# Patient Record
Sex: Female | Born: 1950 | Race: White | Hispanic: No | Marital: Married | Smoking: Never smoker
Health system: Southern US, Community
[De-identification: ages and names within clinical notes are randomized; demographics above are authoritative.]

## PROBLEM LIST (undated history)

## (undated) ENCOUNTER — Emergency Department: Admission: EM | Payer: Medicare Other | Source: Home / Self Care

## (undated) DIAGNOSIS — L309 Dermatitis, unspecified: Secondary | ICD-10-CM

## (undated) DIAGNOSIS — R011 Cardiac murmur, unspecified: Secondary | ICD-10-CM

## (undated) DIAGNOSIS — T7840XA Allergy, unspecified, initial encounter: Secondary | ICD-10-CM

## (undated) DIAGNOSIS — I10 Essential (primary) hypertension: Secondary | ICD-10-CM

## (undated) DIAGNOSIS — K219 Gastro-esophageal reflux disease without esophagitis: Secondary | ICD-10-CM

## (undated) DIAGNOSIS — F32A Depression, unspecified: Secondary | ICD-10-CM

## (undated) DIAGNOSIS — F988 Other specified behavioral and emotional disorders with onset usually occurring in childhood and adolescence: Secondary | ICD-10-CM

## (undated) DIAGNOSIS — F419 Anxiety disorder, unspecified: Secondary | ICD-10-CM

## (undated) DIAGNOSIS — I639 Cerebral infarction, unspecified: Secondary | ICD-10-CM

## (undated) DIAGNOSIS — J45909 Unspecified asthma, uncomplicated: Secondary | ICD-10-CM

## (undated) DIAGNOSIS — E785 Hyperlipidemia, unspecified: Secondary | ICD-10-CM

## (undated) DIAGNOSIS — M109 Gout, unspecified: Secondary | ICD-10-CM

## (undated) HISTORY — PX: TONSILLECTOMY: SUR1361

## (undated) HISTORY — DX: Cerebral infarction, unspecified: I63.9

## (undated) HISTORY — PX: BREAST BIOPSY: SHX20

## (undated) HISTORY — DX: Hyperlipidemia, unspecified: E78.5

## (undated) HISTORY — DX: Gastro-esophageal reflux disease without esophagitis: K21.9

## (undated) HISTORY — PX: OTHER SURGICAL HISTORY: SHX169

## (undated) HISTORY — DX: Depression, unspecified: F32.A

## (undated) HISTORY — DX: Dermatitis, unspecified: L30.9

## (undated) HISTORY — DX: Essential (primary) hypertension: I10

## (undated) HISTORY — DX: Cardiac murmur, unspecified: R01.1

## (undated) HISTORY — DX: Unspecified asthma, uncomplicated: J45.909

## (undated) HISTORY — DX: Allergy, unspecified, initial encounter: T78.40XA

## (undated) HISTORY — PX: REPLACEMENT TOTAL KNEE BILATERAL: SUR1225

## (undated) HISTORY — DX: Anxiety disorder, unspecified: F41.9

## (undated) HISTORY — DX: Other specified behavioral and emotional disorders with onset usually occurring in childhood and adolescence: F98.8

## (undated) HISTORY — DX: Gout, unspecified: M10.9

## (undated) HISTORY — PX: TOTAL KNEE ARTHROPLASTY: SHX125

---
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
## 1978-06-27 HISTORY — PX: BREAST BIOPSY: SHX20

## 1997-07-28 ENCOUNTER — Ambulatory Visit
Admit: 1997-07-28 | Disposition: A | Discharge status: Home / Self Care | Payer: Self-pay | Source: Ambulatory Visit | Admitting: Family Medicine

## 2001-09-27 ENCOUNTER — Ambulatory Visit
Admit: 2001-09-27 | Disposition: A | Discharge status: Home / Self Care | Payer: Self-pay | Source: Ambulatory Visit | Admitting: Adult Reconstructive Orthopaedic Surgery

## 2002-02-05 ENCOUNTER — Ambulatory Visit
Admit: 2002-02-05 | Disposition: A | Discharge status: Home / Self Care | Payer: Self-pay | Source: Ambulatory Visit | Admitting: Anesthesiology

## 2002-03-11 ENCOUNTER — Ambulatory Visit
Admit: 2002-03-11 | Disposition: A | Discharge status: Home / Self Care | Payer: Self-pay | Source: Ambulatory Visit | Admitting: Anesthesiology

## 2002-05-02 ENCOUNTER — Emergency Department: Admit: 2002-05-02 | Payer: Self-pay | Source: Emergency Department | Admitting: Emergency Medicine

## 2002-07-02 ENCOUNTER — Ambulatory Visit
Admit: 2002-07-02 | Disposition: A | Discharge status: Home / Self Care | Payer: Self-pay | Source: Ambulatory Visit | Admitting: Adult Reconstructive Orthopaedic Surgery

## 2003-06-12 ENCOUNTER — Ambulatory Visit: Admit: 2003-06-12 | Disposition: A | Discharge status: Home / Self Care | Payer: Self-pay | Source: Ambulatory Visit

## 2003-08-01 ENCOUNTER — Ambulatory Visit
Admit: 2003-08-01 | Disposition: A | Discharge status: Home / Self Care | Payer: Self-pay | Source: Ambulatory Visit | Admitting: Neurology

## 2005-08-07 ENCOUNTER — Emergency Department: Admit: 2005-08-07 | Payer: Self-pay | Source: Emergency Department

## 2005-08-07 LAB — BASIC METABOLIC PANEL
BUN: 19 MG/DL (ref 7–21)
CO2: 30 MEQ/L (ref 22–31)
Calcium: 9.6 MG/DL (ref 8.6–10.2)
Chloride: 100 MEQ/L (ref 98–107)
Creatinine: 1.2 MG/DL (ref 0.5–1.4)
Glucose: 93 MG/DL (ref 65–110)
Potassium: 5 MEQ/L (ref 3.6–5.0)
Sodium: 138 MEQ/L (ref 136–143)

## 2005-08-07 LAB — URINALYSIS WITH MICROSCOPIC
Bilirubin, UA: NEGATIVE
Glucose, UA: NEGATIVE
Ketones UA: NEGATIVE
Leukocyte Esterase, UA: NEGATIVE
Nitrite, UA: NEGATIVE
Protein, UR: NEGATIVE
Specific Gravity UA POCT: <=1.005 (ref <=1.030)
Urine pH: 6.5 (ref 5.0–8.0)
Urobilinogen, UA: 0.2

## 2005-08-07 LAB — CBC WITH AUTO DIFFERENTIAL CERNER
Basophils Absolute: 0.1 /mm3 (ref 0.0–0.2)
Basophils: 1 % (ref 0–2)
Eosinophils Absolute: 0.3 /mm3 (ref 0.0–0.7)
Eosinophils: 4 % (ref 0–5)
Granulocytes Absolute: 4.1 /mm3 (ref 1.8–8.1)
Hematocrit: 39.5 % (ref 37.0–47.0)
Hgb: 13.2 G/DL (ref 12.0–16.0)
Lymphocytes Absolute: 2.4 /mm3 (ref 0.5–4.4)
Lymphocytes: 32 % (ref 15–41)
MCH: 31.7 PG (ref 28.0–32.0)
MCHC: 33.5 G/DL (ref 32.0–36.0)
MCV: 95 FL (ref 80–100)
MPV: 7.4 FL (ref 7.4–10.4)
Monocytes Absolute: 0.7 /mm3 (ref 0.0–1.2)
Monocytes: 9 % (ref 0–11)
Neutrophils %: 54 % (ref 52–75)
Platelets: 318 /mm3 (ref 140–400)
RBC: 4.17 /mm3 — ABNORMAL LOW (ref 4.20–5.40)
RDW: 11.7 % (ref 11.5–15.0)
WBC: 7.6 /mm3 (ref 3.5–10.8)

## 2005-08-07 LAB — HEPATIC FUNCTION PANEL
ALT: 34 U/L (ref 4–36)
AST (SGOT): 16 U/L (ref 10–41)
Albumin/Globulin Ratio: 1.7 (ref 1.1–1.8)
Albumin: 4 G/DL (ref 3.4–4.9)
Alkaline Phosphatase: 79 U/L (ref 43–112)
Bilirubin Direct: 0 MG/DL — AB (ref 0.0–0.3)
Bilirubin Indirect: 0.6 MG/DL (ref 0.0–1.1)
Bilirubin, Total: 0.5 MG/DL (ref 0.2–1.0)
Globulin: 2.4 G/DL (ref 2.0–3.7)
Protein, Total: 6.4 G/DL (ref 6.0–8.0)

## 2005-08-08 LAB — TSH: TSH: 0.687 u[IU]/mL (ref 0.465–4.680)

## 2005-08-08 LAB — T4: T4: 5.8 ug/dL (ref 4.2–11.0)

## 2007-01-10 ENCOUNTER — Emergency Department: Admit: 2007-01-10 | Payer: Self-pay | Source: Emergency Department | Admitting: Internal Medicine

## 2007-07-23 ENCOUNTER — Inpatient Hospital Stay
Admission: RE | Admit: 2007-07-23 | Disposition: A | Discharge status: Home Health | Payer: Self-pay | Source: Ambulatory Visit | Admitting: Adult Reconstructive Orthopaedic Surgery

## 2007-07-24 LAB — BASIC METABOLIC PANEL
BUN: 10 mg/dL (ref 8–20)
CO2: 27 mEq/L (ref 21–30)
Calcium: 8.5 mg/dL — ABNORMAL LOW (ref 8.6–10.2)
Chloride: 103 mEq/L (ref 98–107)
Creatinine: 1 mg/dL (ref 0.6–1.5)
Glucose: 110 mg/dL — ABNORMAL HIGH (ref 70–100)
Potassium: 5 mEq/L (ref 3.6–5.0)
Sodium: 137 mEq/L (ref 136–146)

## 2007-07-24 LAB — TYPE AND XMATCH - IFH ONLY CERNER: AB Screen Gel: NEGATIVE

## 2007-07-24 LAB — HEMOGLOBIN AND HEMATOCRIT, BLOOD
Hematocrit: 31.2 % — ABNORMAL LOW (ref 37.0–47.0)
Hgb: 9.8 G/DL — ABNORMAL LOW (ref 12.0–16.0)

## 2007-07-24 LAB — GFR

## 2007-07-25 LAB — BASIC METABOLIC PANEL
BUN: 10 mg/dL (ref 8–20)
CO2: 25 mEq/L (ref 21–30)
Calcium: 9 mg/dL (ref 8.6–10.2)
Chloride: 104 mEq/L (ref 98–107)
Creatinine: 1 mg/dL (ref 0.6–1.5)
Glucose: 85 mg/dL (ref 70–100)
Potassium: 4.1 mEq/L (ref 3.6–5.0)
Sodium: 139 mEq/L (ref 136–146)

## 2007-07-25 LAB — HEMOGLOBIN AND HEMATOCRIT, BLOOD
Hematocrit: 30.1 % — ABNORMAL LOW (ref 37.0–47.0)
Hgb: 9.5 G/DL — ABNORMAL LOW (ref 12.0–16.0)

## 2007-07-25 LAB — GFR

## 2007-07-26 LAB — HEMOGLOBIN AND HEMATOCRIT, BLOOD
Hematocrit: 28.2 % — ABNORMAL LOW (ref 37.0–47.0)
Hgb: 9.1 G/DL — ABNORMAL LOW (ref 12.0–16.0)

## 2007-08-07 ENCOUNTER — Emergency Department: Admit: 2007-08-07 | Payer: Self-pay | Source: Emergency Department | Admitting: Emergency Medicine

## 2007-08-07 LAB — MANUAL DIFFERENTIAL CERNER
Bands: 1 % (ref 0–9)
Lymphocytes Manual: 17 % (ref 15–41)
Monocytes Manual: 14 % — ABNORMAL HIGH (ref 0–11)
Neutrophils %: 68 % (ref 52–75)
RBC Morphology: NORMAL

## 2007-08-07 LAB — CBC AND DIFFERENTIAL
Hematocrit: 33 % — ABNORMAL LOW (ref 37.0–47.0)
Hgb: 10.8 G/DL — ABNORMAL LOW (ref 12.0–16.0)
MCH: 32.4 PG — ABNORMAL HIGH (ref 28.0–32.0)
MCHC: 32.7 G/DL (ref 32.0–36.0)
MCV: 99.1 FL (ref 80.0–100.0)
MPV: 9.1 FL — ABNORMAL LOW (ref 9.4–12.3)
Platelets: 433 /mm3 — ABNORMAL HIGH (ref 140–400)
RBC: 3.33 /mm3 — ABNORMAL LOW (ref 4.20–5.40)
RDW: 12.6 % (ref 11.5–15.0)
WBC: 6.43 /mm3 (ref 3.50–10.80)

## 2007-08-07 LAB — BASIC METABOLIC PANEL
BUN: 18 MG/DL (ref 7–21)
CO2: 30 MEQ/L (ref 22–31)
Calcium: 9.5 MG/DL (ref 8.6–10.2)
Chloride: 97 MEQ/L — ABNORMAL LOW (ref 98–107)
Creatinine: 1.1 MG/DL (ref 0.5–1.4)
Glucose: 86 MG/DL (ref 65–110)
Potassium: 4.3 MEQ/L (ref 3.6–5.0)
Sodium: 137 MEQ/L (ref 136–143)

## 2008-10-09 ENCOUNTER — Ambulatory Visit
Admit: 2008-10-09 | Disposition: A | Discharge status: Home / Self Care | Payer: Self-pay | Source: Ambulatory Visit | Admitting: Adult Reconstructive Orthopaedic Surgery

## 2009-01-13 LAB — TYPE AND SCREEN: AB Screen Gel: NEGATIVE

## 2009-01-19 ENCOUNTER — Inpatient Hospital Stay
Admission: RE | Admit: 2009-01-19 | Disposition: A | Discharge status: Home Health | Payer: Self-pay | Source: Ambulatory Visit | Admitting: Adult Reconstructive Orthopaedic Surgery

## 2009-01-19 LAB — URINALYSIS
Bilirubin, UA: NEGATIVE
Glucose, UA: NEGATIVE
Ketones UA: NEGATIVE
Leukocyte Esterase, UA: NEGATIVE
Nitrite, UA: NEGATIVE
Specific Gravity UA POCT: 1.025 (ref 1.001–1.035)
Urine pH: 6 (ref 5.0–8.0)
Urobilinogen, UA: NORMAL mg/dL

## 2009-01-20 LAB — BASIC METABOLIC PANEL
BUN: 13 mg/dL (ref 8–20)
CO2: 26 mEq/L (ref 21–30)
Calcium: 8.7 mg/dL (ref 8.6–10.2)
Chloride: 101 mEq/L (ref 98–107)
Creatinine: 0.7 mg/dL (ref 0.6–1.5)
Glucose: 125 mg/dL — ABNORMAL HIGH (ref 70–100)
Potassium: 4.7 mEq/L (ref 3.6–5.0)
Sodium: 134 mEq/L — ABNORMAL LOW (ref 136–146)

## 2009-01-20 LAB — HEMOGLOBIN AND HEMATOCRIT, BLOOD
Hematocrit: 31 % — ABNORMAL LOW (ref 37.0–47.0)
Hgb: 10.1 G/DL — ABNORMAL LOW (ref 12.0–16.0)

## 2009-01-20 LAB — GFR

## 2009-01-21 LAB — HEMOGLOBIN AND HEMATOCRIT, BLOOD
Hematocrit: 33.8 % — ABNORMAL LOW (ref 37.0–47.0)
Hgb: 10.7 G/DL — ABNORMAL LOW (ref 12.0–16.0)

## 2009-01-21 LAB — BASIC METABOLIC PANEL
BUN: 9 mg/dL (ref 8–20)
CO2: 30 mEq/L (ref 21–30)
Calcium: 9.3 mg/dL (ref 8.6–10.2)
Chloride: 104 mEq/L (ref 98–107)
Creatinine: 0.9 mg/dL (ref 0.6–1.5)
Glucose: 102 mg/dL — ABNORMAL HIGH (ref 70–100)
Potassium: 4.4 mEq/L (ref 3.6–5.0)
Sodium: 143 mEq/L (ref 136–146)

## 2009-01-21 LAB — GFR

## 2009-02-08 ENCOUNTER — Emergency Department: Admit: 2009-02-08 | Payer: Self-pay | Source: Emergency Department | Admitting: Emergency Medicine

## 2009-02-08 LAB — URINALYSIS WITH MICROSCOPIC
Bilirubin, UA: NEGATIVE
Glucose, UA: NEGATIVE
Ketones UA: NEGATIVE
Leukocyte Esterase, UA: NEGATIVE
Nitrite, UA: NEGATIVE
Protein, UR: NEGATIVE
Specific Gravity UA POCT: 1.01 (ref <=1.030)
Urine pH: 5.5 (ref 5.0–8.0)
Urobilinogen, UA: 0.2

## 2009-02-08 LAB — CBC AND DIFFERENTIAL
Basophils Absolute: 0 /mm3 (ref 0.0–0.2)
Basophils: 1 % (ref 0–2)
Eosinophils Absolute: 0.1 /mm3 (ref 0.0–0.7)
Eosinophils: 1 % (ref 0–5)
Granulocytes Absolute: 4.2 /mm3 (ref 1.8–8.1)
Hematocrit: 37.2 % (ref 37.0–47.0)
Hgb: 12.4 G/DL (ref 12.0–16.0)
Lymphocytes Absolute: 1 /mm3 (ref 0.5–4.4)
Lymphocytes: 17 % (ref 15–41)
MCH: 31.6 PG (ref 28.0–32.0)
MCHC: 33.3 G/DL (ref 32.0–36.0)
MCV: 94.7 FL (ref 80.0–100.0)
MPV: 9.3 FL — ABNORMAL LOW (ref 9.4–12.3)
Monocytes Absolute: 0.6 /mm3 (ref 0.0–1.2)
Monocytes: 11 % (ref 0–11)
Neutrophils %: 71 % (ref 52–75)
Platelets: 440 /mm3 — ABNORMAL HIGH (ref 140–400)
RBC: 3.93 /mm3 — ABNORMAL LOW (ref 4.20–5.40)
RDW: 12.5 % (ref 11.5–15.0)
WBC: 5.93 /mm3 (ref 3.50–10.80)

## 2009-02-08 LAB — COMPREHENSIVE METABOLIC PANEL
ALT: 17 U/L (ref 4–36)
AST (SGOT): 20 U/L (ref 10–41)
Albumin/Globulin Ratio: 1.2 (ref 1.1–1.8)
Albumin: 3.8 G/DL (ref 3.4–4.9)
Alkaline Phosphatase: 95 U/L (ref 43–112)
BUN: 11 MG/DL (ref 7–21)
Bilirubin, Total: 0.6 MG/DL (ref 0.2–1.0)
CO2: 25 MEQ/L (ref 22–31)
Calcium: 10.1 MG/DL (ref 8.6–10.2)
Chloride: 102 MEQ/L (ref 98–107)
Creatinine: 1 MG/DL (ref 0.5–1.4)
Globulin: 3.2 G/DL (ref 2.0–3.7)
Glucose: 125 MG/DL — ABNORMAL HIGH (ref 65–110)
Potassium: 3.8 MEQ/L (ref 3.6–5.0)
Protein, Total: 7 G/DL (ref 6.0–8.0)
Sodium: 140 MEQ/L (ref 136–143)

## 2009-02-08 LAB — AMYLASE: Amylase: 53 U/L (ref 30–110)

## 2009-02-08 LAB — LIPASE: Lipase: 302 U/L — ABNORMAL HIGH (ref 23–300)

## 2009-11-19 ENCOUNTER — Emergency Department: Admit: 2009-11-19 | Payer: Self-pay | Source: Emergency Department | Admitting: Emergency Medicine

## 2011-03-22 LAB — ECG 12-LEAD
Atrial Rate: 82 {beats}/min
P Axis: 34 degrees
P-R Interval: 116 ms
Q-T Interval: 370 ms
QRS Duration: 70 ms
QTC Calculation (Bezet): 432 ms
R Axis: 30 degrees
T Axis: 29 degrees
Ventricular Rate: 82 {beats}/min

## 2011-04-09 LAB — ECG 12-LEAD
Atrial Rate: 73 {beats}/min
P Axis: 51 degrees
P-R Interval: 114 ms
Q-T Interval: 358 ms
QRS Duration: 72 ms
QTC Calculation (Bezet): 394 ms
R Axis: 30 degrees
T Axis: 50 degrees
Ventricular Rate: 73 {beats}/min

## 2011-04-15 NOTE — Discharge Summary (Unsigned)
DATE OF BIRTH:                        1950/08/07            ADMISSION DATE:                     07/23/2007      DISCHARGE DATE:                     07/26/2007            ATTENDING PHYSICIAN:                  Lou Miner, MD            ADMISSION DIAGNOSIS:  Osteoarthritis of the left knee.            DISCHARGE DIAGNOSIS:  Osteoarthritis of the left knee.            OPERATION:  Knee replacement arthroplasty.            BRIEF SUMMARY:  This 60 year old woman suffered progressive difficulties      with her left knee, which was not responsive to conservative management and      was diagnosed as osteoarthritis.  The patient was admitted to the hospital      for elective knee replacement, which was carried out as detailed in the      operative summary on 07/23/2007.  The patient did well postoperatively, and      was mobilized.  Her pain was brought under control with oral medications.      She was stable metabolically and was comfortable with self-administration      of Lovenox.  Dressing change revealed early satisfactory healing of her      wound on the third postoperative day.  She was therefore discharged home,      ambulatory and comfortable.  The patient remains on Lovenox for a total of      10 days.  She will be seen at the office 2 weeks from surgery for suture      removal.                                          ___________________________________          Date Signed: __________      Lou Miner, MD  ([id number])            D: 07/26/2007 by Lou Miner, MD      T: 07/27/2007 by [id number] ([id number]) (Dorris Carnes: [id number])      cc:  Lou Miner, MD

## 2011-04-15 NOTE — Discharge Summary (Unsigned)
Account Number: [id number]      Document ID: [id number]      Admit Date: 01/19/2009      Discharge Date: 01/21/2009            ATTENDING PHYSICIAN:  Sinclair Grooms, MD                  PREOPERATIVE DIAGNOSIS:      Osteoarthritis, right knee.            POSTOPERATIVE DIAGNOSIS:      Osteoarthritis, right knee.            HOSPITAL COURSE:      This is a 60 year old female admitted to the orthopedic floor after right      total knee replacement.  She progressed well with physical therapy starting      on postoperative day #0.  Her Foley was discontinued on postoperative day      #1, and she was started on anticoagulation that day.  On postoperative day      #2, she was deemed safe and suitable for discharge home with home physical      therapy.  She will follow up with Dr. Lenell Antu in 1 to 2 weeks.            FINAL DIAGNOSIS:      Osteoarthritis, right knee, requiring total knee replacement.                              _______________________________     Date/Time Signed: _____________      Lou Miner MD [phone])            D:  02/11/2009 22:00 PM by Bryon Lions, MD ([id number])      T:  02/12/2009 10:55 AM by [id number]          Everlean Cherry: [id number]) (Doc ID: [id number])                        NU:[id number] Kendra Grissett MD

## 2011-04-15 NOTE — Op Note (Unsigned)
DATE OF BIRTH:                        [birth year]      ADMISSION DATE:                     07/23/2007            PATIENT LOCATION:                    [id number]            DATE OF PROCEDURE:                   07/23/2007      SURGEON:                            Lou Miner, MD      ASSISTANT(S):                         Jacklynn Lewis, MD                  PREOPERATIVE DIAGNOSIS:   OSTEOARTHRITIS OF THE LEFT KNEE.            POSTOPERATIVE DIAGNOSIS:  OSTEOARTHRITIS OF THE LEFT KNEE.            PROCEDURE:  KNEE REPLACEMENT ARTHROPLASTY.            DESCRIPTION OF PROCEDURE:  Under satisfactory spinal anesthesia, the      patient was prepped and draped in the usual fashion.  Preoperative      administration of antibiotics was confirmed and the pause was carried out.      The procedure was begun after a suitable prep and drape.  A central      incision was used and a median parapatellar approach to the knee exposed      the joint.  The patient had extensive osteoarthritic changes.  These were      most severe and focused in the patellofemoral joint, but were visible      throughout the knee in the form of degenerative changes on the surface      cartilage, well seen to the naked eye.  Soft tissues were cleared from the      top of the tibia.  Retractors were placed and the center of the tibia was      found with a drill.  A guide rod was placed down the tibia and the tibial      cut was made.  The edges were trimmed so that it was satisfactorily      cleared.  The knee was then reduced and the distal femur was then also cut      transversely at the 0 indicator in 5-degrees of valgus after the center of      the femur was found with medullary rod.  This was checked.  This revealed      satisfactory position of the joint and satisfactory gap for implantation of      the prosthesis.  The distal femur was then shaped appropriately to a 60-mm      size and a trial prosthesis was put in place.  A 67-mm trial tibia was       selected and was adjusted for appropriate rotation.  With this done, the patellofemoral joint was addressed.  The patella      measured about 18-mm in width.  It was thought this was due in part to      erosive changes as seen on the x-ray.  We therefore trimmed the patella to      a flat surface and its low point was not deepened.  This resulted in a      patella of about 13 to 14 mm in width and a satisfactory level contour.  It      was sized for a small prosthesis and was shaped appropriately.  The      retained cortex, which was free of any cartilage, was drilled with a drill      and this was to allow for better implantation with the cement.            With this done, copious irrigation was carried out and the tibia was once      again exposed.  The preparation of the tibia for a 67-mm prosthesis was      completed by using the punch to place the fins and the cement was mixed and      the prosthesis was gathered on the back table.  The prosthesis was      implanted tibia, femur, patella.  A 12-mm tray was used to cure the cement      with the knee in extension and, with this done, the knee was checked.  It      worked best with a 10-mm tray.  We therefore selected that as our permanent      implant.  This was put in and locked in position and the wound was closed      in layers in the usual fashion over a Hemovac drain.  The patient was then      placed in a sterile dressing and was transferred to the recovery room in      satisfactory condition.  She tolerated the procedure well.                                          ___________________________________          Date Signed: __________      Lou Miner, MD  ([id number])            D: 07/23/2007 by Lou Miner, MD      T: 07/23/2007 by [id number] ([id number]) (V:[id number])      cc:  Lou Miner, MD

## 2011-04-15 NOTE — Op Note (Unsigned)
Account Number: [id number]      Document ID: [id number]      Admit Date: 01/19/2009      Procedure Date: 01/19/2009            Patient Location: [id number]      Patient Type: I            SURGEON: Lou Miner MD      ASSISTANT:  Bryon Lions MD                  PREOPERATIVE DIAGNOSIS:      Osteoarthritis of the right knee.            POSTOPERATIVE DIAGNOSIS:      Osteoarthritis of the right knee.            PROCEDURE PERFORMED:      Right knee arthroplasty.            DESCRIPTION OF PROCEDURE:      Under satisfactory spinal anesthesia, the patient was prepped and draped in      the usual fashion.  Preoperative administration of antibiotics was      confirmed and a pause was carried out.  The procedure was begun after      suitable prep and drape.  A central incision was used and a median      parapatellar approach to the knee exposed the joint.  There was evidence of      extensive osteoarthritic changes especially about the patellofemoral joint.       The soft tissues were cleared from the top of the tibia, retractors were      placed and the center of the tibia was found with the drill.  A guide wire      was placed on the tibia and the tibial cut was made.  The edges were      trimmed so that it was satisfactorily cleared.  The knee was then reduced      and the distal femur was also cut transversely at the zero indicator and 5      degrees of valgus after the center of the femur was found the medullary      rod.  This revealed satisfactory position of the joint.  The distal femur      was then shaped appropriately to a 60-mm size and a trial prosthesis was      put in place.  A 67-mm tibial trial was selected and adjusted for      appropriate rotation.  With this done, the patellofemoral joint was      addressed. The patella was measured about 19 mm in width.  It was thought      that this was due, in part, to the erosive changes.  The patella was then      trimmed to a flat surface.  This resulted in a patella of  about 13 to 13 mm      in width and a satisfactory level contour.  It was sized for a small      prosthesis and was shaved appropriately.  The remaining cortex was drilled      and this allowed for better implantation with the cement.  At this point,      the joint was copiously irrigated and all bony surfaces were washed clean.      At this point, the tibia was, once again, exposed.  The tibia was prepared      for  a 67-mm prosthesis.  Cement was mixed.  The cement as well as the      prosthesis was implanted in the tibia, femur and patella.  A 12-mm tray was      used to cure the cement with the knee in extension and _____ and it worked      best with a 12-mm tray; that was selected as the permanent implant.  This      was put in and locked.  The wound was closed in layers in the usual      fashion.  The patient was then dressed in a sterile dressing and      transferred to the recovery room in satisfactory condition.  She tolerated      the procedure well.                              _______________________________     Date/Time Signed: _____________      Lou Miner MD [phone])            D:  01/19/2009 14:54 PM by Bryon Lions, MD ([id number])      T:  01/20/2009 09:02 AM by BP          (Conf: [id number]) (Doc ID: [id number])                  UU:[id number] Breniya Goertzen MD

## 2011-10-26 ENCOUNTER — Other Ambulatory Visit: Payer: Self-pay | Admitting: Specialist

## 2011-10-26 DIAGNOSIS — R410 Disorientation, unspecified: Secondary | ICD-10-CM

## 2011-10-29 ENCOUNTER — Ambulatory Visit
Admission: RE | Admit: 2011-10-29 | Discharge: 2011-10-29 | Disposition: A | Discharge status: Home / Self Care | Payer: BLUE CROSS/BLUE SHIELD | Source: Ambulatory Visit | Attending: Specialist | Admitting: Specialist

## 2011-10-29 DIAGNOSIS — R410 Disorientation, unspecified: Secondary | ICD-10-CM

## 2011-11-12 ENCOUNTER — Ambulatory Visit
Admission: RE | Admit: 2011-11-12 | Discharge: 2011-11-12 | Disposition: A | Discharge status: Home / Self Care | Payer: BLUE CROSS/BLUE SHIELD | Source: Ambulatory Visit | Attending: Specialist | Admitting: Specialist

## 2011-11-12 DIAGNOSIS — F29 Unspecified psychosis not due to a substance or known physiological condition: Secondary | ICD-10-CM | POA: Insufficient documentation

## 2012-03-07 ENCOUNTER — Ambulatory Visit (INDEPENDENT_AMBULATORY_CARE_PROVIDER_SITE_OTHER): Payer: BLUE CROSS/BLUE SHIELD | Admitting: Cardiovascular Disease

## 2012-03-07 ENCOUNTER — Encounter (INDEPENDENT_AMBULATORY_CARE_PROVIDER_SITE_OTHER): Payer: Self-pay | Admitting: Cardiovascular Disease

## 2012-03-07 VITALS — BP 122/70 | HR 80 | Ht 62.0 in | Wt 185.0 lb

## 2012-03-07 DIAGNOSIS — R0602 Shortness of breath: Secondary | ICD-10-CM | POA: Insufficient documentation

## 2012-03-07 NOTE — Progress Notes (Signed)
------------------------------------------------------------------------  Cardiology Outpatient Consult Note  ------------------------------------------------------------------------    Chief Complaint   Patient presents with   . Shortness of Breath   . Fatigue       Referring Physician: Sheran Luz, MD     Date of Consultation: 03/07/2012      OZH:YQMVHQI OF PRESENT ILLNESS:  Rachel Diaz is a 61 year old female referred by Dr. Shayne Diaz for new  excessive shortness of breath and fatigue.  The patient states that she is  now exhausted all the time.  This has been going on for a year.  It occurs  on a daily basis.  She has dyspnea with minimal exertion.  She has never  had typical angina.  She does not have exertional chest pain or chest  pressure.  She has not had PND, orthopnea, or lower extremity edema.  She  has never had symptoms of dysrhythmias.  She does not exercise on a regular  basis.     SOCIAL HISTORY:  She is married.  She is a social drinker.  She has never smoked or used  drugs.  She is a retired Engineer, site.  She used to teach math, now she  is a Lawyer.     FAMILY HISTORY:  Remarkable for her father having an MI in his 74s and bypass surgery.   Mother also had heart attack and she has had longstanding obesity and  hypercholesterolemia.  There is no history of hypertension, diabetes, or  tobacco abuse.  She states that this fatigue has become worse since  stopping her Adderall that she is on for DVT prophylaxis.      Cardiac risk factors include:  positive family history and hypercholesterolemia  Review of Systems:  All review of systems were reviewed unless stated as  above.    Past Medical History   Diagnosis Date   . Hyperlipidemia        Past Surgical History   Procedure Date   . Tonsillectomy    . Breast biopsy    . Total knee arthroplasty B/L 2009, 2010       History     Social History   . Marital Status: Married     Spouse Name: N/A     Number of Children: N/A   . Years of Education: N/A     Occupational History   . Not on file.     Social History Main Topics   . Smoking status: Never Smoker    . Smokeless tobacco: Not on file   . Alcohol Use: Yes   . Drug Use: No   . Sexually Active: Not on file     Other Topics Concern   . Not on file     Social History Narrative   . No narrative on file       Family History   Problem Relation Age of Onset   . Heart failure Mother    . Heart failure Father    . CABG Father        Allergies   Allergen Reactions   . Morphine      Mental changes      . Norflex      Presyncope    . Percocet (Oxycodone-Acetaminophen)        Current outpatient prescriptions:celecoxib (CELEBREX) 200 MG capsule, Active, Take 200 mg by mouth daily., Disp: , Rfl: ;  Norethindrone-Eth Estradiol (FEMHRT LOW DOSE PO), Active, Take by mouth., Disp: , Rfl: ;  omeprazole (PRILOSEC) 20 MG capsule, Active, Take 20 mg by mouth daily., Disp: , Rfl:       Physical Examination:  Filed Vitals:    03/07/12 1353   BP: 122/70   Pulse: 80     General: In NAD, well developed, well nourished.  HEENT: sclera is normal, mucous membranes are moist, neck is supple. No carotid bruits  Cardiovascular: S1 S2 heard, regular rate and rhythm, without murmurs, rubs or gallops  Lungs: Clear to auscultation b/l, normal excursion, no rhonchi, rales or wheezes b/l  Abdomen: +bs, soft, nontender, non-distended, no organomegaly   Extremities: no edema, 2+ pulses b/l in upper and lower extremities  Skin: no rashes or lesions noted  Neurological: nonfocal, moves all extremities well  Psychiatric: alert and oriented x 3, normal affect  Lymphatics: no lymphadenopathy        Impression and  Recommendations:  Rachel Diaz is a 61 y.o. who presents for consultation regarding fatigue and SOB    1. SOB (shortness of breath)  ECG 12 lead (Today)       EKG today showed NSR    ASSESSMENT AND PLAN:   Mrs. Bourn has risk factors for coronary disease to include age, obesity,  hypercholesterolemia, and a strong family history.  Given that her baseline  EKG is normal, she will be scheduled for routine Bruce protocol treadmill  to evaluate if any of shortness of breath represents ischemia.  Any further  recommendations will depend on the results.        Thank you for allowing [place] to participate in this patient's care. Please call me with any questions.    Sylvan Cheese, M.D., [facility]

## 2012-03-20 ENCOUNTER — Encounter (INDEPENDENT_AMBULATORY_CARE_PROVIDER_SITE_OTHER): Payer: BLUE CROSS/BLUE SHIELD

## 2012-04-05 ENCOUNTER — Encounter (INDEPENDENT_AMBULATORY_CARE_PROVIDER_SITE_OTHER): Payer: Self-pay

## 2012-04-05 ENCOUNTER — Ambulatory Visit (INDEPENDENT_AMBULATORY_CARE_PROVIDER_SITE_OTHER): Payer: BLUE CROSS/BLUE SHIELD | Admitting: Interventional Cardiology

## 2012-04-05 VITALS — BP 130/66 | HR 90

## 2012-04-05 NOTE — Procedures (Signed)
Girtha Hake, MD  61 y.o.   04/05/2012    Risk Factors:    Tobacco: No    Caffeine: Yes 1-2/day  Hypertension: No   Alcohol: No  Rheumatic Fever: No  Menses: No  Family History: Yes father   Gout: No  Diabetes: No    Lipids: Yes    Present History:  Minimal exercise    Related Medications:  no    Stress Test:  Total Time: 5:56 mins.    Arrhythmias:  no

## 2012-04-05 NOTE — Procedures (Signed)
Rachel Diaz came to our office today for an exercise treadmill study. She is a 61 y.o. with history of shortness of breath.    Rachel Diaz was exercised today according to the standard BRUCE protocol. Baseline blood pressure was 130/42mmHg. Maximum heart rate was 157bpm. Patient ran for 5:56 minutes into stage 2. Test was terminated secondary to fatigue and shortness of breath. Patient did not experience angina during exercise. Maximum heart rate obtained was 157bpm which is 100% of the maximum predicted heart rate for age. Peak blood pressure was 160/74mmHg.    The EKG at rest showed sinus rhythm with nonspecific ST T wave abnormalities. There were no ischemic EKG changes during/after exercise. There were no arrhythmias noted.     In summary, Rachel Diaz had a normal stress test today. Based on this I would recommend continuing risk factor modification for coronary artery disease.    Please feel free to call if there are any questions on her cardiac status.    Alvira Philips. Johny Drilling, M.D.

## 2014-04-24 ENCOUNTER — Encounter (INDEPENDENT_AMBULATORY_CARE_PROVIDER_SITE_OTHER): Payer: Self-pay

## 2014-04-24 ENCOUNTER — Ambulatory Visit (INDEPENDENT_AMBULATORY_CARE_PROVIDER_SITE_OTHER): Payer: Medicare Other | Admitting: Cardiovascular Disease

## 2014-04-24 DIAGNOSIS — E785 Hyperlipidemia, unspecified: Secondary | ICD-10-CM | POA: Insufficient documentation

## 2014-04-24 DIAGNOSIS — Z8249 Family history of ischemic heart disease and other diseases of the circulatory system: Secondary | ICD-10-CM | POA: Insufficient documentation

## 2014-04-24 DIAGNOSIS — R0602 Shortness of breath: Secondary | ICD-10-CM | POA: Insufficient documentation

## 2014-04-24 NOTE — Procedures (Signed)
--------------------------------------------------------------------------------------------    Cardiology Outpatient Exercise Stress Test   --------------------------------------------------------------------------------------------    Name:  TAMELA, ELSAYED       DOB: 01-Sep-1950        Test Date:  04/24/2014         Referring Physician: Sheran Luz, MD    Performing Physician: Sylvan Cheese,  MD [facility]    Indications/symptoms:  H/o DOE with increasing episodes of SOB within past few months        Current exercise routine:  Walking and some stretching exercising but minimal       Resting ECG: Normal sinus rhythm, no significant ST changes.    Heart Rate:     Rest = 95bpm  Maximum = 155bpm (100% MPHR).    Blood Pressure:  Rest = 125/83  Maximum = 180/80    Patient Symptoms:  DOE in first stage    Standard Bruce protocol exercise time:  5:10 minutes    Exercise workload:  7.0 METS    Reason for ending:  achieved target heart rate, fatigue and shortness of breath    Ischemic ST changes at peak exercise:  no    Arrhythmias: none    CONCLUSION:     1. No clinical or electrocardiographic evidence of exercise-induced myocardial ischemia at the heart rate achieved.  2. Hypertensive blood pressure response to exercise   3. below average exercise capacity for age and gender.  4.  The stress test demonstrated poor physical conditioning and a hypertensive blood pressure response to exercise.  There was, however, no evidence for ischemia by EKG at a good product.  Based on this, the patient was counseled to start an exercise, diet, and weight reduction program.  She was counseled to repeat the study in approximately 6 months to look for any improvement.      Signed KG:[id number] Dareen Piano, MD [facility]

## 2014-05-02 ENCOUNTER — Ambulatory Visit (INDEPENDENT_AMBULATORY_CARE_PROVIDER_SITE_OTHER): Payer: Medicare Other | Admitting: Cardiovascular Disease

## 2014-05-02 DIAGNOSIS — E785 Hyperlipidemia, unspecified: Secondary | ICD-10-CM

## 2014-05-02 DIAGNOSIS — R0602 Shortness of breath: Secondary | ICD-10-CM

## 2014-05-02 DIAGNOSIS — Z8249 Family history of ischemic heart disease and other diseases of the circulatory system: Secondary | ICD-10-CM

## 2014-05-02 NOTE — Procedures (Signed)
ECHOCARDIOGRAM REPORT    Name: Rachel Diaz Age: 63 y.o. Gender: female Date: 05/02/2014   Clinical history: Shortness of Breath Height:  62in Weight: 200 lb   Physician: Vania Rea. Dareen Piano, M.D. cc: Sheran Luz, MD   Quality of study: Good Tech: KB BP 125/83 DVD Number: Dr Darlin Coco #20     M-MODE AND 2-D DIMENSIONS  Patient values  Normal Values  Patient Values  Normal Values   Aortic Root: 28 mm 20-37 mm RVIDd 32 mm 7-43 mm   Aortic Leaflet Separation: 18 mm 15-26 mm IVSDd 11 mm 6-11 mm   Left Atrium: 33 mm 18-40 mm LVPWDd 10 mm 6-11 mm   IVC 19 mm  LVIDd 40 mm 37-56 mm       LVIDs 25 mm 20-40 mm   TDI 5.9  < 10 FS%  % 28-45%   Ascending Aorta 26 mm < 3.7 mm EF% 60-65 % 50-75%     DOPPLER/HEMODYNAMIC ANALYSIS  MV Inflow:   normal Decel Time:     Estimated RVSP: 36mm Hg TR Vmax 2.27m/sec RAP estim: 5 mm Hg     Valve Pk Vel. Normal (m/sec) Regurgitation Area (cm2) Mn Grad. Pk Grad. LVOT   Aortic  1.2 1-1.7  none           Mitral  0.8 0.6-1.3  none           Tricuspid  0.6 0.3-0.7  mild           Pulmonic  1.2 0.6-1.0  trace           LVOT  1.1 0.7-1.7                 FINAL IMPRESSIONS:  1. Left Ventricle: Normal left ventricular size, thickness, and function with an EF of 60-65% with no regional wall motion abnormalities.  2. Right Ventricle: Normal size and function.   3. Pulmonary Arteries: Normal pulmonary artery pressures with an estimated RVSP of 36mm Hg.  4. Left Atrium: normal size   5. Right Atrium: normal size  6. Aortic Valve: trileaflet, opens well with no stenosis or regurgitation.  7. Mitral Valve: normal structure and function with no stenosis or regurgitation.  8. Tricuspid Valve: normal in morphology with mild regurgitation.  9. Pulmonic Valve: Trace PI by Doppler.  10. Pericardium: no pericardial effusion.   11. IVC: normal size. Normal respiratory changes.  12. Aorta: normal aortic root size, aortic arch normal size.  13. Diastolic Function: Tissue Doppler velocities and mitral inflow pattern  consistent with normal diastolic function.  14. No old studies for serial comparison. No etiology for SOB identified    Vania Rea. Dareen Piano, M.D., [facility]

## 2014-05-08 ENCOUNTER — Telehealth (INDEPENDENT_AMBULATORY_CARE_PROVIDER_SITE_OTHER): Payer: Self-pay | Admitting: Cardiovascular Disease

## 2014-05-08 NOTE — Telephone Encounter (Signed)
Patient calling for echo results.    [phone]  Home ok to leave detailed message.

## 2014-10-24 ENCOUNTER — Encounter (INDEPENDENT_AMBULATORY_CARE_PROVIDER_SITE_OTHER): Payer: Medicare Other

## 2014-11-14 ENCOUNTER — Ambulatory Visit (INDEPENDENT_AMBULATORY_CARE_PROVIDER_SITE_OTHER): Payer: Medicare Other | Admitting: Cardiovascular Disease

## 2014-11-14 ENCOUNTER — Encounter (INDEPENDENT_AMBULATORY_CARE_PROVIDER_SITE_OTHER): Payer: Self-pay

## 2014-11-14 VITALS — BP 124/80 | HR 85 | Ht 62.0 in | Wt 184.0 lb

## 2014-11-14 DIAGNOSIS — R0602 Shortness of breath: Secondary | ICD-10-CM

## 2014-11-14 DIAGNOSIS — E785 Hyperlipidemia, unspecified: Secondary | ICD-10-CM

## 2014-11-14 DIAGNOSIS — Z8249 Family history of ischemic heart disease and other diseases of the circulatory system: Secondary | ICD-10-CM

## 2014-11-14 NOTE — Procedures (Signed)
--------------------------------------------------------------------------------------------    Cardiology Outpatient Exercise Stress Test   --------------------------------------------------------------------------------------------    Name:  Rachel Diaz, Rachel Diaz       DOB: 1951/02/28        Test Date:  11/14/2014         Referring Physician: Sheran Luz, MD    Performing Physician: Sylvan Cheese,  MD [facility]    Indications/symptoms:   Hx of DOE and SOB, Family hx of CAD & MI      Current exercise routine:  Not exercising currently, does physical therapy for arms       Resting ECG: Normal sinus rhythm, no significant ST changes    Heart Rate:     Rest = 85bpm  Maximum = 154bpm (100% MPHR).    Blood Pressure:  Rest = 124/80  Maximum = 230/60    Patient Symptoms:  Fatigue    Standard Bruce protocol exercise time:  6:06 minutes    Exercise workload:  7.1 METS    Reason for ending:  achieved target heart rate, fatigue and shortness of breath    Ischemic ST changes at peak exercise:  no    Arrhythmias: none    CONCLUSION:     1. No clinical or electrocardiographic evidence of exercise-induced myocardial ischemia at the heart rate achieved.  2. Hypertensive blood pressure response to exercise   3. average exercise capacity for age and gender.  4.  Improved exercise tolerance compared to last year ,  continue risk factor modification at this time      Signed WG:[id number] Dareen Piano, MD [facility]

## 2014-11-16 ENCOUNTER — Emergency Department
Admission: EM | Admit: 2014-11-16 | Discharge: 2014-11-16 | Disposition: A | Discharge status: Home / Self Care | Payer: Medicare Other | Attending: Emergency Medical Services | Admitting: Emergency Medical Services

## 2014-11-16 ENCOUNTER — Emergency Department: Payer: Medicare Other

## 2014-11-16 DIAGNOSIS — R600 Localized edema: Secondary | ICD-10-CM | POA: Insufficient documentation

## 2014-11-16 DIAGNOSIS — E785 Hyperlipidemia, unspecified: Secondary | ICD-10-CM | POA: Insufficient documentation

## 2014-11-16 LAB — BASIC METABOLIC PANEL
Anion Gap: 8 (ref 5.0–15.0)
BUN: 15 mg/dL (ref 7–19)
CO2: 30 mEq/L — ABNORMAL HIGH (ref 22–29)
Calcium: 10 mg/dL (ref 8.5–10.5)
Chloride: 104 mEq/L (ref 100–111)
Creatinine: 1 mg/dL (ref 0.6–1.0)
Glucose: 96 mg/dL (ref 70–100)
Potassium: 4 mEq/L (ref 3.5–5.1)
Sodium: 142 mEq/L (ref 136–145)

## 2014-11-16 LAB — GFR: EGFR: 55.4

## 2014-11-16 MED ORDER — FUROSEMIDE 20 MG PO TABS
20.0000 mg | ORAL_TABLET | Freq: Every day | ORAL | Status: DC
Start: 2014-11-16 — End: 2019-08-29

## 2014-11-16 NOTE — Discharge Instructions (Signed)
Dear Ms. Rachel Diaz:    I appreciate your choosing the [facility] for your healthcare needs, and hope your visit today was EXCELLENT.    Instructions:  Please follow-up with Dr. Darcel Bayley within the next 10 days.    Return to the Emergency Department for any worsening symptoms or concerns.    Below is some information that our patients often find helpful.    We wish you good health and please do not hesitate to contact us if we can ever be of any assistance.    Sincerely,  Clovis Riley, MD  [facility]    ________________________________________________________________    If you do not continue to improve or your condition worsens, please contact your doctor or return immediately to the Emergency Department.    Thank you for choosing [facility] for your emergency care needs.  We strive to provide EXCELLENT care to you and your family.      DOCTOR REFERRALS  Call [phone] if you need any further referrals and we can help you find a primary care doctor or specialist.  Also, available online at:  [email or web address]    YOUR CONTACT INFORMATION  Before leaving please check with registration to make sure we have an up-to-date contact number.  You can call registration at [phone] to update your information.  For questions about your hospital bill, please call [phone].  For questions about your Emergency Dept Physician bill please call [phone].      FREE HEALTH SERVICES  If you need help with health or social services, please call 2-1-1 for a free referral to resources in your area.  2-1-1 is a free service connecting people with information on health insurance, free clinics, pregnancy, mental health, dental care, food assistance, housing, and substance abuse counseling.  Also, available online at:  http://[email or web address].org    MEDICAL RECORDS AND TESTS  Certain laboratory test results do not come back the same day,  for example urine cultures.   We will contact you if other important findings are noted.  Radiology films are often reviewed again to ensure accuracy.  If there is any discrepancy, we will notify you.      Please call [phone] to pick up a complimentary CD of any radiology studies performed.  If you or your doctor would like to request a copy of your medical records, please call [phone].      ORTHOPEDIC INJURY   Please know that significant injuries can exist even when an initial x-ray is read as normal or negative.  This can occur because some fractures (broken bones) are not initially visible on x-rays.  For this reason, close outpatient follow-up with your primary care doctor or bone specialist (orthopedist) is required.    MEDICATIONS AND FOLLOWUP  Please be aware that some prescription medications can cause drowsiness.  Use caution when driving or operating machinery.    The examination and treatment you have received in our Emergency Department is provided on an emergency basis, and is not intended to be a substitute for your primary care physician.  It is important that your doctor checks you again and that you report any new or remaining problems at that time.      24 HOUR PHARMACIES  [facility] - [address] [place], [place] [address] (1.4 miles, 7 minutes)  [facility] - [address], [place], [place] [address] (6.5 miles, 13 minutes)  Handout with directions  available on request

## 2014-11-16 NOTE — ED Provider Notes (Signed)
Physician/Midlevel provider first contact with patient: 11/16/14 1423         [facility] EMERGENCY DEPARTMENT HISTORY AND PHYSICAL EXAM    Patient Name: Rachel Diaz, Rachel Diaz  Encounter Date:  11/16/2014  Rendering Provider: Clovis Riley, MD  Patient DOB:  10-14-1950  MRN:  [id number]    History of Presenting Illness     Historian: Patient    64 y.o. female nonsmoker with h/o hyperlipidemia p/w persistent right ankle swelling since yesterday.  Swelling has decreased somewhat since last night.  Pt began noticing some pain 2 days ago, but notes she was having pain elsewhere at the time.  She has had discomfort in her right leg for a month, especially at night.  No injury.  Denies left ankle swelling or pain.      Nursing (triage) note reviewed for the following pertinent information:    PMD:  Sheran Luz, MD    Past Medical History     Past Medical History   Diagnosis Date   . Hyperlipidemia        Past Surgical History     Past Surgical History   Procedure Laterality Date   . Tonsillectomy     . Breast biopsy     . Total knee arthroplasty  B/L 2009, 2010       Family History     Family History   Problem Relation Age of Onset   . Heart failure Mother    . Heart failure Father    . CABG Father        Social History     History     Social History   . Marital Status: Married     Spouse Name: N/A   . Number of Children: N/A   . Years of Education: N/A     Social History Main Topics   . Smoking status: Never Smoker    . Smokeless tobacco: Not on file   . Alcohol Use: Yes      Comment: rare   . Drug Use: No   . Sexual Activity: Not on file     Other Topics Concern   . Not on file     Social History Narrative       Home Medications     Home medications reviewed by ED MD     Discharge Medication List as of 11/16/2014  5:32 PM      CONTINUE these medications which have NOT CHANGED    Details   amphetamine-dextroamphetamine (ADDERALL) 10 MG tablet Take 10 mg by mouth 2 (two) times daily., Until Discontinued, Historical Med       cetirizine (ZYRTEC ALLERGY) 10 MG tablet Take 10 mg by mouth daily., Until Discontinued, Historical Med      gabapentin (NEURONTIN) 300 MG capsule Take 300 mg by mouth daily., Until Discontinued, Historical Med      omeprazole (PRILOSEC) 20 MG capsule Take 20 mg by mouth as needed.   , Until Discontinued, Historical Med      ranitidine (ZANTAC) 75 MG tablet Take 75 mg by mouth as needed.   , Until Discontinued, Historical Med             Review of Systems     Constitutional:  No fever  Eyes: No discharge   ENT: No ST  CV:  No CP   Resp:  No SOB or cough  GI: No abd pain, N, V, D  GU: No dysuria  MS:  +Right  ankle swelling, +Right leg discomfort, No left ankle swelling or pain  Skin: No rash  Neuro:  No HA  Psych:  No behavior changes  All other systems reviewed and negative    Physical Exam     BP 167/76 mmHg  Pulse 85  Temp(Src) 98.3 F (36.8 C)  Resp 16  Ht 5\' 2"  (1.575 m)  Wt 83.462 kg  BMI 33.65 kg/m2  SpO2 99%    CONSTITUTIONAL   Vital signs reviewed.  HEAD  Atraumatic, Normocephalic.  EYES   Eyes are normal to inspection, No discharge from eyes.  ENT    No drooling.  No pharyngeal edema  NECK   No jugular venous distention.  RESPIRATORY CHEST  No respiratory distress.  ABDOMEN  No distension.  UPPER EXTREMITY   Inspection normal, No cyanosis.  LOWER EXTREMITY  1-2+ bilateral lower extremity edema, No calf tenderness, No redness, No warmth.  NEURO   GCS is 15, Speech normal.  SKIN   Skin is warm, Skin is dry, Skin is normal color.  PSYCHIATRIC   Oriented X 3, Normal affect, Normal concentration.    ED Medications Administered     ED Medication Orders     None          Orders Placed During This Encounter     Orders Placed This Encounter   Procedures   . [place] VenoDopp Low Extremity Right   . Basic Metabolic Panel   . GFR       Diagnostic Study Results     The results of the diagnostic studies below were reviewed by the ED provider:    Labs  Results     Procedure Component Value Units Date/Time    Basic  Metabolic Panel [[id number]]  (Abnormal) Collected:  11/16/14 1501    Specimen Information:  Blood Updated:  11/16/14 1528     Glucose 96 mg/dL      BUN 15 mg/dL      Creatinine 1.0 mg/dL      CALCIUM 40.9 mg/dL      Sodium 811 mEq/L      Potassium 4.0 mEq/L      Chloride 104 mEq/L      CO2 30 (H) mEq/L      Anion Gap 8.0     GFR [[id number]] Collected:  11/16/14 1501     EGFR 55.4 Updated:  11/16/14 1528          Radiologic Studies  Radiology Results (24 Hour)     Procedure Component Value Units Date/Time    [place] VenoDopp Low Extremity Right [[id number]] Collected:  11/16/14 1659    Order Status:  Completed Updated:  11/16/14 1705    Narrative:      History: Right leg pain and swelling    Duplex evaluation of the deep venous system of the right lower extremity  was performed for evaluation of DVT.   The common femoral vein, femoral  vein and popliteal vein were visualized and appear unremarkable.  These  veins are compressible with no evidence of thrombus.  Doppler  demonstrates patency and normal directional and phasic flow.   Appropriate augmentation was observed.  The proximal deep femoral vein  is visualized and appears unremarkable.  The portions of the posterior  tibial and peroneal veins that were seen appear unremarkable with no  thrombus noted.  There is no indirect evidence of calf vein thrombus.      Impression:       No evidence of deep  venous thrombosis involving the right  lower extremity.            Geanie Cooley, MD   11/16/2014 5:00 PM            Scribe and MD Attestations     I, Clovis Riley, MD, personally performed the services documented. Carlyn Jimmey Ralph Poindexter is scribing for me on Hemmer,Genene E. I reviewed and confirm the accuracy of the information in this medical record.    I, Carlyn [employer], am serving as a Neurosurgeon to document services personally performed by Clovis Riley, MD, based on the provider's statements to me.     Rendering Provider: Clovis Riley, MD    Monitors, EKG,  Critical Care, and Splints     EKG (interpreted by ED physician): n/a  Cardiac Monitor (interpreted by ED physician): n/a    Critical Care:   Splint check:      MDM and Clinical Notes     Notes:    Reevaluation at 5:25 PM - will treat with low dose Lasix.  Patient to follow up with PMD.  Stable for discharge.  Return if worse.    Consults:    Diagnosis and Disposition     Clinical Impression  1. Bilateral leg edema        Disposition  ED Disposition     Discharge JASKIRAT ZERTUCHE discharge to home/self care.    Condition at disposition: Stable            Prescriptions       Discharge Medication List as of 11/16/2014  5:32 PM      START taking these medications    Details   furosemide (LASIX) 20 MG tablet Take 1 tablet (20 mg total) by mouth daily., Starting 11/16/2014, Until Discontinued, Normal                   Clovis Riley, MD  11/16/14 1901

## 2014-11-16 NOTE — ED Notes (Signed)
Yesterday with right ankle and foot swelling with right leg aching for past month.  Pt states swelling today isn't bad at all.  Contacted PMD and notified to be seen to R/O blood clot.

## 2014-12-08 ENCOUNTER — Other Ambulatory Visit: Payer: Self-pay | Admitting: Orthopaedic Surgery

## 2014-12-08 DIAGNOSIS — M25571 Pain in right ankle and joints of right foot: Secondary | ICD-10-CM

## 2014-12-11 ENCOUNTER — Ambulatory Visit: Payer: Medicare Other

## 2014-12-12 ENCOUNTER — Other Ambulatory Visit: Payer: Medicare Other

## 2014-12-12 ENCOUNTER — Ambulatory Visit: Payer: Medicare Other | Attending: Orthopaedic Surgery

## 2014-12-12 DIAGNOSIS — X58XXXA Exposure to other specified factors, initial encounter: Secondary | ICD-10-CM | POA: Insufficient documentation

## 2014-12-12 DIAGNOSIS — M62571 Muscle wasting and atrophy, not elsewhere classified, right ankle and foot: Secondary | ICD-10-CM | POA: Insufficient documentation

## 2014-12-12 DIAGNOSIS — M948X6 Other specified disorders of cartilage, lower leg: Secondary | ICD-10-CM | POA: Insufficient documentation

## 2014-12-12 DIAGNOSIS — M25571 Pain in right ankle and joints of right foot: Secondary | ICD-10-CM | POA: Insufficient documentation

## 2014-12-12 DIAGNOSIS — S93491A Sprain of other ligament of right ankle, initial encounter: Secondary | ICD-10-CM | POA: Insufficient documentation

## 2014-12-12 MED ORDER — GADOBUTROL 1 MMOL/ML IV SOLN
8.0000 mL | Freq: Once | INTRAVENOUS | Status: AC | PRN
Start: 2014-12-12 — End: 2014-12-12
  Administered 2014-12-12: 8 mmol via INTRAVENOUS

## 2015-11-13 ENCOUNTER — Encounter (INDEPENDENT_AMBULATORY_CARE_PROVIDER_SITE_OTHER): Payer: Medicare Other

## 2016-01-13 ENCOUNTER — Other Ambulatory Visit: Payer: Self-pay | Admitting: Family Medicine

## 2016-01-14 ENCOUNTER — Other Ambulatory Visit: Payer: Self-pay | Admitting: Family Medicine

## 2016-01-14 DIAGNOSIS — R519 Headache, unspecified: Secondary | ICD-10-CM

## 2016-01-15 ENCOUNTER — Ambulatory Visit: Payer: Medicare Other | Attending: Family Medicine

## 2016-01-15 DIAGNOSIS — G459 Transient cerebral ischemic attack, unspecified: Secondary | ICD-10-CM | POA: Insufficient documentation

## 2016-01-15 DIAGNOSIS — R519 Headache, unspecified: Secondary | ICD-10-CM

## 2016-06-01 ENCOUNTER — Other Ambulatory Visit: Payer: Self-pay | Admitting: Family Medicine

## 2017-04-07 ENCOUNTER — Ambulatory Visit: Payer: Self-pay

## 2017-04-07 ENCOUNTER — Encounter (FREE_STANDING_LABORATORY_FACILITY): Payer: Medicare Other

## 2017-04-07 DIAGNOSIS — K317 Polyp of stomach and duodenum: Secondary | ICD-10-CM

## 2017-04-07 DIAGNOSIS — K209 Esophagitis, unspecified: Secondary | ICD-10-CM

## 2017-04-07 DIAGNOSIS — K2950 Unspecified chronic gastritis without bleeding: Secondary | ICD-10-CM

## 2017-04-11 LAB — LAB USE ONLY - HISTORICAL SURGICAL PATHOLOGY

## 2017-09-15 ENCOUNTER — Other Ambulatory Visit: Payer: Self-pay | Admitting: Family Medicine

## 2019-07-09 ENCOUNTER — Other Ambulatory Visit: Payer: Self-pay | Admitting: Family Medicine

## 2019-07-10 DIAGNOSIS — M2020 Hallux rigidus, unspecified foot: Secondary | ICD-10-CM | POA: Insufficient documentation

## 2019-07-10 DIAGNOSIS — S92353A Displaced fracture of fifth metatarsal bone, unspecified foot, initial encounter for closed fracture: Secondary | ICD-10-CM | POA: Insufficient documentation

## 2019-07-10 DIAGNOSIS — M25562 Pain in left knee: Secondary | ICD-10-CM | POA: Insufficient documentation

## 2019-08-29 ENCOUNTER — Emergency Department: Payer: Medicare Other

## 2019-08-29 ENCOUNTER — Inpatient Hospital Stay
Admission: EM | Admit: 2019-08-29 | Discharge: 2019-09-01 | DRG: 066 | Disposition: A | Discharge status: Home / Self Care | Payer: Medicare Other | Attending: Internal Medicine | Admitting: Internal Medicine

## 2019-08-29 DIAGNOSIS — R202 Paresthesia of skin: Secondary | ICD-10-CM

## 2019-08-29 DIAGNOSIS — I6329 Cerebral infarction due to unspecified occlusion or stenosis of other precerebral arteries: Principal | ICD-10-CM | POA: Diagnosis present

## 2019-08-29 DIAGNOSIS — E785 Hyperlipidemia, unspecified: Secondary | ICD-10-CM | POA: Diagnosis present

## 2019-08-29 DIAGNOSIS — Z96653 Presence of artificial knee joint, bilateral: Secondary | ICD-10-CM | POA: Diagnosis present

## 2019-08-29 DIAGNOSIS — I129 Hypertensive chronic kidney disease with stage 1 through stage 4 chronic kidney disease, or unspecified chronic kidney disease: Secondary | ICD-10-CM | POA: Diagnosis present

## 2019-08-29 DIAGNOSIS — R079 Chest pain, unspecified: Secondary | ICD-10-CM

## 2019-08-29 DIAGNOSIS — J45909 Unspecified asthma, uncomplicated: Secondary | ICD-10-CM | POA: Diagnosis present

## 2019-08-29 DIAGNOSIS — Z8249 Family history of ischemic heart disease and other diseases of the circulatory system: Secondary | ICD-10-CM

## 2019-08-29 DIAGNOSIS — I679 Cerebrovascular disease, unspecified: Secondary | ICD-10-CM

## 2019-08-29 DIAGNOSIS — F419 Anxiety disorder, unspecified: Secondary | ICD-10-CM | POA: Diagnosis present

## 2019-08-29 DIAGNOSIS — G459 Transient cerebral ischemic attack, unspecified: Secondary | ICD-10-CM

## 2019-08-29 DIAGNOSIS — I639 Cerebral infarction, unspecified: Secondary | ICD-10-CM

## 2019-08-29 DIAGNOSIS — R29701 NIHSS score 1: Secondary | ICD-10-CM | POA: Diagnosis present

## 2019-08-29 DIAGNOSIS — F909 Attention-deficit hyperactivity disorder, unspecified type: Secondary | ICD-10-CM | POA: Diagnosis present

## 2019-08-29 DIAGNOSIS — Z79899 Other long term (current) drug therapy: Secondary | ICD-10-CM

## 2019-08-29 DIAGNOSIS — N189 Chronic kidney disease, unspecified: Secondary | ICD-10-CM | POA: Diagnosis present

## 2019-08-29 HISTORY — DX: Unspecified asthma, uncomplicated: J45.909

## 2019-08-29 LAB — CBC AND DIFFERENTIAL
Absolute NRBC: 0 10*3/uL (ref 0.00–0.00)
Basophils Absolute Automated: 0.04 10*3/uL (ref 0.00–0.08)
Basophils Automated: 0.5 %
Eosinophils Absolute Automated: 0.3 10*3/uL (ref 0.00–0.44)
Eosinophils Automated: 4 %
Hematocrit: 39.9 % (ref 34.7–43.7)
Hgb: 13.4 g/dL (ref 11.4–14.8)
Immature Granulocytes Absolute: 0.01 10*3/uL (ref 0.00–0.07)
Immature Granulocytes: 0.1 %
Lymphocytes Absolute Automated: 1.02 10*3/uL (ref 0.42–3.22)
Lymphocytes Automated: 13.7 %
MCH: 32.3 pg (ref 25.1–33.5)
MCHC: 33.6 g/dL (ref 31.5–35.8)
MCV: 96.1 fL — ABNORMAL HIGH (ref 78.0–96.0)
MPV: 9.1 fL (ref 8.9–12.5)
Monocytes Absolute Automated: 0.7 10*3/uL (ref 0.21–0.85)
Monocytes: 9.4 %
Neutrophils Absolute: 5.4 10*3/uL (ref 1.10–6.33)
Neutrophils: 72.3 %
Nucleated RBC: 0 /100 WBC (ref 0.0–0.0)
Platelets: 307 10*3/uL (ref 142–346)
RBC: 4.15 10*6/uL (ref 3.90–5.10)
RDW: 13 % (ref 11–15)
WBC: 7.47 10*3/uL (ref 3.10–9.50)

## 2019-08-29 LAB — PT AND APTT
PT INR: 0.9 (ref 0.9–1.1)
PT: 12.4 s — ABNORMAL LOW (ref 12.6–15.0)
PTT: 27 s (ref 23–37)

## 2019-08-29 LAB — BASIC METABOLIC PANEL
Anion Gap: 12 (ref 5.0–15.0)
BUN: 15.8 mg/dL (ref 7.0–19.0)
CO2: 26 mEq/L (ref 22–29)
Calcium: 9.5 mg/dL (ref 7.9–10.2)
Chloride: 104 mEq/L (ref 100–111)
Creatinine: 1.2 mg/dL — ABNORMAL HIGH (ref 0.6–1.0)
Glucose: 117 mg/dL — ABNORMAL HIGH (ref 70–100)
Potassium: 4.4 mEq/L (ref 3.5–5.1)
Sodium: 142 mEq/L (ref 136–145)

## 2019-08-29 LAB — ECG 12-LEAD
Atrial Rate: 85 {beats}/min
P Axis: 39 degrees
P-R Interval: 136 ms
Q-T Interval: 360 ms
QRS Duration: 68 ms
QTC Calculation (Bezet): 428 ms
R Axis: 15 degrees
T Axis: 56 degrees
Ventricular Rate: 85 {beats}/min

## 2019-08-29 LAB — GFR: EGFR: 44.3

## 2019-08-29 LAB — I-STAT TROPONIN: i-STAT Troponin: 0.01 ng/mL (ref 0.00–0.09)

## 2019-08-29 MED ORDER — LABETALOL HCL 5 MG/ML IV SOLN (WRAP)
10.00 mg | INTRAVENOUS | Status: DC | PRN
Start: 2019-08-29 — End: 2019-09-01

## 2019-08-29 MED ORDER — ENOXAPARIN SODIUM 40 MG/0.4ML SC SOLN
40.00 mg | Freq: Every day | SUBCUTANEOUS | Status: DC
Start: 2019-08-29 — End: 2019-09-01
  Administered 2019-08-29 – 2019-09-01 (×4): 40 mg via SUBCUTANEOUS
  Filled 2019-08-29 (×4): qty 0.4

## 2019-08-29 MED ORDER — ASPIRIN 81 MG PO CHEW
81.00 mg | CHEWABLE_TABLET | Freq: Every day | ORAL | Status: DC
Start: 2019-08-30 — End: 2019-08-31
  Administered 2019-08-30: 08:00:00 81 mg via ORAL
  Filled 2019-08-29: qty 1

## 2019-08-29 MED ORDER — ASPIRIN 325 MG PO TABS
325.0000 mg | ORAL_TABLET | Freq: Once | ORAL | Status: AC
Start: 2019-08-29 — End: 2019-08-29
  Administered 2019-08-29: 13:00:00 325 mg via ORAL
  Filled 2019-08-29: qty 1

## 2019-08-29 MED ORDER — ATORVASTATIN CALCIUM 10 MG PO TABS
10.0000 mg | ORAL_TABLET | Freq: Every evening | ORAL | Status: DC
Start: 2019-08-29 — End: 2019-08-30
  Administered 2019-08-29: 23:00:00 10 mg via ORAL
  Filled 2019-08-29: qty 1

## 2019-08-29 MED ORDER — ACETAMINOPHEN 650 MG RE SUPP
650.00 mg | RECTAL | Status: DC | PRN
Start: 2019-08-29 — End: 2019-09-01

## 2019-08-29 MED ORDER — ACETAMINOPHEN 325 MG PO TABS
650.0000 mg | ORAL_TABLET | ORAL | Status: DC | PRN
Start: 2019-08-29 — End: 2019-09-01

## 2019-08-29 MED ORDER — ASPIRIN 81 MG PO CHEW
81.00 mg | CHEWABLE_TABLET | Freq: Every day | ORAL | Status: DC
Start: 2019-08-29 — End: 2019-08-29

## 2019-08-29 NOTE — Plan of Care (Addendum)
NURSING ADMISSION NOTE    Date Time: 08/29/19 5:57 PM  Patient Name: Rachel Diaz  Attending Physician: Viviann Spare, MD    Date of Admission:   08/29/2019    Reason for Admission:   TIA (transient ischemic attack) [G45.9]    Admitted from:   ED    Nursing Note:   Patient AOx4, English speaking, and VSS. On tele, NSR, confirmed with [facility], and alerted Charge Nurse Vernona Rieger. Oriented patient to the floor and POC. Bed to low position. Call bell within reach.     Patient scores as low/mod/high: Mod Falls risk. Patient ambulates: independently with: standby assist.  Bed alarm on and floor mats in place. Patient belongings reviewed, home medications not present. Patient skin intact - rashes on the abdomen, 4 eyes in 4 hours with RN . Patient reports pain as 0. Nursing dysphagia screen passed. NIHSS 0.   Active Lines:    Patient Lines/Drains/Airways Status    Active Lines, Drains and Airways     Name:   Placement date:   Placement time:   Site:   Days:    Peripheral IV 08/29/19 18 G Right Antecubital   08/29/19    1216    Antecubital   less than 1                  Vitals:    08/29/19 1300 08/29/19 1500 08/29/19 1600 08/29/19 1720   BP:  182/82 184/77    Pulse: 77 89 90 90   Resp: 18 20 18 17    Temp:    97.7 F (36.5 C)   TempSrc:       SpO2: 97% 99% 97% 98%   Weight:    90.7 kg (200 lb)   Height:    1.575 m (5\' 2" )         Problem: Safety  Goal: Patient will be free from injury during hospitalization  Outcome: Progressing  Flowsheets (Taken 08/29/2019 1728)  Patient will be free from injury during hospitalization:   Assess patient's risk for falls and implement fall prevention plan of care per policy   Provide and maintain safe environment  Goal: Patient will be free from infection during hospitalization  Outcome: Progressing  Flowsheets (Taken 08/29/2019 1728)  Free from Infection during hospitalization:   Monitor lab/diagnostic results   Assess and monitor for signs and symptoms of infection   Monitor all insertion  sites (i.e. indwelling lines, tubes, urinary catheters, and drains)   Encourage patient and family to use good hand hygiene technique     Problem: Pain  Goal: Pain at adequate level as identified by patient  Outcome: Progressing  Flowsheets (Taken 08/29/2019 1728)  Pain at adequate level as identified by patient:   Identify patient comfort function goal   Assess for risk of opioid induced respiratory depression, including snoring/sleep apnea. Alert healthcare team of risk factors identified.   Assess pain on admission, during daily assessment and/or before any "as needed" intervention(s)   Evaluate if patient comfort function goal is met   Reassess pain within 30-60 minutes of any procedure/intervention, per Pain Assessment, Intervention, Reassessment (AIR) Cycle   Offer non-pharmacological pain management interventions     Problem: Side Effects from Pain Analgesia  Goal: Patient will experience minimal side effects of analgesic therapy  Outcome: Progressing  Flowsheets (Taken 08/29/2019 1728)  Patient will experience minimal side effects of analgesic therapy:   Monitor/assess patient's respiratory status (RR depth, effort, breath sounds)   Assess for changes  in cognitive function   Prevent/manage side effects per LIP orders (i.e. nausea, vomiting, pruritus, constipation, urinary retention, etc.)   Evaluate for opioid-induced sedation with appropriate assessment tool (i.e. POSS)     Problem: Discharge Barriers  Goal: Patient will be discharged home or other facility with appropriate resources  Outcome: Progressing  Flowsheets (Taken 08/29/2019 1728)  Discharge to home or other facility with appropriate resources:   Provide information on available health resources   Provide appropriate patient education   Initiate discharge planning     Problem: Psychosocial and Spiritual Needs  Goal: Demonstrates ability to cope with hospitalization/illness  Outcome: Progressing  Flowsheets (Taken 08/29/2019  1728)  Demonstrates ability to cope with hospitalizations/illness:   Encourage verbalization of feelings/concerns/expectations   Provide quiet environment     Problem: Day of Admission - Stroke  Goal: Core/Quality measure requirements - Admission  Outcome: Progressing  Flowsheets (Taken 08/29/2019 1728)  Core/Quality measure requirements - Admission:   Document NIH Stroke Scale on admission   Document nursing swallow/dysphagia screen on admission. If patient fails, keep patient NPO (follow your hospital protocol on swallowing screening).   Ensure antithrombotic administered or contraindication documented by LIP   If diagnosis or history of Atrial Fib/Atrial Flutter, ensure oral anticoagulation is initiated or contraindication documented by LIP   VTE Prevention: Ensure anticoagulant(s) administered and/or anti-embolism stockings/devices documented as ordered   Ensure PT/OT and/or SLP ordered   Begin stroke education on admission (must include Modifiable Risk Factors, Warning Signs and Symptoms of Stroke, Activation of Emergency Medical System and Follow-up Appointments) Ensure handout has been given and documented.     Problem: Every Day - Stroke  Goal: Core/Quality measure requirements - Daily  Outcome: Progressing  Flowsheets (Taken 08/29/2019 1728)  Core/Quality measure requirements - Daily:   VTE Prevention: Ensure anticoagulant(s) administered and/or anti-embolism stockings/devices documented by end of day 2   Ensure antithrombotic administered or contraindication documented by LIP by end of day 2   Once lipid panel has resulted, check LDL. Contact provider for statin order if LDL > 70 (or ensure contraindication documented by LIP).   Continue stroke education (must include Modifiable Risk Factors, Warning Signs and Symptoms of Stroke, Activation of Emergency Medical System and Follow-up Appointments). Ensure handout has been given and documented.  Goal: Neurological status is stable or  improving  Outcome: Progressing  Flowsheets (Taken 08/29/2019 1728)  Neurological status is stable or improving:   Monitor/assess/document neurological assessment (Stroke: every 4 hours)   Monitor/assess NIH Stroke Scale   Perform CAM Assessment   Observe for seizure activity and initiate seizure precautions if indicated   Re-assess NIH Stroke Scale for any change in status  Goal: Patient's risk of aspiration will be minimized  Outcome: Progressing  Flowsheets (Taken 08/29/2019 1728)  Patient's risk of aspiration will be minimized:   Monitor/assess for signs of aspiration (tachypnea, cough, wheezing, clearing throat, hoarseness after eating, decrease in SaO2   Complete new dysphagia screen for any change in status: Keep patient NPO if patient fails   Place swallow precaution signage above bed   Order modified texture diet as recommend by Speech Pathologist   Thicken liquids as recommended by Speech Pathologist   Assess and monitor ability to swallow   Instruct patient to take small bites   HOB up 90 degrees to eat if unable to be OOB   Place patient up in chair to eat, if possible   Keep head of bed up a minimum of 30 degrees when hemodynamically  stable  Goal: Neurovascular status is stable or improving  Outcome: Progressing  Flowsheets (Taken 08/29/2019 1728)  Neurovascular status is stable or improving:   Monitor/assess neurovascular status (pulses, capillary refill, pain, paresthesia, presence of edema)   Monitor/assess for signs of Venous Thrombus Emboli (edema of calf/thigh redness, pain)   Monitor/assess site of invasive procedure for signs of bleeding     Problem: Day of Discharge - Stroke  Goal: Able to express understanding of discharge instructions and stroke education  Outcome: Progressing  Flowsheets (Taken 08/29/2019 1728)  Able to express understanding of discharge instructions and stroke education:   Provide alternative method of communication if needed   Consult/collaborate with Case  Management/Social Work   Include patient care companion in decisions related to communication   Ensure "Discharge instructions: Stroke" appears on discharge paperwork  (After Visit Summary-AVS)   Patient/patient care companion demonstrates understanding on disease process, treatment plan, medications and discharge plan

## 2019-08-29 NOTE — H&P (Addendum)
[facility] HOSPITALIST ADMISSION HISTORY AND PHYSICAL EXAM    Date Time: 08/29/19 5:20 PM  Patient Name: Rachel Diaz  Attending Physician: Viviann Spare, MD  Primary Care Physician: Sheran Luz, MD    CC: Right-sided paresthesia      Assessment:   69 year old female with history of ADHD on daily Adderall, allergies, asthma related to allergies presents with acute onset of right-sided paresthesia.  CT head negative    Depression, recently started on antidepressants    Plan:   Admit for observation  Neuro checks  Swallow screen  Low-dose aspirin and statin  Check MRI/MRA as per stroke protocol  Risk stratification-check lipid profile, hemoglobin A1c, TSH, urine analysis    Hold Adderall for now  Lovenox for DVT prophylaxis  Full code    Consulted neurology, Dr. Kristopher Oppenheim will see in AM     Disposition: (Please see PAF column for Expected D/C Date)   Today's date: 08/29/2019   Admit Date: 08/29/2019 12:12 PM  Service status: Observation      History of Presenting Illness:   Rachel Diaz is a 69 y.o. female who presents to the hospital with acute right-sided numbness starting when she woke up this morning.    Patient with history of ADHD and daily Adderall, recent depression from death of multiple family members presented to [facility] with acute onset of right-sided numbness when she woke up this morning.  She felt her right forearm and face were feeling numb as well as proximal right thigh.  She was able to move and ambulate normally, denies any speech difficulties.  Denies any neck pain or stiffness or jaw pain.  Denies any chest pain, headache or shortness of breath.  Since her symptoms persisted she went to the emergency room where she was transferred over here for further management.  Head CT in the ER was negative.    Symptoms have resolved now    Patient reports recently she was started on an antidepressant about 2 3 months ago when she had several deaths in the family    Past Medical History:     Past Medical  History:   Diagnosis Date    Asthma     Hyperlipidemia        Past Surgical History:     Past Surgical History:   Procedure Laterality Date    BREAST BIOPSY      TONSILLECTOMY      TOTAL KNEE ARTHROPLASTY  B/L 2009, 2010       Family History:   Father-CAD, CHF  Mother-CHF    Social History:     Social History     Tobacco Use   Smoking Status Never Smoker   Smokeless Tobacco Never Used     Social History     Substance and Sexual Activity   Alcohol Use Yes    Comment: rare     Social History     Substance and Sexual Activity   Drug Use No       Allergies:     Allergies   Allergen Reactions    Morphine      Mental changes       Orphenadrine Citrate      Presyncope     Percocet [Oxycodone-Acetaminophen]        Medications:     Home Medications     Med List Status: In Progress Set By: Phylis Bougie, RN at 08/29/2019 12:25 PM        Status Comment  08/29/2019 12:36 PM    Pt states she is unsure,  " I take a lot of medicines, mostly vitamins, I cant remember right now"                  amphetamine-dextroamphetamine (ADDERALL) 10 MG tablet     Take 10 mg by mouth 2 (two) times daily.     cetirizine (ZYRTEC ALLERGY) 10 MG tablet     Take 10 mg by mouth daily.     gabapentin (NEURONTIN) 300 MG capsule     Take 300 mg by mouth daily.     omeprazole (PRILOSEC) 20 MG capsule     Take 20 mg by mouth as needed.        ranitidine (ZANTAC) 75 MG tablet     Take 75 mg by mouth as needed.              Flagged for Removal             furosemide (LASIX) 20 MG tablet     Take 1 tablet (20 mg total) by mouth daily.              Review of Systems:   All other systems were reviewed and are negative except: As mentioned in HPI    Physical Exam:     Patient Vitals for the past 24 hrs:   BP Temp Temp src Pulse Resp SpO2 Height Weight   08/29/19 1600 184/77   90 18 97 %     08/29/19 1500 182/82   89 20 99 %     08/29/19 1300    77 18 97 %     08/29/19 1241 183/74   84 16 100 %     08/29/19 1214 185/88 98.4 F  (36.9 C) Oral 88 19 99 % 1.575 m (5\' 2" ) 90.7 kg (200 lb)     Body mass index is 36.58 kg/m.  No intake or output data in the 24 hours ending 08/29/19 1720    General: awake, alert, oriented x 3; no acute distress.  HEENT: perrla, eomi, sclera anicteric  oropharynx clear without lesions, mucous membranes moist  Neck: supple, no lymphadenopathy, no thyromegaly, no JVD, no carotid bruits  Cardiovascular: regular rate and rhythm, no murmurs, rubs or gallops  Lungs: clear to auscultation bilaterally, without wheezing, rhonchi, or rales  Abdomen: soft, non-tender, non-distended; no palpable masses, no hepatosplenomegaly, normoactive bowel sounds, no rebound or guarding  Extremities: no clubbing, cyanosis, or edema  Neuro: cranial nerves grossly intact, strength 5/5 in upper and lower extremities, sensation intact,   Skin: no rashes or lesions noted        Labs:     Results     Procedure Component Value Units Date/Time    GFR [[id number]] Collected: 08/29/19 1221     Updated: 08/29/19 1243     EGFR 44.3    Basic Metabolic Panel [[id number]]  (Abnormal) Collected: 08/29/19 1221    Specimen: Blood Updated: 08/29/19 1243     Glucose 117 mg/dL      BUN 78.2 mg/dL      Creatinine 1.2 mg/dL      Calcium 9.5 mg/dL      Sodium 956 mEq/L      Potassium 4.4 mEq/L      Chloride 104 mEq/L      CO2 26 mEq/L      Anion Gap 12.0    PT/APTT [[id number]]  (Abnormal) Collected: 08/29/19 1221  Updated: 08/29/19 1238     PT 12.4 sec      PT INR 0.9     PTT 27 sec     i-Stat Troponin [[id number]] Collected: 08/29/19 1221     Updated: 08/29/19 1234     i-STAT Troponin 0.01 ng/mL     CBC and differential [[id number]]  (Abnormal) Collected: 08/29/19 1221    Specimen: Blood Updated: 08/29/19 1224     WBC 7.47 x10 3/uL      Hgb 13.4 g/dL      Hematocrit 78.2 %      Platelets 307 x10 3/uL      RBC 4.15 x10 6/uL      MCV 96.1 fL      MCH 32.3 pg      MCHC 33.6 g/dL      RDW 13 %      MPV 9.1 fL      Neutrophils 72.3 %      Lymphocytes Automated 13.7  %      Monocytes 9.4 %      Eosinophils Automated 4.0 %      Basophils Automated 0.5 %      Immature Granulocytes 0.1 %      Nucleated RBC 0.0 /100 WBC      Neutrophils Absolute 5.40 x10 3/uL      Lymphocytes Absolute Automated 1.02 x10 3/uL      Monocytes Absolute Automated 0.70 x10 3/uL      Eosinophils Absolute Automated 0.30 x10 3/uL      Basophils Absolute Automated 0.04 x10 3/uL      Immature Granulocytes Absolute 0.01 x10 3/uL      Absolute NRBC 0.00 x10 3/uL           Imaging personally reviewed, including:    CT Head WO Contrast [[id number]] (Order [id number])  Status: Final result   Study Result    HISTORY: Right-sided numbness.    COMPARISON: MRI of the brain dated 01/15/2016.    TECHNIQUE: Noncontrast CT of the head. The following dose reduction  techniques were utilized: automatic exposure control and/or adjustment  of mA and/or kV according to patient size, and the use of iterative  reconstruction technique.    FINDINGS: There is no evidence of acute territorial infarction or  intracranial hemorrhage. There is mild diffuse cerebral volume loss.  There is no hydrocephalus.. There is no mass effect or midline shift. No  extra-axial collections are seen. The globes and orbits appear  unremarkable. The visualized paranasal sinuses and mastoid air cells are  clear.    IMPRESSION:    No acute intracranial abnormality.         Signed by: Lavonna Rua, MD   VH:QIONGEX, Jaclyn Prime, MD

## 2019-08-29 NOTE — ED Provider Notes (Signed)
EMERGENCY DEPARTMENT HISTORY AND PHYSICAL EXAM    Date Time: 08/29/19 12:15 PM  Patient Name: Rachel Diaz  Attending Physician: Merceda Elks MD.               History of Presenting Illness       Chief Complaint:   Chief Complaint   Patient presents with    Numbness       Rachel Diaz is a 69 y.o. female who presents with R sided numbness onset 10:10 am and clumsiness. She awoke and thought her R arm was just asleep, but when she walked to the bathroom, she noted numbness in the R leg and difficulty walking. "I was all over the place." She notes feeling a little dizzy and mild L posterior HA. She denies vision changes, nausea, CP. She is R hand dominant. She notes gradual resolution of the numbness and clumsiness. She denies any blood thinning medications.     This history was obtained from the(a) patient    Past Medical History     Past Medical History:   Diagnosis Date    Asthma     Hyperlipidemia        Past Surgical History     Past Surgical History:   Procedure Laterality Date    BREAST BIOPSY      TONSILLECTOMY      TOTAL KNEE ARTHROPLASTY  B/L 2009, 2010       Family History     Family History   Problem Relation Age of Onset    Heart failure Mother     Heart failure Father     CABG Father        Social History     Social History     Socioeconomic History    Marital status: Married     Spouse name: Not on file    Number of children: Not on file    Years of education: Not on file    Highest education level: Not on file   Occupational History    Not on file   Social Needs    Financial resource strain: Not on file    Food insecurity     Worry: Not on file     Inability: Not on file    Transportation needs     Medical: Not on file     Non-medical: Not on file   Tobacco Use    Smoking status: Never Smoker    Smokeless tobacco: Never Used   Substance and Sexual Activity    Alcohol use: Yes     Comment: rare    Drug use: No    Sexual activity: Not on file   Lifestyle    Physical activity      Days per week: Not on file     Minutes per session: Not on file    Stress: Not on file   Relationships    Social connections     Talks on phone: Not on file     Gets together: Not on file     Attends religious service: Not on file     Active member of club or organization: Not on file     Attends meetings of clubs or organizations: Not on file     Relationship status: Not on file    Intimate partner violence     Fear of current or ex partner: Not on file     Emotionally abused: Not on file     Physically  abused: Not on file     Forced sexual activity: Not on file   Other Topics Concern    Not on file   Social History Narrative    Not on file       Allergies     Allergies   Allergen Reactions    Morphine      Mental changes       Orphenadrine Citrate      Presyncope     Percocet [Oxycodone-Acetaminophen]        Medications     No current facility-administered medications for this encounter.     Current Outpatient Medications:     amphetamine-dextroamphetamine (ADDERALL) 10 MG tablet, Take 10 mg by mouth 2 (two) times daily., Disp: , Rfl:     cetirizine (ZYRTEC ALLERGY) 10 MG tablet, Take 10 mg by mouth daily., Disp: , Rfl:     furosemide (LASIX) 20 MG tablet, Take 1 tablet (20 mg total) by mouth daily., Disp: 10 tablet, Rfl: 0    gabapentin (NEURONTIN) 300 MG capsule, Take 300 mg by mouth daily., Disp: , Rfl:     omeprazole (PRILOSEC) 20 MG capsule, Take 20 mg by mouth as needed.  , Disp: , Rfl:     ranitidine (ZANTAC) 75 MG tablet, Take 75 mg by mouth as needed.  , Disp: , Rfl:     Review of Systems     Pertinent Positives and Negatives noted in the HPI.  All Other Systems Reviewed and Negative: Yes      Physical Exam     Physical Exam   Constitutional: She is oriented to person, place, and time. She appears well-developed and well-nourished. No distress.   Hypertensive  185/88  Well appearing  NAD   HENT:   Head: Normocephalic and atraumatic.   Mouth/Throat: Oropharynx is clear and moist.   Eyes:  Pupils are equal, round, and reactive to light. Conjunctivae and EOM are normal.   No nystagmus  +glasses   Neck: Normal range of motion. Neck supple.   Cardiovascular: Normal rate, regular rhythm, normal heart sounds and intact distal pulses.   Pulmonary/Chest: Effort normal and breath sounds normal.   Musculoskeletal:         General: No edema.      Comments: R foot in walking boot   Neurological: She is alert and oriented to person, place, and time. No cranial nerve deficit. She exhibits normal muscle tone. Coordination normal.   No droop or drift  5/5 power all extremities  Rapid alternating movements intact  No dysmetria  No sensory deficits   Skin: Skin is warm and dry.   Psychiatric: She has a normal mood and affect.   Nursing note and vitals reviewed.        Diagnostic Study Results     Labs -     Labs Reviewed   CBC AND DIFFERENTIAL - Abnormal; Notable for the following components:       Result Value    MCV 96.1 (*)     All other components within normal limits   PT AND APTT - Abnormal; Notable for the following components:    PT 12.4 (*)     All other components within normal limits   BASIC METABOLIC PANEL - Abnormal; Notable for the following components:    Glucose 117 (*)     Creatinine 1.2 (*)     All other components within normal limits   GFR   I-STAT TROPONIN  Radiologic Studies -   CT Head WO Contrast   Final Result    No acute intracranial abnormality.      Georgann Housekeeper, MD    08/29/2019 12:38 PM            Clinical Course in the Emergency Department/Medical Decision Making     I reviewed the vital signs, nursing notes, past medical history, past surgical history, family history and social history.    Vital Signs - BP: 185/88, Temp: 98.4 F (36.9 C), Temp Source: Oral, Heart Rate: 88, Resp Rate: 19, SpO2: 99 %, Height: 5\' 2"  (157.5 cm), Weight: 90.7 kg    Pulse Oximetry Analysis - nl without need for supplemental oxygen            Labs:I have reviewed the labs at the time of visit.  Merceda Elks MD.      Differential Diagnosis (not completely inclusive): TIA, CVA, UTI, Electrolyte abnormality, cervical radiculopathy, MS    EKG: NSR rate of 85, normal axis, no ST changes, normal conduction    12:43 PM CNS hospitalist paged    12:56 PM Spoke with Dr. Cherlynn June. Admit to neuro tele obs.    Final diagnoses:   TIA (transient ischemic attack)     New Prescriptions    No medications on file     ED Disposition     ED Disposition Condition Date/Time Comment    Observation  Thu Aug 29, 2019 12:56 PM Admitting Physician: Viviann Spare [[id number]]   Service:: Neurology [115]   Estimated Length of Stay: < 2 midnights   Tentative Discharge Plan?: Home or Self Care [1]   Does patient need telemetry?: Yes   Telemetry type (separate Telemetry order is also required):: Medical telemetry            _______________________________    Attestations:    I was acting as a scribe for Jobe Gibbon, MD on Rachel Diaz,Rachel Diaz     I am the first provider for this patient and I personally performed the services documented.  is scribing for me on Rachel Diaz,Rachel Diaz. This note accurately reflects work and decisions made by me.  Jobe Gibbon, MD  _______________________________               Jobe Gibbon, MD  08/29/19 1256

## 2019-08-29 NOTE — ED Notes (Signed)
Pt removed BP cuff on her own, stating it hurt too much, refusing to have it drawn. Educated patient on importance of monitoring her VS including routine BP checks. Attempted to try different cuffs per patient request and different sites with some improvement of the discomfort. Pt is on cardiac monitoring and will continue to stay on. This RN asked patient to not remove any monitoring devices including BP cuff without RN. Call bell given to the patient. Husband at bedside.

## 2019-08-29 NOTE — ED Notes (Signed)
While updating patient about bed status and transport ETA pt stating she does not want to go to [facility]. Informed Dr. Wilma Flavin of patients concerns and Dr. Wilma Flavin discussed reason for transfer to specifically [facility] with patient and husband.

## 2019-08-30 ENCOUNTER — Observation Stay: Payer: Medicare Other

## 2019-08-30 DIAGNOSIS — E785 Hyperlipidemia, unspecified: Secondary | ICD-10-CM

## 2019-08-30 DIAGNOSIS — G459 Transient cerebral ischemic attack, unspecified: Secondary | ICD-10-CM

## 2019-08-30 LAB — BASIC METABOLIC PANEL
Anion Gap: 15 (ref 5.0–15.0)
BUN: 16 mg/dL (ref 7.0–19.0)
CO2: 20 mEq/L — ABNORMAL LOW (ref 22–29)
Calcium: 9.3 mg/dL (ref 7.9–10.2)
Chloride: 101 mEq/L (ref 100–111)
Creatinine: 1.2 mg/dL — ABNORMAL HIGH (ref 0.6–1.0)
Glucose: 110 mg/dL — ABNORMAL HIGH (ref 70–100)
Potassium: 4 mEq/L (ref 3.5–5.1)
Sodium: 136 mEq/L (ref 136–145)

## 2019-08-30 LAB — CBC
Absolute NRBC: 0 10*3/uL (ref 0.00–0.00)
Hematocrit: 38.1 % (ref 34.7–43.7)
Hgb: 12.6 g/dL (ref 11.4–14.8)
MCH: 31.8 pg (ref 25.1–33.5)
MCHC: 33.1 g/dL (ref 31.5–35.8)
MCV: 96.2 fL — ABNORMAL HIGH (ref 78.0–96.0)
MPV: 9.3 fL (ref 8.9–12.5)
Nucleated RBC: 0 /100 WBC (ref 0.0–0.0)
Platelets: 296 10*3/uL (ref 142–346)
RBC: 3.96 10*6/uL (ref 3.90–5.10)
RDW: 13 % (ref 11–15)
WBC: 6.13 10*3/uL (ref 3.10–9.50)

## 2019-08-30 LAB — GFR: EGFR: 44.3

## 2019-08-30 LAB — LIPID PANEL
Cholesterol / HDL Ratio: 4.6
Cholesterol: 262 mg/dL — ABNORMAL HIGH (ref 0–199)
HDL: 57 mg/dL (ref 40–9999)
LDL Calculated: 161 mg/dL — ABNORMAL HIGH (ref 0–99)
Triglycerides: 220 mg/dL — ABNORMAL HIGH (ref 34–149)
VLDL Calculated: 44 mg/dL — ABNORMAL HIGH (ref 10–40)

## 2019-08-30 LAB — PT/INR
PT INR: 1 (ref 0.9–1.1)
PT: 13.1 s (ref 12.6–15.0)

## 2019-08-30 LAB — HEMOGLOBIN A1C
Average Estimated Glucose: 105.4 mg/dL
Hemoglobin A1C: 5.3 % (ref 4.6–5.9)

## 2019-08-30 LAB — HEMOLYSIS INDEX: Hemolysis Index: 12 (ref 0–18)

## 2019-08-30 LAB — APTT: PTT: 30 s (ref 23–37)

## 2019-08-30 MED ORDER — LORAZEPAM 2 MG/ML IJ SOLN
1.00 mg | Freq: Once | INTRAMUSCULAR | Status: AC | PRN
Start: 2019-08-30 — End: 2019-08-30
  Administered 2019-08-30: 21:00:00 1 mg via INTRAVENOUS
  Filled 2019-08-30: qty 1

## 2019-08-30 MED ORDER — AMLODIPINE BESYLATE 5 MG PO TABS
5.0000 mg | ORAL_TABLET | Freq: Every day | ORAL | Status: DC
Start: 2019-08-30 — End: 2019-09-01
  Administered 2019-08-30 – 2019-08-31 (×2): 5 mg via ORAL
  Filled 2019-08-30 (×2): qty 1

## 2019-08-30 MED ORDER — ATORVASTATIN CALCIUM 40 MG PO TABS
40.0000 mg | ORAL_TABLET | Freq: Every evening | ORAL | Status: DC
Start: 2019-08-30 — End: 2019-09-01
  Administered 2019-08-30 – 2019-08-31 (×2): 40 mg via ORAL
  Filled 2019-08-30 (×2): qty 1

## 2019-08-30 NOTE — PT Eval Note (Signed)
[facility]   Physical Therapy Evaluation/Discharge   Patient: Rachel Diaz    MRN#: [id number]   Unit: [facility] TOWER 6  Bed: V409/W119.14      Post Acute Care Therapy Recommendations:   Discharge Recommendations: Home with no needs     Milestones to be reached to achieve recommendation: none  Anticipate achievement in n/a sessions    DME Recommended for Discharge: No additional equipment/DME recommended at this time    Therapy discharge recommendations may change with patient status.  Please refer to most recent note for up-to-date recommendations.    Assessment:   Significant Findings: none    Rachel Diaz is a 69 y.o. female admitted 08/29/2019.  Patient presents with acute onset of right-sided paresthesia.  Pt presents today supine in bed being supervision assist for all bed mobility. Ambulated with pt SBA without AD challenging dynamic balance with head turns, change in gait speed, 360 turns, and stairs all of which pt was able to do without LOB.  Pt walks with a R cam boot 2/2 fx but states her overall balance is baseline.  Pt appears to be at her baseline for mobility/balance and does not need skilled PT at this time. D/C PT.    Impairments: Assessment: Appears to be at baseline for mobility;Appears to be at baseline for balance.     Therapy Diagnosis: dec balance    Rehabilitation Potential: Prognosis: Good    Treatment Activities: evaluation, gait    Educated the patient to role of physical therapy, plan of care, goals of therapy and safety with mobility and ADLs, energy conservation techniques.    Plan:   Treatment/Interventions: No skilled interventions needed at this time     PT Frequency: therapy discontinued   Risks/Benefits/POC Discussed with Pt/Family: With patient        Precautions and Contraindications:   Other Precautions: falls, heel rocker cam boot on R    Consult received for Rachel Diaz for PT Evaluation and Treatment.  Patients medical condition is appropriate  for Physical therapy intervention at this time.    Medical Diagnosis: TIA (transient ischemic attack) [G45.9]      History of Present Illness:   Rachel Diaz is a 69 y.o. female admitted on 08/29/2019 with history of ADHD and daily Adderall, recent depression from death of multiple family members presented to [facility] with acute onset of right-sided numbness when she woke up this morning. She felt her right forearm and face were feeling numb as well as proximal right thigh. She was able to move and ambulate normally, denies any speech difficulties. Denies any neck pain or stiffness or jaw pain. Denies any chest pain, headache or shortness of breath. Since her symptoms persisted she went to the emergency room where she was transferred over here for further management. Head CT in the ER was negative.   Symptoms have resolved now    Per MD note    Past Medical/Surgical History:  Past Medical History:   Diagnosis Date    Asthma     Hyperlipidemia      Past Surgical History:   Procedure Laterality Date    BREAST BIOPSY      TONSILLECTOMY      TOTAL KNEE ARTHROPLASTY  B/L 2009, 2010         X-Rays/Tests/Labs:  Ct Head Wo Contrast    Result Date: 08/29/2019   No acute intracranial abnormality. Georgann Housekeeper, MD  08/29/2019 12:38 PM  Social History:   Prior Level of Function:  Prior level of function: Independent with ADLs, Ambulates independently  Baseline Activity Level: Community ambulation  Driving: independent  Employment: Retired  DME Currently at Home: ADL- Management consultant Living Arrangements:  Living Arrangements: Spouse/significant other  Type of Home: House  Home Layout: Multi-level(2 floors + basement)  Bathroom Shower/Tub: Cabin crew: Paediatric nurse  DME Currently at Home: ADL- Paediatric nurse    Subjective: I feel like I'm back to normal   Patient is agreeable to participation in the therapy session. Nursing clears patient for therapy.     Patient Goal: go home    Pain  Assessment  Pain Assessment: No/denies pain    Objective:   Observation of Patient/Vital Signs:  Patient is in bed with telemetry and peripheral IV in place.  Pt wore mask during therapy session:Yes    Observation of Patient/Vital signs:  Inspection/Posture: walks with cam boot on R     Cognition/Neuro Status  Arousal/Alertness: Appropriate responses to stimuli  Attention Span: Appears intact  Orientation Level: Oriented X4  Memory: Decreased short term memory(able to remember 2/3 words after 5 minutes)  Following Commands: Follows one step commands without difficulty  Safety Awareness: minimal verbal instruction  Insights: Decreased awareness of deficits  Problem Solving: Assistance required to generate solutions  Behavior: calm;cooperative  Motor Planning: intact  Coordination: intact    Musculoskeletal Examination:  Gross ROM  Neck/Trunk ROM: within functional limits  Right Upper Extremity ROM: within functional limits  Left Upper Extremity ROM: within functional limits  Right Lower Extremity ROM: within functional limits  Left Lower Extremity ROM: within functional limits    Gross Strength  Neck/Trunk Strength: WFL  Right Upper Extremity Strength: 5/5  Left Upper Extremity Strength: 5/5  Right Lower Extremity Strength: 5/5  Left Lower Extremity Strength: 5/5         Functional Mobility:  Rolling: Supervision  Supine to Sit: Supervision  Scooting to EOB: Stand by Assist  Sit to Stand: Stand by Assist  Stand to Sit: Stand by Assist         Ambulation:  PMP - Progressive Mobility Protocol   PMP Activity: Step 7 - Walks out of Room  Distance Walked (ft) (Step 6,7): 300 Feet     Ambulation: Supervision  Pattern: decreased cadence;decreased step length(possibly 2/2 cam boot on R)  Stair Management: Stand by Assist;one rail R  Number of Stairs: 10     Balance:  Sitting - Static: Good  Sitting - Dynamic: Good  Standing - Static: Good  Standing - Dynamic: Good        Vision: smooth pursuit intact. Pt denies vision  changes  Coordination: rapid alternation, FTN,  Opposition intact bilaterally  Sensation: denies Numbness/tingling     Participation and Activity Tolerance:  Participation Effort: good  Endurance: Tolerates 10 - 20 min exercise with multiple rests      Patient left with call bell within reach, all needs met,, fall mat in place, chair alarm on and all questions answered. RN notified of session outcome and patient response.       PPE worn during session: procedural mask, face shield and gloves    Tech present: Renato Gails  PPE worn by tech: procedural mask, goggles and gloves    Hardie Shackleton, SPT 08/30/2019 11:02 AM     Supervising therapist present in the room, guiding and directing the student in one on one service to the  patient.  This supervising therapist is making the skilled judgment, and is responsible for the assessment and treatment of the patient.    Renato Gails PT, DPT 08/30/2019 11:02 AM  Pager# [phone]      Time of treatment:   PT Received On: 08/30/19  Start Time: 0850  Stop Time: 0920  Time Calculation (min): 30 min

## 2019-08-30 NOTE — Nursing Progress Note (Signed)
Adult Observation Progress Note      Shift Note: Pt admitted for Right sided numbness of extremeties     General: Pt calm and cooperative  Neuro: Alert and oriented X4, Neuro checks have been WNL this shift  Cardio: NSR on Tele  Resp: Regular rate, unlabored  Integ: warm and dry, intact  MSK: WNL,has foot fracture, wears boot. (pre admission)  GI: No problems  GU: WNL  IV Access: PIV Right AC 18 g, saline locked     BM during shift: no    Pending Orders: MRI per stroke protocol    Discharge Plan:    Social/Family Visits:    POC update:       Vitals:    08/29/19 1839 08/29/19 1951 08/29/19 2308 08/30/19 0224   BP: (!) 160/110 175/80 182/80 154/80   Pulse:  84 87 90   Resp:  19 18 17    Temp:  98.6 F (37 C) 98.4 F (36.9 C) 98.1 F (36.7 C)   TempSrc:  Oral Oral Oral   SpO2:  97% 96% 96%   Weight:       Height:           Patient Lines/Drains/Airways Status    Active Lines, Drains and Airways     Name:   Placement date:   Placement time:   Site:   Days:    Peripheral IV 08/29/19 18 G Right Antecubital   08/29/19    1216    Antecubital   less than 1

## 2019-08-30 NOTE — Progress Notes (Signed)
Adult Observation Progress Note      Shift Note:     Report received from outgoing RN bedside.  Introduced self and updated white board.   Patient AOx4.  VSS.  IV and telemetry unit in place.  NSR.   Patient boot bedside, s/p RLE fracture.       BM during shift: No    Pending Orders: q4 neuro checks, MRI    Discharge Plan: Pending medical discharge    Social/Family Visits: Husband visits at bedside    POC update: Plan of care updated with patient and husband      Vitals:    08/30/19 0805 08/30/19 1224 08/30/19 1609 08/30/19 1655   BP: 144/83   165/90   Pulse:  80 79    Resp: 18 18 18     Temp: 98 F (36.7 C) 98.6 F (37 C) 98.7 F (37.1 C)    TempSrc: Oral Oral Oral    SpO2: 99% 99% 98%    Weight:       Height:           Patient Lines/Drains/Airways Status    Active Lines, Drains and Airways     Name:   Placement date:   Placement time:   Site:   Days:    Peripheral IV 08/29/19 18 G Right Antecubital   08/29/19    1216    Antecubital   1

## 2019-08-30 NOTE — Plan of Care (Signed)
Pt understands importance of taking new meds prescribed to prevent TIA

## 2019-08-30 NOTE — Progress Notes (Signed)
MEDICINE PROGRESS NOTE    Date Time: 08/30/19 2:03 PM  Patient Name: Rachel Diaz  Attending Physician: Viviann Spare, MD    CC: Following up on speech     Assessment:   Hospital Course  Rachel Diaz is a 69 y.o. female with a PMHx of ADD on daily Adderall. Depression and asthma related to allergies, admitted 08/29/2019 to observation for right sided paresthesia.  PTA had some slowness of speech  followed by right sided body paresthesia.  She stated that she has been under a lot of stress due to loss of several family members.  On admission labs notable for WBC 7.47, Hgb 13.4, Hct 39.9, BUN 15.8, Creatinine 1.2, K 4.4, Na 142, Troponin 0.01.  CT head no acute process. Admitted to observation unit for CVA work up and neurology consultation.     Interval History:  3/5:  Ct head no acute process. MRI brain, MRA H&N pending.      Assessment/Plan   # Right sided paresthesia    CT head no acute process    MRI brain, MRA H&N pending    Neurology Dr. Kristopher Oppenheim following, appreciate recommendations    A1C 5.3, Lipid panel ( TC 262, TG 220, HDL 57, LDL 161)   Continue with Lipitor 40 daily, ASA 81 mg daily    PT/OT home with no needs     #ADD on daily Adderall   #Asthma related to allergies   #Depression       DVT ppx: LOVENOX  Expected [place]: Likely [place] in AM    Please see attending note that follows this mid-level encounter note.   Review of Systems:     Review of Systems   Constitutional: Negative.    HENT: Negative.    Eyes: Negative.    Respiratory: Negative.    Cardiovascular: Negative.    Gastrointestinal: Negative.    Genitourinary: Negative.    Musculoskeletal: Negative.    Skin: Negative.    Neurological: Negative.    Endo/Heme/Allergies: Negative.    Psychiatric/Behavioral: Negative.        Physical Exam:   Physical Exam  VITAL SIGNS PHYSICAL EXAM   Temp:  [97.7 F (36.5 C)-98.6 F (37 C)] 98.6 F (37 C)  Heart Rate:  [80-90] 80  Resp Rate:  [17-20] 18  BP: (144-184)/(77-110) 144/83  Blood Glucose:    Telemetry:  NSR     No intake or output data in the 24 hours ending 08/30/19 1403 Physical Exam  General: awake, alert X 3  Cardiovascular: regular rate and rhythm, no murmurs, rubs or gallops  Lungs: clear to auscultation bilaterally, without wheezing, rhonchi, or rales  Abdomen: soft, non-tender, non-distended; no palpable masses,  normoactive bowel sounds  Extremities: no edema  Neuro:  Cranial nerves grossly intact, sensation intact, strength 5/5 bilaterally upper and lower extremities          Meds:     Current Facility-Administered Medications   Medication Dose Route Frequency    aspirin  81 mg Oral Daily    atorvastatin  40 mg Oral QHS    enoxaparin  40 mg Subcutaneous Daily     Labs:     Recent Labs     08/30/19  0231 08/29/19  1221   WBC 6.13 7.47   Hgb 12.6 13.4   Hematocrit 38.1 39.9   Platelets 296 307   MCV 96.2* 96.1*     Recent Labs     08/30/19  0231 08/29/19  1221  Sodium 136 142   Potassium 4.0 4.4   Chloride 101 104   CO2 20* 26   BUN 16.0 15.8   Creatinine 1.2* 1.2*   Glucose 110* 117*   Calcium 9.3 9.5     No results for input(s): AST, ALT, ALKPHOS, PROT, ALB in the last 72 hours.  Recent Labs     08/30/19  0231 08/29/19  1221   PTT 30 27   PT 13.1 12.4*   PT INR 1.0 0.9     Imaging personally reviewed.    Safety Checklist:     DVT prophylaxis:  CHEST guideline (See page [phone]) Chemical   Foley:  [place] R[place] Foley protocol Not present   IVs:  Peripheral IV   PT/OT: Ordered   Daily CBC & or Chem ordered:  SHM/ABIM guidelines (see #5) No   Reference for approximate charges of common labs: CBC auto diff - $76   BMP - $99   Mg - $79  Disposition:   Today's date: 08/30/2019   Admit Date: 08/29/2019 12:12 PM  Anticipated medical stability for discharge:Yellow - maybe tomorrow  Service status: Observation:patient is stable and improving.  Reason for ongoing hospitalization: MRI, MRA   Anticipated discharge needs: TBD  I have discussed with Attending: Viviann Spare, MD  Signed by: Morley Kos, FNP  Adult  Observation Unit Nurse Practitioner  [phone] or [phone] on NT6   I have Reviewed the interval history, images and pertinent test results and personally examined the patient and confirmed the major physical findings of the preceding mid-levels note. Agree with above

## 2019-08-30 NOTE — OT Eval Note (Signed)
[facility]   Occupational Therapy Evaluation     Patient: Rachel Diaz    MRN#: [id number]   Unit: [facility] TOWER 6  Bed: V409/W119.14                                     Post Acute Care Therapy Recommendations:   Discharge Recommendations: Home with no needs     Milestones to be reached to achieve recommendation: none  Anticipate achievement in n/a sessions    DME Recommended for Discharge: (none from OT standpoint )    Therapy discharge recommendations may change with patient status.  Please refer to most recent note for up-to-date recommendations.    Assessment:   Significant Findings: none    Rachel Diaz is a 69 y.o. female admitted 08/29/2019.  Patient presents close/at functional baseline during functional transfers and ADLs. Pt denies visual deficits and/or B UE numbness/tingling. Pt demonstrates intact B UE coordination/sensation. Pt A&Ox4 and reports living with spouse and independent with ADLs/IADLs PTA. Pt completes self care/grooming and functional transfers from various surfaces with modI-independence. No acute care OT needs at this time. Please re-consult if change in pt's functional status. D/c OT.     Therapy Diagnosis: OT evaluation for decreased ADLs    Rehabilitation Potential: n/a    Treatment Activities: OT evaluation  Educated the patient to role of occupational therapy, plan of care, goals of therapy and safety with mobility and ADLs.    Plan:   OT Frequency Recommended: one time visit - therapy discontinued     Treatment/Interventions: OT evaluation     Risks/benefits/POC discussed with patient        Precautions and Contraindications:   Falls  Heel WB on R LE per pt report      Consult received for Clifton Custard for OT Evaluation and Treatment.  Patients medical condition is appropriate for Occupational Therapy intervention at this time.      History of Present Illness:    Rachel Diaz is a 69 y.o. female admitted on 08/29/2019 with "history of ADHD on daily  Adderall, allergies, asthma related to allergies presents with acute onset of right-sided paresthesia.  CT head negative" per h&P    Admitting Diagnosis: TIA (transient ischemic attack) [G45.9]    Past Medical/Surgical History:  Past Medical History:   Diagnosis Date    Asthma     Hyperlipidemia      Past Surgical History:   Procedure Laterality Date    BREAST BIOPSY      TONSILLECTOMY      TOTAL KNEE ARTHROPLASTY  B/L 2009, 2010       Imaging/Tests/Labs:  Ct Head Wo Contrast    Result Date: 08/29/2019   No acute intracranial abnormality. Georgann Housekeeper, MD  08/29/2019 12:38 PM        Social History:   Prior Level of Function: independent   Assistive Devices: none  Baseline Activity: see above  DME Currently at Home: none  Home Living Arrangements: lives with spouse   Type of Home: multi level house   Home Layout: walk-in shower     Subjective: "not that fun stuff"     Patient is agreeable to participation in the therapy session.     Patient Goal: return home  Pain:   Scale: pt denies   Objective:   Patient is in bed with R LE boot  in place.  Pt wore mask during therapy session:Yes    Cognitive Status and Neuro Exam:  A&Ox4  Follows commands appropriately     Musculoskeletal Examination  RUE ROM: WFL  LUE ROM: WFL    RUE Strength: 5/5  LUE Strength: 5/5      Sensory/Oculomotor Examination  Auditory: intact   Tactile: intact   Vision: intact       Activities of Daily Living  Eating: independent   Grooming: independent   Bathing: independent   UE Dressing: independent   LE Dressing: independent   Toileting: independent     Functional Mobility:  Supine to Sit: independent   Sit to Stand: independent   Transfers: independent     PMP Activity: Step 6 - Walks in Room      Balance  Static Sitting: good  Dynamic Sitting: good   Static Standing: good   Dynamic Standing: good    Participation and Activity Tolerance  Participation Effort: good   Endurance: good    Patient left with call bell within reach, all needs met, SCDs  off as found, fall mat in place, and bed alarm off as found and all questions answered. RN notified of session outcome and patient response.       Goals: d/c OT                                    PPE worn during session: procedural mask, goggles and gloves    Tech present: no  PPE worn by tech: N/A        Mitchel Honour OTR/L  Pager [phone]         Time of treatment:   OT Received On: 08/30/19  Start Time: 1054  Stop Time: 1110  Time Calculation (min): 16 min

## 2019-08-30 NOTE — Plan of Care (Signed)
Problem: Safety  Goal: Patient will be free from injury during hospitalization  Outcome: Progressing  Flowsheets (Taken 08/30/2019 1049)  Patient will be free from injury during hospitalization:   Assess patient's risk for falls and implement fall prevention plan of care per policy   Provide and maintain safe environment   Use appropriate transfer methods   Ensure appropriate safety devices are available at the bedside   Include patient/ family/ care giver in decisions related to safety   Assess for patients risk for elopement and implement Elopement Risk Plan per policy  Goal: Patient will be free from infection during hospitalization  Outcome: Progressing  Flowsheets (Taken 08/30/2019 1049)  Free from Infection during hospitalization:   Assess and monitor for signs and symptoms of infection   Monitor lab/diagnostic results   Monitor all insertion sites (i.e. indwelling lines, tubes, urinary catheters, and drains)   Encourage patient and family to use good hand hygiene technique     Problem: Pain  Goal: Pain at adequate level as identified by patient  Outcome: Progressing  Flowsheets (Taken 08/30/2019 1049)  Pain at adequate level as identified by patient:   Identify patient comfort function goal   Assess pain on admission, during daily assessment and/or before any "as needed" intervention(s)   Reassess pain within 30-60 minutes of any procedure/intervention, per Pain Assessment, Intervention, Reassessment (AIR) Cycle   Evaluate if patient comfort function goal is met   Evaluate patient's satisfaction with pain management progress     Problem: Side Effects from Pain Analgesia  Goal: Patient will experience minimal side effects of analgesic therapy  Outcome: Progressing  Flowsheets (Taken 08/30/2019 1049)  Patient will experience minimal side effects of analgesic therapy:   Monitor/assess patient's respiratory status (RR depth, effort, breath sounds)   Assess for changes in cognitive function     Problem: Discharge  Barriers  Goal: Patient will be discharged home or other facility with appropriate resources  Outcome: Progressing  Flowsheets (Taken 08/30/2019 1049)  Discharge to home or other facility with appropriate resources:   Provide appropriate patient education   Initiate discharge planning     Problem: Psychosocial and Spiritual Needs  Goal: Demonstrates ability to cope with hospitalization/illness  Outcome: Progressing  Flowsheets (Taken 08/30/2019 1049)  Demonstrates ability to cope with hospitalizations/illness:   Encourage verbalization of feelings/concerns/expectations   Provide quiet environment   Assist patient to identify own strengths and abilities   Encourage patient to set small goals for self     Problem: Day of Admission - Stroke  Goal: Core/Quality measure requirements - Admission  Outcome: Progressing  Flowsheets (Taken 08/30/2019 1049)  Core/Quality measure requirements - Admission:   Document NIH Stroke Scale on admission   Document nursing swallow/dysphagia screen on admission. If patient fails, keep patient NPO (follow your hospital protocol on swallowing screening).   VTE Prevention: Ensure anticoagulant(s) administered and/or anti-embolism stockings/devices documented as ordered   Ensure antithrombotic administered or contraindication documented by LIP   Begin stroke education on admission (must include Modifiable Risk Factors, Warning Signs and Symptoms of Stroke, Activation of Emergency Medical System and Follow-up Appointments) Ensure handout has been given and documented.   Ensure PT/OT and/or SLP ordered     Problem: Every Day - Stroke  Goal: Core/Quality measure requirements - Daily  Outcome: Progressing  Flowsheets (Taken 08/30/2019 1049)  Core/Quality measure requirements - Daily:   VTE Prevention: Ensure anticoagulant(s) administered and/or anti-embolism stockings/devices documented by end of day 2   Ensure antithrombotic administered or contraindication  documented by LIP by end of day 2   Once  lipid panel has resulted, check LDL. Contact provider for statin order if LDL > 70 (or ensure contraindication documented by LIP).   Continue stroke education (must include Modifiable Risk Factors, Warning Signs and Symptoms of Stroke, Activation of Emergency Medical System and Follow-up Appointments). Ensure handout has been given and documented.  Goal: Neurological status is stable or improving  Outcome: Progressing  Flowsheets (Taken 08/30/2019 1049)  Neurological status is stable or improving:   Monitor/assess/document neurological assessment (Stroke: every 4 hours)   Monitor/assess NIH Stroke Scale   Re-assess NIH Stroke Scale for any change in status   Observe for seizure activity and initiate seizure precautions if indicated  Goal: Patient's risk of aspiration will be minimized  Outcome: Progressing  Flowsheets (Taken 08/30/2019 1049)  Patient's risk of aspiration will be minimized:   Monitor/assess for signs of aspiration (tachypnea, cough, wheezing, clearing throat, hoarseness after eating, decrease in SaO2   Place patient up in chair to eat, if possible   Keep head of bed up a minimum of 30 degrees when hemodynamically stable  Goal: Neurovascular status is stable or improving  Outcome: Progressing  Flowsheets (Taken 08/30/2019 1049)  Neurovascular status is stable or improving:   Monitor/assess neurovascular status (pulses, capillary refill, pain, paresthesia, presence of edema)   Monitor/assess for signs of Venous Thrombus Emboli (edema of calf/thigh redness, pain)     Problem: Day of Discharge - Stroke  Goal: Able to express understanding of discharge instructions and stroke education  Outcome: Progressing  Flowsheets (Taken 08/30/2019 1049)  Able to express understanding of discharge instructions and stroke education:   Include patient care companion in decisions related to communication   Ensure "Discharge instructions: Stroke" appears on discharge paperwork  (After Visit Summary-AVS)     Problem:  Moderate/High Fall Risk Score >5  Goal: Patient will remain free of falls  Outcome: Progressing  Flowsheets (Taken 08/30/2019 1049)  VH Moderate Risk (6-13):   ALL REQUIRED LOW INTERVENTIONS   INITIATE YELLOW "FALL RISK" SIGNAGE   YELLOW NON-SKID SLIPPERS   YELLOW "FALL RISK" ARM BAND   USE OF BED EXIT ALARM IF PATIENT IS CONFUSED OR IMPULSIVE. PLACE RESET BED ALARM SIGN ABOVE BED   PLACE FALL RISK LEVEL ON WHITE BOARD FOR COMMUNICATION PURPOSES IN PATIENT'S ROOM   Include family/significant other in multidisciplinary discussion regarding plan of care as appropriate   Remain with patient during toileting   Use chair-pad alarm device   Use of floor mat   Use of "STOP ask for help" sign

## 2019-08-30 NOTE — UM Notes (Signed)
69 YEAR OLD FEMALE CAME TO [facility] ER WITH C/O ACUTE R SIDED NUMBNESS STARTING WHEN SHE AWOKE.  FELT HER FACE AND RUE WERE NUMB. SX HAVE RESOLVED.    VS- BP- 185/88, P- 88, R- 16, T- 98.4    EKG-  NORMAL SINUS RHYTHM   POSSIBLE   LEFT ATRIAL ENLARGEMENT   BORDERLINE NORMAL/ABNORMAL ELECTROCARDIOGRAM   WHEN COMPARED WITH ECG OF   08-Feb-2009 10:54,   NO SIGNIFICANT CHANGE WAS FOUND     LABS- CR 1.2, CHOL 262 , LDL 161, TRIGLYCERIDES 220, TROP 0.01,     CT HEAD-No acute intracranial abnormality.    PMH-ASTHMA, HLD, BREAST BX, TONSILLECTOMY, TKR    PLAN- ADMIT TO OBSERVATION CLASS 08/29/2019 1256    Admit for observation  Neuro checks  Swallow screen  Low-dose aspirin and statin  Check MRI/MRA as per stroke protocol  Risk stratification-check lipid profile, hemoglobin A1c, TSH, urine analysis    Hold Adderall for now  Lovenox for DVT prophylaxis  Full code    Adult Admit to Observation (Order #[id number]) on 08/29/19    Transient Ischemic Attack (TIA): Observation Care - Observation Care Admission Criteria    UTILIZATION REVIEW CONTACT: Name: PAM Dorothia Passmore  Clinical Case Manager  - Utilization Review  [facility]  Address:  [address] [place], [place]  [address]  NPI:   [id number]  Tax ID:  [id number]  Phone: [phone]  Fax: [phone]    Please use fax number [phone] to provide authorization for hospital services or to request additional information.        NOTES TO REVIEWER:     This clinical review is based on/compiled from documentation provided by the treatment team within the patients medical record.

## 2019-08-30 NOTE — Progress Notes (Signed)
08/30/19 1122   CM Review   Acknowledgment of Outpatient/Observation Observation letter given   Opt out of singing left copy with patient

## 2019-08-30 NOTE — Progress Notes (Signed)
PROGRESS NOTE    Date Time: 08/30/19 10:51 AM  Patient Name: Rachel Diaz, Rachel Diaz    Full note to be dictated      This is a 69 year old female with history of ADD, depression, recent stress due to the loss of several family members.  Yesterday she developed some slowness of her speech followed by right-sided body paresthesias.  She concern about stroke-like symptoms she presented to the ED.  She is currently doing well. She is currently doing well.    Impression  Suspect TIA vs acute stress reaction  ADD  Depression  Recent right foot injury  Hyperlipidemia  Asthma    Recommendations  We will continue on baby aspirin and statins  MRI and MRA pending  PT evaluation  Possible discharge plans to home today once MRI studies are negative  She looks stable from neurological standpoint    Thank you      Discussed with Patient/Family/Staff/Consultants  Signed by: Georganna Skeans, MD

## 2019-08-30 NOTE — SLP Eval Note (Signed)
[facility]   Speech and Language Therapy Evaluation     Patient: Rachel Diaz    MRN#: [id number]     Consult received for Rachel Diaz for SLP Evaluation and Treatment.    Assessment:     Clinical Impression: Rachel Diaz is a 69 y.o. female admitted 08/29/2019 for TIA (transient ischemic attack) [G45.9] presenting with adequate speech and language skills. Pt with hx ADHD which was apparent during assessment and attributed to being baseline behavior. Verbal expression is fluent and appropriate though interruptive at times with frequent insertion of humor. Pt with mildly reduced topic maintenance but related to baseline attention deficit. Comprehension is unremarkable. Motor speech is WNL. Repetition skills are excellent. Pt with intact ST/LT memory skills and abstract reasoning. No additional SLP needs at this time.     Formal swallow evaluation deferred given pt previously passed RN dysphagia screen and tolerating diet.     Therapy Diagnosis: hx ADHD; rule out stroke/TIA    Discharge Recommendations:   Expected disposition: Recommendations: Home with supervision/assistance;No follow up required     Plan:   Plan: [facility].        Current Hospitalization:  Referring Physician: Lavonna Rua, MD  Date of Referral: 08/29/2019    Medical Diagnosis: TIA (transient ischemic attack) [G45.9]    History of Present Illness: Rachel Diaz is a 69 y.o. female admitted on 08/29/2019 with "history of ADHD on daily Adderall, allergies, asthma related to allergies presents with acute onset of right-sided paresthesia.  CT head negative" per H&P. SLP consulted. Pt reports brief period "~2 hrs of difficulty getting words out".  Pt passed RN dysphagia screen and tolerating regular diet.     Depression, recently started on antidepressants     Imaging:   CT Head: 08/29/2019 IMPRESSION:    No acute intracranial abnormality.    MRI brain: ordered    Patient Active Problem List   Diagnosis    SOB (shortness of breath)    Shortness of  breath    Dyslipidemia    Family history of premature CAD    TIA (transient ischemic attack)       Past Medical/Surgical History:  Past Medical History:   Diagnosis Date    Asthma     Hyperlipidemia       Past Surgical History:   Procedure Laterality Date    BREAST BIOPSY      TONSILLECTOMY      TOTAL KNEE ARTHROPLASTY  B/L 2009, 2010        Social History:  Prior Level of Function  Prior level of function: Independent with ADLs, Ambulates independently  Baseline Activity Level: Community ambulation  Driving: independent  Employment: Retired  DME Currently at Home: ADL- Midwife  Living Arrangements: Spouse/significant other  Type of Home: House  Home Layout: Multi-level(2 floors + basement)  Bathroom Shower/Tub: Cabin crew: Paediatric nurse  DME Currently at Home: ADL- Shower Chair    Subjective:   Patient is agreeable to participation in the therapy session. Nursing clears patient for therapy. Patients medical condition is appropriate for Speech therapy intervention at this time.     PAIN:  denies    Objective:   Observation of Patient/Vital Signs:  Patient is in bed with dressings in place.  Interpreter services required: N/A  Patient was wearing a mask: Yes ; but removed for portion of motor speech assessment and oral motor exam    Cognitive  Status and Neuro Exam:  Orientation: 4/4  Attention: baseline ADHD; observed I conversation and structured tasks  ST memory: adequate recall of recent events  Convergent naming: 5/5  Divergent naming  16 animals in 1 minute; distracted at times  LT memory: WFL  Abstract reasoning: 5/5  Safety-awareness/insight: appears intact    Oral Motor Assessment:  WNL; no gross facial asymmetry. 100% intelligible.   DDK WNL  AMR=SMR     Auditory Comprehension:   1 step commands: 3/3  2 step commands: 3/3  3 step commands: 3/3  Simple yes/no questions: 3/3  Abstract yes/no questions: 5/5    Expression:  Verbal     Verbal  Expression:  Fluent, Related, tangential at times. Somewhat reduced topic maintanence but attributed to hx ADHD  Automatics: WNL  Naming objects:  7/7  Responsive naming: 7/7   Repetition: 100% sentence level    Written Expression:   not assessed    Pragmatics:   Somewhat reduced topic maintanence but attributed to hx ADHD   Occasional interruptions  Adequate eye contact and verbal initiation    Behavior:  Pleasant, frequently attempting to insert humor/jokes  Appears to be hyperactive    Educated the patient to role of speech therapy, plan of care, goals of therapy and recommendations.    Patient left with call bell within reach, all needs met, fall mat in place and bed alarm activated and all questions answered. RN notified of session outcome and patient response.    Goals: N/A    Marcia Brash MS, CCC-SLP  PPE Worn by Provider: gloves, face mask and goggles        Time of treatment:   SLP Received On: 08/30/19  Start Time: 1120  Stop Time: 1144  Time Calculation (min): 24 min

## 2019-08-30 NOTE — Progress Notes (Signed)
CASE MANAGEMENT IDPA OBS and Low Risk      Patient: Rachel Diaz (69 y.o. female)  Admission Date: 08/29/2019 [facility] Day 0)    Active Hospital Problems    Diagnosis    TIA (transient ischemic attack)       Length of stay: Hospital Day 0    Discussed plan of care and possible discharge needs with patient/family/care-giver Jeannie Fend, N) - if family/care-giver, provide name:  Yes, with patient at bedside    Patient has cognitive ability to participate in care decisions and follow-up care as needed (Y, N) - provide comments if no:  yes    Plan of care:  Discharge Plan A:  Home, self-care  Discharge Plan B:    Barriers to discharge:  none              a) Barriers needing escalation:              b) Escalated to:     Verified (and corrected if needed) demographics and PCP on facesheet (Y, N)  yes    Patient is NOT identified as a Medicare focused patient or high risk for failed discharge plan or readmission as detailed in the High Risk Patient Screening policy (Y, N)  yes    Comments: Patient lives at home w/ her husband, her son and 2 grandchildren (ages 78 & 7). She is independent in all adls/iadls. She is able to drive to appointments. She uses no DME. She indicates no needs at discharge. Her husband will be her transport home.     CM will continue to follow for any change in needs.     Anastasia Pall, BSN, RN, ACM  NT 6 OBS Case Manager  Case Management   [facility]  [phone]

## 2019-08-30 NOTE — Plan of Care (Signed)
Pt is aware of safety measures for fall. Pt is understanding of importance of compliance with new meds to prevent stroke.

## 2019-08-31 DIAGNOSIS — I639 Cerebral infarction, unspecified: Secondary | ICD-10-CM | POA: Diagnosis present

## 2019-08-31 LAB — BASIC METABOLIC PANEL
Anion Gap: 13 (ref 5.0–15.0)
BUN: 16 mg/dL (ref 7.0–19.0)
CO2: 22 mEq/L (ref 22–29)
Calcium: 9.5 mg/dL (ref 7.9–10.2)
Chloride: 103 mEq/L (ref 100–111)
Creatinine: 1.1 mg/dL — ABNORMAL HIGH (ref 0.6–1.0)
Glucose: 122 mg/dL — ABNORMAL HIGH (ref 70–100)
Potassium: 4.1 mEq/L (ref 3.5–5.1)
Sodium: 138 mEq/L (ref 136–145)

## 2019-08-31 LAB — GFR: EGFR: 49

## 2019-08-31 MED ORDER — ASPIRIN 325 MG PO TABS
325.0000 mg | ORAL_TABLET | Freq: Every day | ORAL | Status: DC
Start: 2019-08-31 — End: 2019-09-01
  Administered 2019-08-31: 11:00:00 325 mg via ORAL
  Filled 2019-08-31: qty 1

## 2019-08-31 NOTE — Consults (Signed)
Service Date: 08/30/2019     Patient Type: V     CONSULTING PHYSICIAN: Georganna Skeans MD     REFERRING PHYSICIAN: Lavonna Rua MD     CONSULTING SERVICE:  Neurology.     REASON FOR REFERRAL:  Evaluation for right-sided paresthesias and speech disturbances.     HISTORY OF PRESENT ILLNESS:  This is a 69 year old female with history of ADD on Adderall, depression,  anxiety, recently she lost several family members and she has been under  high level of stress.  Yesterday, she developed some slowness of her  speech, followed by right-sided body paresthesias, including upper and  lower extremities.  She is concerning about stroke-like symptoms.  She  presented to the emergency room.  CT scan of the head was done, negative.   She is scheduled to have MRI, MRA studies.     Recently, she also injured her right foot and she has a special boot placed  for the last several weeks.     PAST MEDICAL HISTORY:  ADD, anxiety, depression, hyperlipidemia, asthma.     ALLERGIES:  MORPHINE, ORPHENADRINE, PERCOCET.     CURRENT MEDICATIONS:  Amlodipine, aspirin, atorvastatin, Lovenox.     NEUROLOGICAL REVIEW OF SYSTEMS:  No headache, no dizziness, no swallowing difficulties, no speech problems.  Recently, she has been depressed due to family issues due to recently she  lost several family members secondary to medical conditions.  No swallowing  difficulties.  She is currently wearing special boot for the right foot  problems.  No chest pain, no shortness of breath, no abdominal symptoms.     SOCIAL HISTORY:  She lives at home with family.     PHYSICAL EXAMINATION:  VITAL SIGNS:  Stable.  GENERAL:  This is an elderly female, not in distress, looks comfortable.   She is sitting in a chair.  NEUROLOGIC:  Alert, awake, oriented x3.  Pupils 3 mm, equally reactive to  light, no nystagmus is noted.  Extraocular movements intact.  Peripheral  field of vision is normal.  Speech is clear and fluent.  Cranial nerve  examination II through XII  intact.  Facial strength and sensation is  intact.  Tongue, jaw, palate move symmetrically.  Motor examination:  Tone  is normal.  Power is 5/5 in all 4 extremities except for right foot  strength is not tested.  Gait is not tested.  Sensory examination is within  normal limits.  Coordination:  Finger-to-nose testing is normal.  Reflexes  are depressed.  CARDIOVASCULAR:  S1, S2 normal intensity.  LUNGS:  Clear to auscultation.  ABDOMEN:  Soft, benign.     IMPRESSION:  This is a 69 year old female with history of attention deficit disorder,  depression, recent stress, admitted to the hospital with sudden onset of  slowness of her speech, followed by left-sided paresthesias.  Current  neurological examination is nonfocal.  1.  Suspect transient ischemic attack versus acute stress reaction.  2.  Attention deficit disorder.  3.  Depression and anxiety.  4.  Recent right foot injury.  5.  Hyperlipidemia, newly diagnosed.  6.  Asthma.     RECOMMENDATIONS:  We will continue on baby aspirin and statins.  MRI, MRA studies are  pending.  Physical therapy evaluation.  Possible discharge plans to home  once MRI studies are negative.  We will continue to closely follow her and  make additional recommendations.     Thank you, Dr. Elpidio Anis, for allowing me to participate in your patient's  care.           D:  08/30/2019 21:43 PM by Dr. Georganna Skeans, MD ([id number])  T:  08/31/2019 04:11 AM by NTS      (Conf: [id number]) (Doc ID: [id number])

## 2019-08-31 NOTE — Discharge Instr - AVS First Page (Addendum)
Reason for your Hospital Admission:  Stroke like symptoms  Acute Stroke    Your MRI imaging showed a small stroke and narrowing or stenosis of one of the blood vessels in your brain from plaque buildup.  We checked for risk factors of stroke and you were found to have uncontrolled cholesterol and elevated blood pressure.  You were started on medication to reduce your stroke risk in the future.  Our physical and occupational therapist evaluated you and said you did not have any needs at home.  Our neurology team evaluated you and has cleared you for discharge.     Instructions for after your discharge:  -- Schedule a follow up visit with your primary care provider - within 1 week of discharge from the hospital.  -- Follow up with Neurology within 2-3 weeks for reevaluation.   -- Please have your have your Cholesterol and Hemoglobin A1C rechecked in 3-6 months.  -- NEW: Start aspirin 81 mg, Plavix 75 mg, and Lipitor 40 mg for stroke prevention  -- NEW: Start amlodipine 5 mg for blood pressure control  -- Your home medications were adjusted during this hospital stay, please make sure you review your updated medication list. Share this with your Outpatient medical team at your next follow up visits.   -- Please bring your medication in bottles or a list of medications, with name, dosage, and frequency to all your follow up appointments  -- Check and Record blood pressure and pulse twice daily and when not feeling well, take log with you to primary care visit/follow up.  -- Please avoid all NSAIDs including Ibuprofen, Motrin, Aleve, Advil, Naproxen while you are taking Plavix and aspirin  -- For mild to moderate pain, you may use OTC Tylenol.    -- Hydrate well, activity as tolerated.  -- See attached for dietary education.   -- Return to the nearest emergency department for chest pain, shortness of breath, fever >100.5 degrees, dizziness, passing out, profuse vomiting or diarrhea, or any other concerning symptoms

## 2019-08-31 NOTE — Progress Notes (Signed)
PROGRESS NOTE    Date Time: 08/31/19 3:04 PM  Patient Name: Rachel Diaz,Rachel Diaz      Subjective:     Patient states she is feeling better  No reported speech disturbances  He feels that he has some short-term memory problems seen she been hospitalized  MRI brain showed left hippocampus stroke  MRA head revealed  focal high-grade  stenosis in the P2 segments of the left PCA. The distalmost P3 segment   is occluded.     Medications:     Current Facility-Administered Medications   Medication Dose Route Frequency    amLODIPine  5 mg Oral Daily    aspirin  325 mg Oral Daily    atorvastatin  40 mg Oral QHS    enoxaparin  40 mg Subcutaneous Daily       Review of Systems:   A comprehensive review of systems was:    No headache, no dizziness, no swallowing difficulties, no speech problems.  Recently, she has been depressed due to family issues due to recently she  lost several family members secondary to medical conditions.  No swallowing  difficulties.  She is currently wearing special boot for the right foot  problems.  No chest pain, no shortness of breath, no abdominal symptoms.  Short term memory problems from admission.    Physical Exam:     Vitals:    08/31/19 1053   BP: 137/67   Pulse: 82   Resp: 18   Temp: 98.4 F (36.9 C)   SpO2: 97%       Intake and Output Summary (Last 24 hours) at Date Time    Intake/Output Summary (Last 24 hours) at 08/31/2019 1504  Last data filed at 08/31/2019 0300  Gross per 24 hour   Intake 240 ml   Output --   Net 240 ml       Physical Exam   GENERAL:  This is an elderly female, not in distress, looks comfortable.   She is sitting in a chair.  NEUROLOGIC:  Alert, awake, oriented x3.  Pupils 3 mm, equally reactive to  light, no nystagmus is noted.  Extraocular movements intact.  Peripheral  field of vision is normal.  Speech is clear and fluent.  Cranial nerve  examination II through XII intact.  Facial strength and sensation is  intact.  Tongue, jaw, palate move symmetrically.  Motor  examination:  Tone  is normal.  Power is 5/5 in all 4 extremities except for right foot  strength is not tested.  Gait is not tested.  Sensory examination is within  normal limits.  Coordination:  Finger-to-nose testing is normal.  Reflexes  are depressed.  CARDIOVASCULAR:  S1, S2 normal intensity.  LUNGS:  Clear to auscultation.  ABDOMEN:  Soft, benign.    Labs:     Recent CBC No results for input(s): RBC, HGB, HCT, MCV, MCH, MCHC, RDW, MPV, LABPLAT in the last 24 hours.    Invalid input(s): WHITEBLOODCE,  NRBCA,  REFLX,  ANRBA  Recent BMP   Recent Labs     08/31/19  0251   Glucose 122*   BUN 16.0   Creatinine 1.1*   Calcium 9.5   Sodium 138   Potassium 4.1   Chloride 103   CO2 22       Rads:   Radiological Procedure reviewed.  MRI Angiogram Head WO Contrast [[id number]] Collected: 08/30/19 2221     Order Status: Completed Updated: 08/30/19 2303    Narrative:  HISTORY: CVA     COMPARISON: None     TECHNIQUE: MRA of the head was performed using 3D time-of-flight   technique.     FINDINGS: The terminal portions of both internal carotid arteries are   patent.     The bilateral anterior and middle cerebral arteries are unremarkable   along their proximal aspects.     Posteriorly, the intradural vertebral and basilar artery are   unremarkable. The right PCA is unremarkable. There is a focal high-grade   stenosis in the P2 segments of the left PCA. The distalmost P3 segment   is occluded.     No aneurysm is identified.         Impression:        1. The anterior intracranial circulation is unremarkable.   2. Focal high-grade stenosis in the P2 segment of the left PCA. Portions   of the left P3 segment appear occluded.     Trilby Drummer, MD   08/30/2019 11:01 PM    MRI Brain WO Contrast [[id number]] Collected: 08/30/19 2218    Order Status: Completed Updated: 08/30/19 2303    Narrative:     HISTORY: Paresthesias     COMPARISON: MRI on 01/15/2016     TECHNIQUE: MRI examination of the brain was performed using  standard   technique.     FINDINGS:   The prior study is an acute infarct in the left hippocampus. The major   portions of it measure 3.0 x 1.1 cm (AP x width). There is no   significant mass effect from it. No other acute infarct is identified.     There are no abnormal fluid collections. There are no masses. There is   no mass effect or midline shift. Ventricles and sulci are   age-appropriate. Minor chronic ischemic changes are present. The   intracranial flow voids are grossly normal. The gray-white matter   junctions are unremarkable. Clival and calvarial marrow signal is   normal.         Impression:        1. Diffusion restriction within the left hippocampus, likely an acute   infarct.   2. Otherwise, no change from prior study.   3. Minor chronic ischemic changes are present.     Trilby Drummer, MD   08/30/2019 11:01 PM    MRI Angiogram Neck WO Contrast [[id number]] Collected: 08/30/19 2223    Order Status: Completed Updated: 08/30/19 2227    Narrative:     HISTORY: Paresthesias     COMPARISON: None.     TECHNIQUE: MRA Neck was performed without intravenous contrast   utilization.     Any proximal internal carotid artery narrowing was determined utilizing   the distal internal carotid artery as a reference, similar to the NASCET   methodology.     FINDINGS:    The aortic arch is grossly normal.     The right common carotid artery is patent. There is no stenosis of the   proximal right internal carotid artery based on NASCET criteria. The   cervical ICA is patent.      The left common carotid artery is patent. There is no stenosis of the   proximal right internal carotid artery based on NASCET criteria. The   cervical ICA is patent.     The bilateral vertebral arteries are unremarkable. Both vessels follow   normal course and caliber through their suboccipital segments.         Impression:  1. There is no stenosis of the right common carotid artery bifurcation   and proximal right internal  carotid artery carotid artery based on   NASCET criteria.    2. There is no stenosis of the left common carotid artery bifurcation   and proximal left internal carotid artery carotid artery based on   NASCET criteria.   3. The neck vertebral arteries are patent.     Trilby Drummer, MD   08/30/2019 10:25 PM       Assessment:     Patient Active Problem List    Diagnosis Date Noted    Acute CVA (cerebrovascular accident) 08/31/2019    Acute cerebrovascular accident (CVA) 08/31/2019    Intracranial vascular stenosis 08/29/2019    Shortness of breath 04/24/2014    Dyslipidemia 04/24/2014    Family history of premature CAD 04/24/2014    SOB (shortness of breath) 03/07/2012     1. 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 Focal high-grade stenosis in the P2 segment of the left PCA. Portions of the left P3 segment appear occluded.\  3. Memory disturbances, secondary to stroke  4. Hyperlipidemia, uncontrolled  5. Asthma    Plan:   1. She received full dose aspirin today  2. Given high-grade intracranial stenosis of P2 segment and P3 segment occlusion I would think dual therapy may improve if underlying atherothrombosis for 1 to 3 months  3. Echocardiogram is pending  4. Will observe her today  5. Possible discharge plans to home tomorrow  6. Discussed with her in detail  Discussed with Patient/Family/Staff/Consultants  Signed by: Georganna Skeans, MD

## 2019-08-31 NOTE — Plan of Care (Signed)
Pt understands signs and symptoms of stroke.

## 2019-08-31 NOTE — Progress Notes (Signed)
MEDICINE PROGRESS NOTE    Date Time: 08/31/19 2:03 PM  Patient Name: Rachel Diaz  Attending Physician: Viviann Spare, MD    CC: Denies symptoms, resolved paresthesias    Assessment:   Hospital Course  Rachel Diaz is a 69 y.o. female with a PMHx of ADD, depression, asthma, admitted 08/29/2019 for right-sided paresthesia and delayed speech.  Initial work-up was notable for WBC 7.4, Hgb 13.4, HCT 39.9, BG 117, CR 1.2, NA 142, troponin negative.  CT head was negative for acute abnormalities.  She was admitted for stroke rule out.  MRI/MRA head + neck showed small infarct with high-grade stenosis of P2 segment of left PCA and chronically occluded left P3 segment.  Neurology consulted and recommended full dose aspirin today.  We will convert to DAPT with baby aspirin and Plavix tomorrow, continue statin on discharge.  Worked up risk factors for CVA found uncontrolled lipid panel and uncontrolled BP.  She was started on amlodipine with improvement of BP.  Patient changed to inpatient status and TTE ordered per protocol.    Interval History:  3/5:  Ct head no acute process. MRI brain, MRA H&N pending.    3/6: Started on full-strength aspirin, plan for DAPT tomorrow.  Continue amlodipine.  TTE pending to complete stroke work-up.  Neurology following.    Plan:   #Acute CVA; symptoms resolved  #Intracranial stenosis  -Change to inpatient  -Risk factor modifications  -Full ASA today; plan for DAPT with baby aspirin and Plavix per neurology tomorrow  -TTE pending   -Dr. Kristopher Oppenheim following  -Will need statin on discharge  -PT/OT state no needs    #Elevated blood pressure without diagnosis of hypertension  -Continue amlodipine started here; will need on discharge    DVT ppx: LOVENOX and SCD  Expected [place]: Likely [place] in AM    Please see attending note that follows this mid-level encounter note.   Review of Systems:   Review of Systems - Denies headache, numbness/tingling, chest pain, fever/chills, nausea/vomiting, abdominal pain,  diarrhea  ROS  Physical Exam:   Physical Exam  VITAL SIGNS PHYSICAL EXAM   Temp:  [98.4 F (36.9 C)-98.7 F (37.1 C)] 98.4 F (36.9 C)  Heart Rate:  [79-82] 82  Resp Rate:  [18] 18  BP: (137-165)/(67-90) 137/67  Blood Glucose:    Telemetry: Sinus      Intake/Output Summary (Last 24 hours) at 08/31/2019 1403  Last data filed at 08/31/2019 0300  Gross per 24 hour   Intake 240 ml   Output --   Net 240 ml    Physical Exam  General: awake, alert X 3  Cardiovascular: regular rate and rhythm, no murmurs, rubs or gallops  Lungs: clear to auscultation bilaterally, without wheezing, rhonchi, or rales  Abdomen: soft, non-tender, non-distended; no palpable masses,  normoactive bowel sounds  Extremities: no edema  Neuro: CN II-XII intact, no facial droop.  Sensation intact.  Strength equal bilateral.        Meds:     Current Facility-Administered Medications   Medication Dose Route Frequency    amLODIPine  5 mg Oral Daily    aspirin  325 mg Oral Daily    atorvastatin  40 mg Oral QHS    enoxaparin  40 mg Subcutaneous Daily     Labs:     Recent Labs     08/30/19  0231 08/29/19  1221   WBC 6.13 7.47   Hgb 12.6 13.4   Hematocrit 38.1 39.9  Platelets 296 307   MCV 96.2* 96.1*     Recent Labs     08/31/19  0251 08/30/19  0231   Sodium 138 136   Potassium 4.1 4.0   Chloride 103 101   CO2 22 20*   BUN 16.0 16.0   Creatinine 1.1* 1.2*   Glucose 122* 110*   Calcium 9.5 9.3     No results for input(s): AST, ALT, ALKPHOS, PROT, ALB in the last 72 hours.  Recent Labs     08/30/19  0231 08/29/19  1221   PTT 30 27   PT 13.1 12.4*   PT INR 1.0 0.9     Imaging personally reviewed.    Safety Checklist:     DVT prophylaxis:  CHEST guideline (See page [id number]) Chemical and Mechanical   Foley:  Sawyer Rn Foley protocol Not present   IVs:  Peripheral IV   PT/OT: Ordered   Daily CBC & or Chem ordered:  SHM/ABIM guidelines (see #5) No   Reference for approximate charges of common labs: CBC auto diff - $76   BMP - $99   Mg - $79  Disposition:   Today's  date: 08/31/2019   Admit Date: 08/29/2019 12:12 PM  Anticipated medical stability for discharge:Green - definitely tomorrow  Service status: Inpatient non-IMC Status: risk of readmission  Reason for ongoing hospitalization: TTE, complete stroke workup  Anticipated discharge needs: no needs  I have discussed with Attending: Viviann Spare, MD  Signed by: Janyth Pupa, FNP  Adult Observation Unit Nurse Practitioner  [phone] or [phone] on NT6   I have Reviewed the interval history, images and pertinent test results and personally examined the patient and confirmed the major physical findings of the preceding mid-levels note. Agree with above

## 2019-08-31 NOTE — Progress Notes (Signed)
Adult Observation Progress Note    Shift Note:     Report received from outgoing RN bedside. Re-introduced self and updated white board. Patient AOx4. Patient intermittently tachy, otherwise, VSS. IV and telemetry unit in place.  NSR.   Patient boot bedside, s/p RLE fracture.      BM during shift:    Pending Orders: Echo, q4 neuro, med team to add plavix tomorrow.    Discharge Plan: Pending medical clearance    Social/Family Visits: Husband at bedside    POC update: Plan of care updated with patient (and husband on speaker phone during rounding)      Vitals:    08/30/19 1609 08/30/19 1655 08/30/19 1923 08/31/19 1053   BP:  165/90 148/82 137/67   Pulse: 79   82   Resp: 18   18   Temp: 98.7 F (37.1 C)   98.4 F (36.9 C)   TempSrc: Oral   Oral   SpO2: 98%   97%   Weight:       Height:           Patient Lines/Drains/Airways Status    Active Lines, Drains and Airways     Name:   Placement date:   Placement time:   Site:   Days:    Peripheral IV 08/29/19 18 G Right Antecubital   08/29/19    1216    Antecubital   2

## 2019-08-31 NOTE — Nursing Progress Note (Signed)
Adult Observation Progress Note      Shift Note:Recieved pt sitting in bed, awake and alert, no distress.     General: Calm and cooperative  Neuro: Alert and oriented X4  Cardio: NSR on Tele  Resp: regular rate, unlabored, lungs clear  Integ: skin warm and dry  MSK: No problems noted. Has walking boot on right foot  GI: Pt up ad lib, last BM on 08/30/19  GU: no problems  IV Access: Right AC PIV 20 g     BM during shift: Yes  Pending Orders: Awaiting MRI results    Discharge Plan:    Social/Family Visits:    POC update: Discussed POC with Pt       Vitals:    08/30/19 1224 08/30/19 1609 08/30/19 1655 08/30/19 1923   BP:   165/90 148/82   Pulse: 80 79     Resp: 18 18     Temp: 98.6 F (37 C) 98.7 F (37.1 C)     TempSrc: Oral Oral     SpO2: 99% 98%     Weight:       Height:           Patient Lines/Drains/Airways Status    Active Lines, Drains and Airways     Name:   Placement date:   Placement time:   Site:   Days:    Peripheral IV 08/29/19 18 G Right Antecubital   08/29/19    1216    Antecubital   1

## 2019-08-31 NOTE — UM Notes (Signed)
OBS TO INPATIENT    08/31/19 0747    Admit to Inpatient    Assessment:   69 year old female with history of ADHD on daily Adderall, allergies, asthma related to allergies presents with acute onset of right-sided paresthesia.  CT head negative    Depression, recently started on antidepressants    Initial VS:  98.4, 88, 19,  99% sats,  185/88    Interval History:  3/5: Ct head no acute process. MRI brain, MRA H&N pending.   3/6: Started on full-strength aspirin, plan for DAPT tomorrow.  Continue amlodipine.  TTE pending to complete stroke work-up.  Neurology following.    Plan:   #Acute CVA; symptoms resolved  #Intracranial stenosis  -Change to inpatient  -Risk factor modifications  -Full ASA today; plan for DAPT with baby aspirin and Plavix per neurology tomorrow  -TTE pending   -Dr. Kristopher Oppenheim following  -Will need statin on discharge  -PT/OT state no needs    #Elevated blood pressure without diagnosis of hypertension  -Continue amlodipine started here; will need on discharge    Most recent VS: 98.4, 86, 18,  98% sats,  137/67    Most recent findings on MRI prompted physician to upgrade to inpatient.      MRI IMPRESSION:     1. Diffusion restriction within the left hippocampus, likely an acute  infarct.  2. Otherwise, no change from prior study.  3. Minor chronic ischemic changes are present.    Trilby Drummer, MD   08/30/2019 11:01 PM

## 2019-08-31 NOTE — Plan of Care (Signed)
Problem: Safety  Goal: Patient will be free from injury during hospitalization  Outcome: Progressing  Flowsheets (Taken 08/30/2019 1049)  Patient will be free from injury during hospitalization:   Assess patient's risk for falls and implement fall prevention plan of care per policy   Provide and maintain safe environment   Use appropriate transfer methods   Ensure appropriate safety devices are available at the bedside   Include patient/ family/ care giver in decisions related to safety   Assess for patients risk for elopement and implement Elopement Risk Plan per policy  Goal: Patient will be free from infection during hospitalization  Outcome: Progressing  Flowsheets (Taken 08/30/2019 1049)  Free from Infection during hospitalization:   Assess and monitor for signs and symptoms of infection   Monitor lab/diagnostic results   Monitor all insertion sites (i.e. indwelling lines, tubes, urinary catheters, and drains)   Encourage patient and family to use good hand hygiene technique     Problem: Discharge Barriers  Goal: Patient will be discharged home or other facility with appropriate resources  Outcome: Progressing  Flowsheets (Taken 08/30/2019 1049)  Discharge to home or other facility with appropriate resources:   Provide appropriate patient education   Initiate discharge planning     Problem: Psychosocial and Spiritual Needs  Goal: Demonstrates ability to cope with hospitalization/illness  Outcome: Progressing  Flowsheets (Taken 08/30/2019 1049)  Demonstrates ability to cope with hospitalizations/illness:   Encourage verbalization of feelings/concerns/expectations   Provide quiet environment   Assist patient to identify own strengths and abilities   Encourage patient to set small goals for self     Problem: Day of Admission - Stroke  Goal: Core/Quality measure requirements - Admission  Outcome: Progressing  Flowsheets (Taken 08/30/2019 1049)  Core/Quality measure requirements - Admission:   Document NIH Stroke Scale on  admission   Document nursing swallow/dysphagia screen on admission. If patient fails, keep patient NPO (follow your hospital protocol on swallowing screening).   VTE Prevention: Ensure anticoagulant(s) administered and/or anti-embolism stockings/devices documented as ordered   Ensure antithrombotic administered or contraindication documented by LIP   Begin stroke education on admission (must include Modifiable Risk Factors, Warning Signs and Symptoms of Stroke, Activation of Emergency Medical System and Follow-up Appointments) Ensure handout has been given and documented.   Ensure PT/OT and/or SLP ordered     Problem: Every Day - Stroke  Goal: Core/Quality measure requirements - Daily  Outcome: Progressing  Flowsheets (Taken 08/30/2019 1049)  Core/Quality measure requirements - Daily:   VTE Prevention: Ensure anticoagulant(s) administered and/or anti-embolism stockings/devices documented by end of day 2   Ensure antithrombotic administered or contraindication documented by LIP by end of day 2   Once lipid panel has resulted, check LDL. Contact provider for statin order if LDL > 70 (or ensure contraindication documented by LIP).   Continue stroke education (must include Modifiable Risk Factors, Warning Signs and Symptoms of Stroke, Activation of Emergency Medical System and Follow-up Appointments). Ensure handout has been given and documented.  Goal: Neurological status is stable or improving  Outcome: Progressing  Flowsheets (Taken 08/30/2019 1049)  Neurological status is stable or improving:   Monitor/assess/document neurological assessment (Stroke: every 4 hours)   Monitor/assess NIH Stroke Scale   Re-assess NIH Stroke Scale for any change in status   Observe for seizure activity and initiate seizure precautions if indicated  Goal: Patient's risk of aspiration will be minimized  Outcome: Progressing  Flowsheets (Taken 08/30/2019 1049)  Patient's risk of aspiration will be minimized:  Monitor/assess for signs of  aspiration (tachypnea, cough, wheezing, clearing throat, hoarseness after eating, decrease in SaO2   Place patient up in chair to eat, if possible   Keep head of bed up a minimum of 30 degrees when hemodynamically stable  Goal: Neurovascular status is stable or improving  Outcome: Progressing  Flowsheets (Taken 08/30/2019 1049)  Neurovascular status is stable or improving:   Monitor/assess neurovascular status (pulses, capillary refill, pain, paresthesia, presence of edema)   Monitor/assess for signs of Venous Thrombus Emboli (edema of calf/thigh redness, pain)     Problem: Day of Discharge - Stroke  Goal: Able to express understanding of discharge instructions and stroke education  Outcome: Progressing  Flowsheets (Taken 08/30/2019 1049)  Able to express understanding of discharge instructions and stroke education:   Include patient care companion in decisions related to communication   Ensure "Discharge instructions: Stroke" appears on discharge paperwork  (After Visit Summary-AVS)     Problem: Moderate/High Fall Risk Score >5  Goal: Patient will remain free of falls  Outcome: Progressing  Flowsheets (Taken 08/30/2019 1049)  VH Moderate Risk (6-13):   ALL REQUIRED LOW INTERVENTIONS   INITIATE YELLOW "FALL RISK" SIGNAGE   YELLOW NON-SKID SLIPPERS   YELLOW "FALL RISK" ARM BAND   USE OF BED EXIT ALARM IF PATIENT IS CONFUSED OR IMPULSIVE. PLACE RESET BED ALARM SIGN ABOVE BED   PLACE FALL RISK LEVEL ON WHITE BOARD FOR COMMUNICATION PURPOSES IN PATIENT'S ROOM   Include family/significant other in multidisciplinary discussion regarding plan of care as appropriate   Remain with patient during toileting   Use chair-pad alarm device   Use of floor mat   Use of "STOP ask for help" sign

## 2019-09-01 ENCOUNTER — Inpatient Hospital Stay (HOSPITAL_COMMUNITY): Payer: Medicare Other

## 2019-09-01 DIAGNOSIS — I639 Cerebral infarction, unspecified: Secondary | ICD-10-CM

## 2019-09-01 DIAGNOSIS — E785 Hyperlipidemia, unspecified: Secondary | ICD-10-CM

## 2019-09-01 LAB — BASIC METABOLIC PANEL
Anion Gap: 13 (ref 5.0–15.0)
BUN: 20 mg/dL — ABNORMAL HIGH (ref 7.0–19.0)
CO2: 22 mEq/L (ref 22–29)
Calcium: 9.6 mg/dL (ref 7.9–10.2)
Chloride: 102 mEq/L (ref 100–111)
Creatinine: 1.1 mg/dL — ABNORMAL HIGH (ref 0.6–1.0)
Glucose: 127 mg/dL — ABNORMAL HIGH (ref 70–100)
Potassium: 4.3 mEq/L (ref 3.5–5.1)
Sodium: 137 mEq/L (ref 136–145)

## 2019-09-01 LAB — GFR: EGFR: 49

## 2019-09-01 MED ORDER — AMLODIPINE BESYLATE 5 MG PO TABS
10.0000 mg | ORAL_TABLET | Freq: Every day | ORAL | Status: DC
Start: 2019-09-01 — End: 2019-09-01
  Administered 2019-09-01: 09:00:00 10 mg via ORAL
  Filled 2019-09-01: qty 2

## 2019-09-01 MED ORDER — AMLODIPINE BESYLATE 10 MG PO TABS
10.0000 mg | ORAL_TABLET | Freq: Every day | ORAL | 1 refills | Status: DC
Start: 2019-09-02 — End: 2019-09-01

## 2019-09-01 MED ORDER — LISINOPRIL 20 MG PO TABS
20.0000 mg | ORAL_TABLET | Freq: Every day | ORAL | 1 refills | Status: DC
Start: 2019-09-02 — End: 2019-09-01

## 2019-09-01 MED ORDER — LISINOPRIL 10 MG PO TABS
20.0000 mg | ORAL_TABLET | Freq: Every day | ORAL | Status: DC
Start: 2019-09-01 — End: 2019-09-01
  Administered 2019-09-01: 07:00:00 20 mg via ORAL
  Filled 2019-09-01 (×2): qty 2

## 2019-09-01 MED ORDER — ATORVASTATIN CALCIUM 40 MG PO TABS
40.00 mg | ORAL_TABLET | Freq: Every evening | ORAL | 1 refills | Status: AC
Start: 2019-09-01 — End: ?

## 2019-09-01 MED ORDER — ATORVASTATIN CALCIUM 40 MG PO TABS
40.0000 mg | ORAL_TABLET | Freq: Every evening | ORAL | 1 refills | Status: DC
Start: 2019-09-01 — End: 2019-09-01

## 2019-09-01 MED ORDER — ASPIRIN 81 MG PO CHEW
81.00 mg | CHEWABLE_TABLET | Freq: Every day | ORAL | Status: DC
Start: 2019-09-01 — End: 2019-09-01
  Administered 2019-09-01: 09:00:00 81 mg via ORAL
  Filled 2019-09-01: qty 1

## 2019-09-01 MED ORDER — CLOPIDOGREL BISULFATE 75 MG PO TABS
75.00 mg | ORAL_TABLET | Freq: Every day | ORAL | 1 refills | Status: AC
Start: 2019-09-02 — End: ?

## 2019-09-01 MED ORDER — CLOPIDOGREL BISULFATE 75 MG PO TABS
75.0000 mg | ORAL_TABLET | Freq: Every day | ORAL | Status: DC
Start: 2019-09-01 — End: 2019-09-01
  Administered 2019-09-01: 09:00:00 75 mg via ORAL
  Filled 2019-09-01: qty 1

## 2019-09-01 MED ORDER — AMLODIPINE BESYLATE 10 MG PO TABS
10.00 mg | ORAL_TABLET | Freq: Every day | ORAL | 1 refills | Status: AC
Start: 2019-09-02 — End: ?

## 2019-09-01 MED ORDER — LISINOPRIL 20 MG PO TABS
20.00 mg | ORAL_TABLET | Freq: Every day | ORAL | 1 refills | Status: AC
Start: 2019-09-02 — End: ?

## 2019-09-01 MED ORDER — CLOPIDOGREL BISULFATE 75 MG PO TABS
75.0000 mg | ORAL_TABLET | Freq: Every day | ORAL | 1 refills | Status: DC
Start: 2019-09-02 — End: 2019-09-01

## 2019-09-01 MED ORDER — ASPIRIN 81 MG PO CHEW
81.00 mg | CHEWABLE_TABLET | Freq: Every day | ORAL | 1 refills | Status: DC
Start: 2019-09-02 — End: 2019-09-01

## 2019-09-01 MED ORDER — ASPIRIN 81 MG PO CHEW
81.00 mg | CHEWABLE_TABLET | Freq: Every day | ORAL | 1 refills | Status: AC
Start: 2019-09-02 — End: ?

## 2019-09-01 NOTE — Nursing Progress Note (Signed)
Adult Observation Progress Note      Shift Note: Received pt, awake, alert and sitting up in bed.No distress noted.     General: Calm and cooporative  Neuro: Oriented X4  Cardio: Regular rate and rhythm,NSR on tele  Resp: Unlabored, lungs clear  Integ: Skin warm and dry   XNA:TFTDDUKGUR without assist. Pt has walking boot on right foot   GI: WNL  GU: WNL  IV Access: right AC PIV     BM during shift:no    Pending Orders: Plavix (new med) in AM    Discharge Plan: possibly this evening, D/C to home    Social/Family Visits: none    POC update: discussed discharge plan with pt      Vitals:    08/31/19 1053 08/31/19 1651 08/31/19 1852 08/31/19 2000   BP: 137/67  (!) 173/96 150/85   Pulse: 82 86  85   Resp: 18 18  18    Temp: 98.4 F (36.9 C) 98.4 F (36.9 C)  98.1 F (36.7 C)   TempSrc: Oral Oral  Oral   SpO2: 97% 98%     Weight:       Height:           Patient Lines/Drains/Airways Status    Active Lines, Drains and Airways     Name:   Placement date:   Placement time:   Site:   Days:    Peripheral IV 08/29/19 18 G Right Antecubital   08/29/19    1216    Antecubital   2

## 2019-09-01 NOTE — Progress Notes (Addendum)
Patient is alert and oriented x4, anxious to go home.  VSS, denies pain, sob or discomfort.  Denies headache, blurred vision, numbness or tingling or weakness. NIHSS=0.  NSR on tele.  Lung sounds clear to auscultation on RA.  Tolerates cardiac diet. Denies n/v.  Continent for bowel.  Continent for bladder, voids without difficulty in bathroom.  Skin dry, clean and intact.  Low fall risk, ambulates independently, patient wears walking boot on right foot.  Will continue to monitor.       Plan:  Discharge home today with husband today after rounding with attending MD.

## 2019-09-01 NOTE — Progress Notes (Signed)
PROGRESS NOTE    Date Time: 09/01/19 11:57 AM  Patient Name: Rachel Diaz,Rachel Diaz      Subjective:     Patient is feeling better  Her memory seems improve today per her report  ECHO done, looks stable  No reported speech disturbances    Medications:     Current Facility-Administered Medications   Medication Dose Route Frequency    amLODIPine  10 mg Oral Daily    aspirin  81 mg Oral Daily    atorvastatin  40 mg Oral QHS    clopidogrel  75 mg Oral Daily    enoxaparin  40 mg Subcutaneous Daily    lisinopril  20 mg Oral Daily       Review of Systems:   A comprehensive review of systems was:    No headache, no dizziness, no swallowing difficulties, no speech problems.  Recently, she has been depressed due to family issues due to recently she  lost several family members secondary to medical conditions. No swallowing  difficulties. She is currently wearing special boot for the right foot  problems. No chest pain, no shortness of breath, no abdominal symptoms.  Short term memory problems from admission.    Physical Exam:     Vitals:    09/01/19 0856   BP: 112/74   Pulse:    Resp: 18   Temp: 98.3 F (36.8 C)   SpO2: 99%       Intake and Output Summary (Last 24 hours) at Date Time  No intake or output data in the 24 hours ending 09/01/19 1157    Physical Exam   GENERAL: This is an elderly female, not in distress, looks comfortable.   NEUROLOGIC: Alert, awake, oriented x3. Pupils 3 mm, equally reactive to  light, no nystagmus is noted. Extraocular movements intact. Peripheral  field of vision is normal. Speech is clear and fluent. Cranial nerve  examination II through XII intact. Facial strength and sensation is  intact. Tongue, jaw, palate move symmetrically. Motor examination: Tone  is normal. Power is 5/5 in all 4 extremities except for right foot  strength is not tested. Gait is not tested. Sensory examination is within  normal limits. Coordination: Finger-to-nose testing is normal. Reflexes  are  depressed.  CARDIOVASCULAR: S1, S2 normal intensity.  LUNGS: Clear to auscultation.  ABDOMEN: Soft, benign.    Labs:     Recent CBC No results for input(s): RBC, HGB, HCT, MCV, MCH, MCHC, RDW, MPV, LABPLAT in the last 24 hours.    Invalid input(s): WHITEBLOODCE,  NRBCA,  REFLX,  ANRBA  Recent BMP   Recent Labs     09/01/19  0259   Glucose 127*   BUN 20.0*   Creatinine 1.1*   Calcium 9.6   Sodium 137   Potassium 4.3   Chloride 102   CO2 22       Rads:   Radiological Procedure reviewed.    Assessment:     Patient Active Problem List    Diagnosis Date Noted    Acute CVA (cerebrovascular accident) 08/31/2019    Acute cerebrovascular accident (CVA) 08/31/2019    Intracranial vascular stenosis 08/29/2019    Shortness of breath 04/24/2014    Dyslipidemia 04/24/2014    Family history of premature CAD 04/24/2014    SOB (shortness of breath) 03/07/2012     1. Left hippocampus stroke  2. Focal high grade stenosis  3. Memory disturbances, resolved  4. HLD  5. Asthma    Plan:  1. Will continue asa and plavix  2. ECHO looks stable  3. She needs to follow up with neurologist  4. She agreed with plan  5. Will sign off    Thank you  Discussed with Patient/Family/Staff/Consultants  Signed by: Georganna Skeans, MD

## 2019-09-01 NOTE — Discharge Summary (Signed)
[facility] Discharge Summary      Patient: Rachel Diaz  Admission Date: 08/29/2019   DOB: 1951/01/02  Discharge Date: 09/01/2019    MRN: [id number]  Discharge Attending: Kari Baars MD   Referring Physician: Sheran Luz, MD  PCP: Sheran Luz, MD       DISCHARGE SUMMARY     Discharge Information   Admission Diagnosis:   CVA     Discharge Diagnosis:   Patient Active Problem List    Diagnosis Date Noted    Acute CVA (cerebrovascular accident) 08/31/2019    Acute cerebrovascular accident (CVA) 08/31/2019    Intracranial vascular stenosis 08/29/2019    Shortness of breath 04/24/2014    Dyslipidemia 04/24/2014    Family history of premature CAD 04/24/2014    SOB (shortness of breath) 03/07/2012        Discharge Medications:     Medication List      START taking these medications    amLODIPine 10 MG tablet  Commonly known as: NORVASC  Take 1 tablet (10 mg total) by mouth daily  Start taking on: September 02, 2019     aspirin 81 MG chewable tablet  Chew 1 tablet (81 mg total) by mouth daily  Start taking on: September 02, 2019     atorvastatin 40 MG tablet  Commonly known as: LIPITOR  Take 1 tablet (40 mg total) by mouth nightly     clopidogrel 75 mg tablet  Commonly known as: PLAVIX  Take 1 tablet (75 mg total) by mouth daily  Start taking on: September 02, 2019     lisinopril 20 MG tablet  Commonly known as: ZESTRIL  Take 1 tablet (20 mg total) by mouth daily  Start taking on: September 02, 2019        CONTINUE taking these medications    amphetamine-dextroamphetamine 10 MG tablet  Commonly known as: ADDERALL     buPROPion XL 300 MG 24 hr tablet  Commonly known as: WELLBUTRIN XL     DULoxetine 60 MG capsule  Commonly known as: CYMBALTA           Where to Get Your Medications      These medications were sent to [facility] [address] [place] [address] [facility]  [address] [place], [place] [address]    Phone: [phone]    amLODIPine 10 MG  tablet   aspirin 81 MG chewable tablet   atorvastatin 40 MG tablet   clopidogrel 75 mg tablet   lisinopril 20 MG tablet             Hospital Course   Presentation History   Per admitting H&P " Rachel Diaz is a 69 y.o. female who presents to the hospital with acute right-sided numbness starting when she woke up this morning.    Patient with history of ADHD and daily Adderall, recent depression from death of multiple family members presented to [facility] with acute onset of right-sided numbness when she woke up this morning.  She felt her right forearm and face were feeling numb as well as proximal right thigh.  She was able to move and ambulate normally, denies any speech difficulties.  Denies any neck pain or stiffness or jaw pain.  Denies any chest pain, headache or shortness of breath.  Since her symptoms persisted she went to the emergency room where she was transferred over here for further management.  Head CT  in the ER was negative.    Symptoms have resolved now    Patient reports recently she was started on an antidepressant about 2 3 months ago when she had several deaths in the family "     See HPI for details.    Hospital Course   Following admission patient was seen by neurology consult, neurology team recommended placing on aspirin and statins, then had MRI of the brain as well as MRA of head and neck done, MRI of the brain showed left hippocampal acute infarct, otherwise no major normality noted, MRA of the brain showed left P2 segment of left PCA focal high-grade stenosis which correlates to the stroke.     Following that patient was switched to aspirin 81 mg, Plavix 75 mg was added and lipid medication dose was increased, patient got seen by physical therapy and compression therapy team who recommended home with no needs, 2D echocardiogram was done which showed no cardiac source of stroke, EF was 65%.  Patient was noted to have hypertension and blood pressure medications for added to  optimize blood pressure.     At this time patient is advised to follow-up as an outpatient with neurology team and PCP, patient is back to baseline, cleared for discharge home today.     Procedures:   Echocardiogram Adult Comp W Clr/Dop/Bubble   Final Result      MRI Angiogram Neck WO Contrast   Final Result      1. There is no stenosis of the right common carotid artery bifurcation   and proximal right internal carotid artery carotid artery  based on   NASCET criteria.     2. There is no stenosis of the left common carotid artery bifurcation   and proximal left internal carotid artery carotid artery  based on   NASCET criteria.   3. The neck vertebral arteries are patent.      Trilby Drummer, MD    08/30/2019 10:25 PM      MRI Angiogram Head WO Contrast   Final Result       1. The anterior intracranial circulation is unremarkable.   2. Focal high-grade stenosis in the P2 segment of the left PCA. Portions   of the left P3 segment appear occluded.      Trilby Drummer, MD    08/30/2019 11:01 PM      MRI Brain WO Contrast   Final Result        1. Diffusion restriction within the left hippocampus, likely an acute   infarct.   2. Otherwise, no change from prior study.   3. Minor chronic ischemic changes are present.      Trilby Drummer, MD    08/30/2019 11:01 PM      CT Head WO Contrast   Final Result    No acute intracranial abnormality.      Georgann Housekeeper, MD    08/29/2019 12:38 PM          Consultants  Treatment Team: Attending Provider: Viviann Spare, MD; Consulting Physician: Georganna Skeans, MD; Licensed Practical Nurse: Helyn App, LPN; Registered Nurse: Wallace Going, RN      Best Practices   Was the patient admitted with either a CHF Exacerbation or Pneumonia? No     Progress Note/Physical Exam at Discharge     Subjective: no complaint     Vitals:    08/31/19 1852 08/31/19 2000 09/01/19 0643 09/01/19 0856   BP: (!) 173/96 150/85 148/78 112/74  Pulse:  85     Resp:  18  18   Temp:  98.1 F (36.7 C)  98.3 F (36.8  C)   TempSrc:  Oral  Oral   SpO2:    99%   Weight:       Height:           General: NAD, AAOx3  HEENT: perrla, eomi, sclera anicteric, OP: Clear, MMM  Neck: supple, FROM, no LAD  Cardiovascular: RRR, no m/r/g  Lungs: CTAB, no w/r/r  Abdomen: soft, +BS, NT/ND, no masses, no g/r  Extremities: no C/C/E  Neuro: no weakness on CN, no weakness on extremities  Skin: no rashes or lesions noted    Admission Condition: stable  Discharge Condition: good  Functional Status: Patient is independent with mobility/ambulation, transfers, ADL's, IADL's.     Diagnostics     Labs/Studies Pending at Discharge: No    Last Labs   Recent Labs   Lab 08/30/19  0231 08/29/19  1221   WBC 6.13 7.47   RBC 3.96 4.15   Hgb 12.6 13.4   Hematocrit 38.1 39.9   MCV 96.2* 96.1*   Platelets 296 307       Recent Labs   Lab 09/01/19  0259 08/31/19  0251 08/30/19  0231 08/29/19  1221   Sodium 137 138 136 142   Potassium 4.3 4.1 4.0 4.4   Chloride 102 103 101 104   CO2 22 22 20* 26   BUN 20.0* 16.0 16.0 15.8   Creatinine 1.1* 1.1* 1.2* 1.2*   Glucose 127* 122* 110* 117*   Calcium 9.6 9.5 9.3 9.5       Microbiology Results     None           Patient Instructions   Discharge Diet: cardiac diet  Discharge Activity:  activity as tolerated  Discharge instructions:   Discharge Code Status: Full code   Additional instructions -   Disposition:  Home  Follow Up Appointment:  See Sheran Luz, MD in 1-2 weeks.  See Dr. Kristopher Oppenheim, neurology, in 3-4 weeks.     Attempted to contact PCP to update but unable to reach         The review of the patient's medications does not in any way constitute an endorsement, by this clinician, of their use, dosage, indications, route, efficacy, interactions, or other clinical parameters.    This note was generated within the EP[employer] EMR using Dragon medical speech recognition software and may contain inherent errors or omissions not intended by the user. Grammatical and punctuation errors, random word insertions, deletions, pronoun  errors and incomplete sentences are occasional consequences of this technology due to software limitations. Not all errors are caught or corrected.  Although every attempt is made to root out erroneus and incomplete transcription, the note may still not fully represent the intent or opinion of the author. If there are questions or concerns about the content of this note or information contained within the body of this dictation they should be addressed directly with the author for clarification.    Time spent examining patient, discussing with patient/family regarding hospital course face to face, chart review, reconciling medications and discharge planning:  total time : 30 minutes.    Carmelina Noun, MD  12:06 PM 09/01/2019     CC to: Sheran Luz, MD

## 2019-09-01 NOTE — Progress Notes (Signed)
ADULT OBSERVATION UNIT  DISCHARGE NOTE      Date Time: 09/01/19 12:59 PM  Patient Name: Rachel Diaz  Attending Physician: Viviann Spare, MD      Date of Admission:   08/29/2019        Discharge Instructions:        Patient stable for discharge. Discharge instructions given, patient teachings done and verbalized understanding. All questions answered at this time. Aware to follow-up and schedule appointments as necessary. IV access and cardiac monitor discontinued. Discharge to home   Wheelchair  transport requested.    Patient aware to follow-up with MD and PMD as needed.    Signed by: Gerre Scull

## 2019-09-02 ENCOUNTER — Encounter (INDEPENDENT_AMBULATORY_CARE_PROVIDER_SITE_OTHER): Payer: Self-pay

## 2019-09-03 ENCOUNTER — Telehealth: Payer: Self-pay

## 2019-09-03 ENCOUNTER — Ambulatory Visit (INDEPENDENT_AMBULATORY_CARE_PROVIDER_SITE_OTHER): Payer: Medicare Other

## 2019-09-03 DIAGNOSIS — Z23 Encounter for immunization: Secondary | ICD-10-CM

## 2019-09-03 NOTE — Telephone Encounter (Signed)
7 day discharge phone call attempted. No answer.

## 2019-09-05 NOTE — Progress Notes (Signed)
Per RN notes, patient was discharged home with no services. Patient was not seen by Case Management on day of discharge.        09/05/19 1309   Discharge Disposition   Patient preference/choice provided? N/A   Physical Discharge Disposition Home   Mode of Transportation Car   Patient/Family/POA notified of transfer plan Patient informed only   Patient agreeable to discharge plan/expected d/c date? Yes   Bedside nurse notified of transport plan? Yes   CM Interventions   Follow up appointment scheduled? No   Reason no follow up scheduled? Family to schedule   Referral made for home health RN visit? Does not meet home bound criteria   Multidisciplinary rounds/family meeting before d/c? Yes   Medicare Checklist   Is this a Medicare patient? Yes   Patient received 1st IMM Letter? Yes     Anastasia Pall, BSN, RN, ACM  NT 6 Case Manager  [facility]  [phone]

## 2019-09-12 ENCOUNTER — Encounter (INDEPENDENT_AMBULATORY_CARE_PROVIDER_SITE_OTHER): Payer: Self-pay

## 2019-09-18 ENCOUNTER — Ambulatory Visit (INDEPENDENT_AMBULATORY_CARE_PROVIDER_SITE_OTHER): Payer: Medicare Other | Admitting: Cardiology

## 2019-09-22 ENCOUNTER — Ambulatory Visit (INDEPENDENT_AMBULATORY_CARE_PROVIDER_SITE_OTHER): Payer: Medicare Other

## 2019-09-22 DIAGNOSIS — Z23 Encounter for immunization: Secondary | ICD-10-CM

## 2019-09-24 ENCOUNTER — Encounter (INDEPENDENT_AMBULATORY_CARE_PROVIDER_SITE_OTHER): Payer: Self-pay

## 2019-11-05 ENCOUNTER — Other Ambulatory Visit: Payer: Self-pay | Admitting: Family Medicine

## 2019-11-05 DIAGNOSIS — R7989 Other specified abnormal findings of blood chemistry: Secondary | ICD-10-CM

## 2019-11-06 ENCOUNTER — Ambulatory Visit
Admission: RE | Admit: 2019-11-06 | Discharge: 2019-11-06 | Disposition: A | Discharge status: Home / Self Care | Payer: Medicare Other | Source: Ambulatory Visit | Attending: Family Medicine | Admitting: Family Medicine

## 2019-11-06 DIAGNOSIS — R7989 Other specified abnormal findings of blood chemistry: Secondary | ICD-10-CM

## 2019-12-26 ENCOUNTER — Ambulatory Visit (INDEPENDENT_AMBULATORY_CARE_PROVIDER_SITE_OTHER): Payer: Medicare Other | Admitting: Neurology

## 2019-12-26 ENCOUNTER — Encounter: Payer: Self-pay | Admitting: Neurology

## 2019-12-26 ENCOUNTER — Other Ambulatory Visit: Payer: Self-pay

## 2019-12-26 VITALS — BP 136/81 | HR 86 | Ht 62.0 in | Wt 203.0 lb

## 2019-12-26 DIAGNOSIS — I63432 Cerebral infarction due to embolism of left posterior cerebral artery: Secondary | ICD-10-CM | POA: Diagnosis not present

## 2019-12-26 MED ORDER — ATORVASTATIN CALCIUM 40 MG PO TABS: 60.0000 mg | ORAL_TABLET | Freq: Every day | ORAL | 3 refills | Status: DC

## 2019-12-26 NOTE — Progress Notes (Signed)
[facility] [address] [place]. [place] [address] [phone]       OFFICE CONSULT NOTE  Ms. Marie Walker Date of Birth:  [birth year] Medical Record Number:  [id number]   Referring MD: Aliene Beams  Reason for Referral: Stroke  HPI: Marie Walker is a pleasant 69 year old Caucasian lady is seen today for initial office consultation visit for stroke.  She is accompanied by her husband.  History is obtained from them as well as review of referral notes.  No actual imaging films available in PACS for review today.  She has past medical history of hypertension hyperlipidemia obesity and gout who developed sudden onset of right upper extremity paresthesias from elbow down as well as some gait ataxia and stumbling on 09/11/2019.  She was admitted to [facility] and [place].  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  MR angiogram of the neck was unremarkable hemoglobin A1c was normal at 5.3 and LDL was elevated at 161 mg percent.  Echocardiogram showed normal ejection fraction of 65%.  She was started on aspirin Plavix and a statin.  She is out of follow-up lipid profile checked by primary physician in 11/04/2019 which had improved but was still elevated 84 mg percent.  Her dose of Lipitor was however not increased she is on aspirin Plavix and tolerating fairly well but does complain of increased bruising.  She underwent 2-week Holter monitor placed by outpatient cardiologist at [facility] which showed no evidence of significant cardiac arrhythmias.  She denies any history of syncope palpitations.  She states numbness and gait ataxia completely resolved within a day after admission.  She has had no  recurrent stroke or TIA symptoms.  She states she is doing well and has no complaints today.  She has no prior history of strokes TIAs seizures or significant neurological problems.  No family history of strokes.  ROS:   14 system review of systems is positive for mild memory difficulties only and all other systems negative  PMH:  Past Medical History:  Diagnosis Date  . ADD (attention deficit disorder)   . Asthma   . Dermatitis   . Hyperlipidemia   . Hypertension   . Stroke [facility])     Social History:  Social History   Socioeconomic History  . Marital status: Married    Spouse name: Not on file  . Number of children: Not on file  . Years of education: Not on file  . Highest education level: Not on file  Occupational History  . Not on file  Tobacco Use  . Smoking status: Never Smoker  . Smokeless tobacco: Never Used  Substance and Sexual Activity  . Alcohol use: Yes    Alcohol/week: 2.0 standard drinks    Types: 1 Glasses of wine, 1 Shots of liquor per week    Comment: ocassionally  . Drug use: Not Currently  . Sexual activity: Not on file  Other Topics Concern  . Not on file  Social History Narrative  . Not on file   Social Determinants of Health   Financial Resource Strain:   . Difficulty of Paying Living Expenses:   Food Insecurity:   . Worried About Programme researcher, broadcasting/film/video  in the Last Year:   . Ran Out of Food in the Last Year:   Transportation Needs:   . Freight forwarder (Medical):   Marland Kitchen Lack of Transportation (Non-Medical):   Physical Activity:   . Days of Exercise per Week:   . Minutes of Exercise per Session:   Stress:   . Feeling of Stress :   Social Connections:   . Frequency of Communication with Friends and Family:   . Frequency of Social Gatherings with Friends and Family:   . Attends Religious Services:   . Active Member of Clubs or Organizations:   . Attends Banker Meetings:   Marland Kitchen Marital Status:   Intimate Partner Violence:     . Fear of Current or Ex-Partner:   . Emotionally Abused:   Marland Kitchen Physically Abused:   . Sexually Abused:     Medications:   Current Outpatient Medications on File Prior to Visit  Medication Sig Dispense Refill  . albuterol (VENTOLIN HFA) 108 (90 Base) MCG/ACT inhaler every 4 (four) hours as needed.     Marland Kitchen amLODipine (NORVASC) 10 MG tablet amlodipine 10 mg tablet    . aspirin 81 MG chewable tablet Chew by mouth.    . B COMPLEX VITAMINS ER PO Take by mouth.    Marland Kitchen BIOTIN PO Take by mouth.    . Cholecalciferol (D3 PO) Take 2,000 Units by mouth.    . colchicine 0.6 MG tablet Take 0.6 mg by mouth as needed.     . Cyanocobalamin (B-12 SL) Place 5,000 mcg under the tongue.    . cyclobenzaprine (FLEXERIL) 10 MG tablet as needed.     . DULoxetine (CYMBALTA) 60 MG capsule Take 90 mg by mouth.     . hydrOXYzine (ATARAX/VISTARIL) 25 MG tablet Take 25 mg by mouth as needed.     . montelukast (SINGULAIR) 10 MG tablet Take by mouth at bedtime.     . Multiple Vitamin (CALCIUM COMPLEX PO) Take by mouth.    . Omega-3 Fatty Acids (FISH OIL) 1000 MG CAPS Take 1 capsule by mouth daily.    . pantoprazole (PROTONIX) 40 MG tablet Take 40 mg by mouth daily.     No current facility-administered medications on file prior to visit.    Allergies:   Allergies  Allergen Reactions  . Morphine Other (See Comments)    Adverse reaction  . Orphenadrine Other (See Comments)    Makes BP drop   . Oxycodone-Acetaminophen Other (See Comments)  . Other     Physical Exam General: Obese elderly Caucasian lady seated, in no evident distress Head: head normocephalic and atraumatic.   Neck: supple with no carotid or supraclavicular bruits Cardiovascular: regular rate and rhythm, no murmurs Musculoskeletal: no deformity Skin:  no rash/petichiae Vascular:  Normal pulses all extremities  Neurologic Exam Mental Status: Awake and fully alert. Oriented to place and time. Recent and remote memory intact. Attention span,  concentration and fund of knowledge appropriate. Mood and affect appropriate.  Diminished recall 2/3.  Able to name 13 animals which can walk on 4 legs. Cranial Nerves: Fundoscopic exam reveals sharp disc margins. Pupils equal, briskly reactive to light. Extraocular movements full without nystagmus. Visual fields full to confrontation. Hearing intact. Facial sensation intact. Face, tongue, palate moves normally and symmetrically.  Motor: Normal bulk and tone. Normal strength in all tested extremity muscles. Sensory.: intact to touch , pinprick , position and vibratory sensation.  Coordination: Rapid alternating movements normal in all extremities. Finger-to-nose and  heel-to-shin performed accurately bilaterally. Gait and Station: Arises from chair without difficulty. Stance is normal. Gait demonstrates normal stride length and balance . Able to heel, toe and tandem walk with great difficulty.  Reflexes: 1+ and symmetric. Toes downgoing.   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  She is doing clinically clear quite well without any residual deficits.  Vascular risk factors of hypertension, hyperlipidemia, intracranial stenosis and obesity     PLAN: I had a long d/w patient and her husband about her recent stroke, risk for recurrent stroke/TIAs, personally independently reviewed imaging studies and stroke evaluation results and answered questions.Continue aspirin 81 mg daily  for secondary stroke prevention but stopped Plavix since it has been more than 3 months since her stroke and I see no added benefit of long-term dual antiplatelet therapy and she is having bruising also and maintain strict control of hypertension with blood pressure goal below 130/90, diabetes with hemoglobin A1c goal below 6.5% and lipids with LDL cholesterol goal  below 70 mg/dL. I also advised the patient to eat a healthy diet with plenty of whole grains, cereals, fruits and vegetables, exercise regularly and maintain ideal body weight I recommend she increase the dose of Lipitor to 60 mg daily as her last LDL cholesterol on 11/04/2019 was yet slightly elevated at 84.  Greater than 50% time during this 50-minute consultation visit were spent in counseling and coordination of care about her stroke and discussion about prevention and treatment and answering questions.  Followup in the future with my nurse practitioner Shanda Bumps in 3 months or call earlier if necessary. Delia Heady, MD  [facility] [address] [place], [place] [address]  Phone [phone] Fax [phone] Note: This document was prepared with digital dictation and possible smart phrase technology. Any transcriptional errors that result from this process are unintentional.

## 2019-12-26 NOTE — Patient Instructions (Signed)
I had a long d/w patient and her husband about her recent stroke, risk for recurrent stroke/TIAs, personally independently reviewed imaging studies and stroke evaluation results and answered questions.Continue aspirin 81 mg daily  for secondary stroke prevention but stopped Plavix since it has been more than 3 months since her stroke and I see no added benefit of long-term dual antiplatelet therapy and she is having bruising also and maintain strict control of hypertension with blood pressure goal below 130/90, diabetes with hemoglobin A1c goal below 6.5% and lipids with LDL cholesterol goal below 70 mg/dL. I also advised the patient to eat a healthy diet with plenty of whole grains, cereals, fruits and vegetables, exercise regularly and maintain ideal body weight I recommend she increase the dose of Lipitor to 60 mg daily as her last LDL cholesterol on 11/04/2019 was yet slightly elevated at 84.  Followup in the future with my nurse practitioner Shanda Bumps in 3 months or call earlier if necessary.  Stroke Prevention Some medical conditions and behaviors are associated with a higher chance of having a stroke. You can help prevent a stroke by making nutrition, lifestyle, and other changes, including managing any medical conditions you may have. What nutrition changes can be made?   Eat healthy foods. You can do this by: ? Choosing foods high in fiber, such as fresh fruits and vegetables and whole grains. ? Eating at least 5 or more servings of fruits and vegetables a day. Try to fill half of your plate at each meal with fruits and vegetables. ? Choosing lean protein foods, such as lean cuts of meat, poultry without skin, fish, tofu, beans, and nuts. ? Eating low-fat dairy products. ? Avoiding foods that are high in salt (sodium). This can help lower blood pressure. ? Avoiding foods that have saturated fat, trans fat, and cholesterol. This can help prevent high cholesterol. ? Avoiding processed and premade  foods.  Follow your health care provider's specific guidelines for losing weight, controlling high blood pressure (hypertension), lowering high cholesterol, and managing diabetes. These may include: ? Reducing your daily calorie intake. ? Limiting your daily sodium intake to 1,500 milligrams (mg). ? Using only healthy fats for cooking, such as olive oil, canola oil, or sunflower oil. ? Counting your daily carbohydrate intake. What lifestyle changes can be made?  Maintain a healthy weight. Talk to your health care provider about your ideal weight.  Get at least 30 minutes of moderate physical activity at least 5 days a week. Moderate activity includes brisk walking, biking, and swimming.  Do not use any products that contain nicotine or tobacco, such as cigarettes and e-cigarettes. If you need help quitting, ask your health care provider. It may also be helpful to avoid exposure to secondhand smoke.  Limit alcohol intake to no more than 1 drink a day for nonpregnant women and 2 drinks a day for men. One drink equals 12 oz of beer, 5 oz of wine, or 1 oz of hard liquor.  Stop any illegal drug use.  Avoid taking birth control pills. Talk to your health care provider about the risks of taking birth control pills if: ? You are over 66 years old. ? You smoke. ? You get migraines. ? You have ever had a blood clot. What other changes can be made?  Manage your cholesterol levels. ? Eating a healthy diet is important for preventing high cholesterol. If cholesterol cannot be managed through diet alone, you may also need to take medicines. ? Take any  prescribed medicines to control your cholesterol as told by your health care provider.  Manage your diabetes. ? Eating a healthy diet and exercising regularly are important parts of managing your blood sugar. If your blood sugar cannot be managed through diet and exercise, you may need to take medicines. ? Take any prescribed medicines to control  your diabetes as told by your health care provider.  Control your hypertension. ? To reduce your risk of stroke, try to keep your blood pressure below 130/80. ? Eating a healthy diet and exercising regularly are an important part of controlling your blood pressure. If your blood pressure cannot be managed through diet and exercise, you may need to take medicines. ? Take any prescribed medicines to control hypertension as told by your health care provider. ? Ask your health care provider if you should monitor your blood pressure at home. ? Have your blood pressure checked every year, even if your blood pressure is normal. Blood pressure increases with age and some medical conditions.  Get evaluated for sleep disorders (sleep apnea). Talk to your health care provider about getting a sleep evaluation if you snore a lot or have excessive sleepiness.  Take over-the-counter and prescription medicines only as told by your health care provider. Aspirin or blood thinners (antiplatelets or anticoagulants) may be recommended to reduce your risk of forming blood clots that can lead to stroke.  Make sure that any other medical conditions you have, such as atrial fibrillation or atherosclerosis, are managed. What are the warning signs of a stroke? The warning signs of a stroke can be easily remembered as BEFAST.  B is for balance. Signs include: ? Dizziness. ? Loss of balance or coordination. ? Sudden trouble walking.  E is for eyes. Signs include: ? A sudden change in vision. ? Trouble seeing.  F is for face. Signs include: ? Sudden weakness or numbness of the face. ? The face or eyelid drooping to one side.  A is for arms. Signs include: ? Sudden weakness or numbness of the arm, usually on one side of the body.  S is for speech. Signs include: ? Trouble speaking (aphasia). ? Trouble understanding.  T is for time. ? These symptoms may represent a serious problem that is an emergency. Do not  wait to see if the symptoms will go away. Get medical help right away. Call your local emergency services (911 in the [place] Do not drive yourself to the hospital.  Other signs of stroke may include: ? A sudden, severe headache with no known cause. ? Nausea or vomiting. ? Seizure. Where to find more information For more information, visit:  American Stroke Association: www.strokeassociation.org  National Stroke Association: www.stroke.org Summary  You can prevent a stroke by eating healthy, exercising, not smoking, limiting alcohol intake, and managing any medical conditions you may have.  Do not use any products that contain nicotine or tobacco, such as cigarettes and e-cigarettes. If you need help quitting, ask your health care provider. It may also be helpful to avoid exposure to secondhand smoke.  Remember BEFAST for warning signs of stroke. Get help right away if you or a loved one has any of these signs. This information is not intended to replace advice given to you by your health care provider. Make sure you discuss any questions you have with your health care provider. Document Revised: 05/26/2017 Document Reviewed: 07/19/2016 Elsevier Patient Education  2020 [employer].

## 2020-04-13 ENCOUNTER — Ambulatory Visit: Payer: Medicare Other | Admitting: Adult Health

## 2020-04-21 ENCOUNTER — Ambulatory Visit (INDEPENDENT_AMBULATORY_CARE_PROVIDER_SITE_OTHER): Payer: Medicare Other | Admitting: Adult Health

## 2020-04-21 ENCOUNTER — Encounter: Payer: Self-pay | Admitting: Adult Health

## 2020-04-21 ENCOUNTER — Other Ambulatory Visit: Payer: Self-pay

## 2020-04-21 VITALS — BP 130/72 | HR 67 | Wt 193.0 lb

## 2020-04-21 DIAGNOSIS — E785 Hyperlipidemia, unspecified: Secondary | ICD-10-CM

## 2020-04-21 DIAGNOSIS — R413 Other amnesia: Secondary | ICD-10-CM | POA: Diagnosis not present

## 2020-04-21 DIAGNOSIS — I10 Essential (primary) hypertension: Secondary | ICD-10-CM

## 2020-04-21 DIAGNOSIS — I63432 Cerebral infarction due to embolism of left posterior cerebral artery: Secondary | ICD-10-CM

## 2020-04-21 NOTE — Patient Instructions (Addendum)
Continue aspirin 81 mg daily  and atorvastatin 60 mg daily for secondary stroke prevention  Continue to follow up with PCP regarding cholesterol and blood pressure management  Maintain strict control of hypertension with blood pressure goal below 130/90 and cholesterol with LDL cholesterol (bad cholesterol) goal below 70 mg/dL.     Followup in the future with me in 6 months or call earlier if needed     Thank you for coming to see [place] at [facility]. I hope we have been able to provide you high quality care today.  You may receive a patient satisfaction survey over the next few weeks. We would appreciate your feedback and comments so that we may continue to improve ourselves and the health of our patients.

## 2020-04-21 NOTE — Progress Notes (Signed)
[facility] [address] [place] [address] [phone]       STROKE FOLLOW UP NOTE  Ms. Gwynneth Aliment Date of Birth:  [birth year] Medical Record Number:  [id number]   Referring MD: Aliene Beams  Reason for Referral: Stroke    Chief Complaint  Patient presents with  . Follow-up    rm 9  . Cerebrovascular Accident     HPI:   Today, 04/21/2020, Ms. Sowle returns for stroke follow-up accompanied by her husband. Stable since prior visit without residual deficits and denies new or reoccurring stroke/TIA symptoms. She does report some short term memory loss which has been present since her stroke but also endorses multiple other stressors that may be contributing.  She will occasionally forget recent conversations, repeat a question or forget what she wants on television.  She is able to maintain ADLs and IADLs independently.  Short-term memory concerns have been stable without worsening and does do daily memory exercises such as crossword puzzles, sudoku and exercises on Lumosity.  Remains on aspirin and atorvastatin 60 mg daily for secondary stroke prevention. She did have cholesterol levels checked by PCP around July or August and plans on repeating in December at yearly physical. Blood pressure today 130/82.  She does not routinely monitor at home.  No further concerns at this time.   History provided for reference purposes only Initial visit 12/26/2019 Dr. Pearlean Brownie: Ms. Tremblay is a pleasant 69 year old Caucasian lady is seen today for initial office consultation visit for stroke.  She is accompanied by her husband.  History is obtained from them as well as review of referral notes.  No actual imaging films available in PACS for review today.  She has past medical history of hypertension hyperlipidemia obesity and gout who developed sudden onset of right upper extremity paresthesias from elbow down as well as some gait ataxia and stumbling on 09/11/2019.  She was admitted to  [facility] and [place].  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  MR angiogram of the neck was unremarkable hemoglobin A1c was normal at 5.3 and LDL was elevated at 161 mg percent.  Echocardiogram showed normal ejection fraction of 65%.  She was started on aspirin Plavix and a statin.  She is out of follow-up lipid profile checked by primary physician in 11/04/2019 which had improved but was still elevated 84 mg percent.  Her dose of Lipitor was however not increased she is on aspirin Plavix and tolerating fairly well but does complain of increased bruising.  She underwent 2-week Holter monitor placed by outpatient cardiologist at [facility] which showed no evidence of significant cardiac arrhythmias.  She denies any history of syncope palpitations.  She states numbness and gait ataxia completely resolved within a day after admission.  She has had no recurrent stroke or TIA symptoms.  She states she is doing well and has no complaints today.  She has no prior history of strokes TIAs seizures or significant neurological problems.  No family history of strokes.   ROS:   14 system review of systems is positive for mild memory difficulties only and all other systems negative  PMH:  Past Medical History:  Diagnosis Date  . ADD (  attention deficit disorder)   . Asthma   . Dermatitis   . Hyperlipidemia   . Hypertension   . Stroke [facility])     Social History:  Social History   Socioeconomic History  . Marital status: Married    Spouse name: Not on file  . Number of children: Not on file  . Years of education: Not on file  . Highest education level: Not on file  Occupational History  . Not on file   Tobacco Use  . Smoking status: Never Smoker  . Smokeless tobacco: Never Used  Substance and Sexual Activity  . Alcohol use: Yes    Alcohol/week: 2.0 standard drinks    Types: 1 Glasses of wine, 1 Shots of liquor per week    Comment: ocassionally  . Drug use: Not Currently  . Sexual activity: Not on file  Other Topics Concern  . Not on file  Social History Narrative  . Not on file   Social Determinants of Health   Financial Resource Strain:   . Difficulty of Paying Living Expenses: Not on file  Food Insecurity:   . Worried About Programme researcher, broadcasting/film/video in the Last Year: Not on file  . Ran Out of Food in the Last Year: Not on file  Transportation Needs:   . Lack of Transportation (Medical): Not on file  . Lack of Transportation (Non-Medical): Not on file  Physical Activity:   . Days of Exercise per Week: Not on file  . Minutes of Exercise per Session: Not on file  Stress:   . Feeling of Stress : Not on file  Social Connections:   . Frequency of Communication with Friends and Family: Not on file  . Frequency of Social Gatherings with Friends and Family: Not on file  . Attends Religious Services: Not on file  . Active Member of Clubs or Organizations: Not on file  . Attends Banker Meetings: Not on file  . Marital Status: Not on file  Intimate Partner Violence:   . Fear of Current or Ex-Partner: Not on file  . Emotionally Abused: Not on file  . Physically Abused: Not on file  . Sexually Abused: Not on file    Medications:   Current Outpatient Medications on File Prior to Visit  Medication Sig Dispense Refill  . albuterol (VENTOLIN HFA) 108 (90 Base) MCG/ACT inhaler every 4 (four) hours as needed.     Marland Kitchen allopurinol (ZYLOPRIM) 100 MG tablet Take 100 mg by mouth 2 (two) times daily.    Marland Kitchen amLODipine (NORVASC) 10 MG tablet amlodipine 10 mg tablet    . aspirin 81 MG chewable tablet Chew by mouth.    Marland Kitchen atorvastatin (LIPITOR) 40 MG tablet Take 1.5 tablets (60 mg  total) by mouth daily. 60 tablet 3  . B COMPLEX VITAMINS ER PO Take by mouth.    Marland Kitchen BIOTIN PO Take by mouth.    . Cholecalciferol (D3 PO) Take 2,000 Units by mouth.    . colchicine 0.6 MG tablet Take 0.6 mg by mouth as needed.     . Cyanocobalamin (B-12 SL) Place 5,000 mcg under the tongue.    . cyclobenzaprine (FLEXERIL) 10 MG tablet as needed.     . DULoxetine (CYMBALTA) 60 MG capsule Take 90 mg by mouth.     . hydrOXYzine (ATARAX/VISTARIL) 25 MG tablet Take 25 mg by mouth as needed.     . montelukast (SINGULAIR) 10 MG tablet Take by mouth at bedtime.     Marland Kitchen  Multiple Vitamin (CALCIUM COMPLEX PO) Take by mouth.    . Omega-3 Fatty Acids (FISH OIL) 1000 MG CAPS Take 1 capsule by mouth daily.    . pantoprazole (PROTONIX) 40 MG tablet Take 40 mg by mouth daily.     No current facility-administered medications on file prior to visit.    Allergies:   Allergies  Allergen Reactions  . Morphine Other (See Comments)    Adverse reaction  . Orphenadrine Other (See Comments)    Makes BP drop   . Oxycodone-Acetaminophen Other (See Comments)  . Other      Physical Exam Today's Vitals   04/21/20 0919  BP: 130/72  Pulse: 67  Weight: 193 lb (87.5 kg)   Body mass index is 35.3 kg/m.   General: Obese pleasant elderly Caucasian lady seated, in no evident distress Head: head normocephalic and atraumatic.   Neck: supple with no carotid or supraclavicular bruits Cardiovascular: regular rate and rhythm, no murmurs Musculoskeletal: no deformity Skin:  no rash/petichiae Vascular:  Normal pulses all extremities  Neurologic Exam Mental Status: Awake and fully alert.  Fluent speech and language.  Oriented to place and time. Recent memory subjectively impaired and remote memory intact. Attention span, concentration and fund of knowledge appropriate during visit. Mood and affect appropriate.   Cranial Nerves: Pupils equal, briskly reactive to light. Extraocular movements full without nystagmus.  Visual fields full to confrontation. Hearing intact. Facial sensation intact. Face, tongue, palate moves normally and symmetrically.  Motor: Normal bulk and tone. Normal strength in all tested extremity muscles. Sensory.: intact to touch , pinprick , position and vibratory sensation.  Coordination: Rapid alternating movements normal in all extremities. Finger-to-nose and heel-to-shin performed accurately bilaterally. Gait and Station: Arises from chair without difficulty. Stance is normal. Gait demonstrates normal stride length and balance .  Difficulty performing heel, toe and tandem walk Reflexes: 1+ and symmetric. Toes downgoing.     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  She is doing clinically clear quite well without any residual deficits.  Vascular risk factors of hypertension, hyperlipidemia, intracranial stenosis and obesity   -Short-term memory complaints likely multifactorial with history of prior stroke and increased stressors with other medical comorbidities and life stressors.  Discussed continuing memory exercises as well as reducing stress levels and discussed importance of managing stroke risk factors, routine exercise, healthy diet and adequate sleep -Continue aspirin 81 mg daily and atorvastatin 60mg  daily for secondary stroke prevention  -Plans on repeat lipid panel with PCP in December with prior lipid panel in July or August which was satisfactory per patient -Discussed secondary stroke prevention measures and importance of close PCP follow-up to maintain strict control of hypertension with blood pressure goal below 130/90 and lipids with LDL cholesterol goal below 70 mg/dL.    Follow-up in 6 months or call earlier if needed  CC:  [facility] provider: Dr. September, Leonette Most, MD     I spent 30 minutes of face-to-face  and non-face-to-face time with patient and husband.  This included previsit chart review, lab review, study review, order entry, electronic health record documentation, patient education regarding history of prior stroke, short-term memory concerns, importance of managing stroke risk factors and answered all other questions to patient and husband satisfaction   Fleet Contras, [facility]  [facility] [address] [place], [place]  Phone [phone] Fax [phone]  Note: This document was prepared with digital dictation and possible smart phrase technology. Any transcriptional errors that result from this process are unintentional.

## 2020-04-22 NOTE — Progress Notes (Signed)
I agree with the above plan 

## 2020-05-10 ENCOUNTER — Other Ambulatory Visit: Payer: Self-pay

## 2020-05-10 ENCOUNTER — Emergency Department (HOSPITAL_COMMUNITY)
Admission: EM | Admit: 2020-05-10 | Discharge: 2020-05-10 | Disposition: A | Discharge status: Home / Self Care | Payer: Medicare Other | Attending: Emergency Medicine | Admitting: Emergency Medicine

## 2020-05-10 ENCOUNTER — Encounter (HOSPITAL_COMMUNITY): Payer: Self-pay | Admitting: Emergency Medicine

## 2020-05-10 ENCOUNTER — Emergency Department (HOSPITAL_COMMUNITY): Payer: Medicare Other

## 2020-05-10 DIAGNOSIS — I10 Essential (primary) hypertension: Secondary | ICD-10-CM | POA: Diagnosis not present

## 2020-05-10 DIAGNOSIS — J45909 Unspecified asthma, uncomplicated: Secondary | ICD-10-CM | POA: Insufficient documentation

## 2020-05-10 DIAGNOSIS — Z7982 Long term (current) use of aspirin: Secondary | ICD-10-CM | POA: Diagnosis not present

## 2020-05-10 DIAGNOSIS — R42 Dizziness and giddiness: Secondary | ICD-10-CM | POA: Diagnosis not present

## 2020-05-10 DIAGNOSIS — R110 Nausea: Secondary | ICD-10-CM | POA: Diagnosis not present

## 2020-05-10 DIAGNOSIS — Z79899 Other long term (current) drug therapy: Secondary | ICD-10-CM | POA: Diagnosis not present

## 2020-05-10 DIAGNOSIS — Z96653 Presence of artificial knee joint, bilateral: Secondary | ICD-10-CM | POA: Diagnosis not present

## 2020-05-10 LAB — COMPREHENSIVE METABOLIC PANEL
ALT: 62 U/L — ABNORMAL HIGH (ref 0–44)
AST: 53 U/L — ABNORMAL HIGH (ref 15–41)
Albumin: 3.7 g/dL (ref 3.5–5.0)
Alkaline Phosphatase: 125 U/L (ref 38–126)
Anion gap: 9 (ref 5–15)
BUN: 14 mg/dL (ref 8–23)
CO2: 28 mmol/L (ref 22–32)
Calcium: 10 mg/dL (ref 8.9–10.3)
Chloride: 103 mmol/L (ref 98–111)
Creatinine, Ser: 1.01 mg/dL — ABNORMAL HIGH (ref 0.44–1.00)
GFR, Estimated: 60 mL/min — ABNORMAL LOW (ref >60)
Glucose, Bld: 146 mg/dL — ABNORMAL HIGH (ref 70–99)
Potassium: 4.5 mmol/L (ref 3.5–5.1)
Sodium: 140 mmol/L (ref 135–145)
Total Bilirubin: 0.7 mg/dL (ref 0.3–1.2)
Total Protein: 6.8 g/dL (ref 6.5–8.1)

## 2020-05-10 LAB — DIFFERENTIAL
Abs Immature Granulocytes: 0.02 10*3/uL (ref 0.00–0.07)
Basophils Absolute: 0 10*3/uL (ref 0.0–0.1)
Basophils Relative: 1 %
Eosinophils Absolute: 0.4 10*3/uL (ref 0.0–0.5)
Eosinophils Relative: 7 %
Immature Granulocytes: 0 %
Lymphocytes Relative: 17 %
Lymphs Abs: 1 10*3/uL (ref 0.7–4.0)
Monocytes Absolute: 0.6 10*3/uL (ref 0.1–1.0)
Monocytes Relative: 11 %
Neutro Abs: 3.6 10*3/uL (ref 1.7–7.7)
Neutrophils Relative %: 64 %

## 2020-05-10 LAB — CBC
HCT: 38.5 % (ref 36.0–46.0)
Hemoglobin: 12.6 g/dL (ref 12.0–15.0)
MCH: 32.6 pg (ref 26.0–34.0)
MCHC: 32.7 g/dL (ref 30.0–36.0)
MCV: 99.7 fL (ref 80.0–100.0)
Platelets: 300 10*3/uL (ref 150–400)
RBC: 3.86 MIL/uL — ABNORMAL LOW (ref 3.87–5.11)
RDW: 14.8 % (ref 11.5–15.5)
WBC: 5.7 10*3/uL (ref 4.0–10.5)
nRBC: 0 % (ref 0.0–0.2)

## 2020-05-10 LAB — APTT: aPTT: 25 seconds (ref 24–36)

## 2020-05-10 LAB — PROTIME-INR
INR: 1 (ref 0.8–1.2)
Prothrombin Time: 12.7 seconds (ref 11.4–15.2)

## 2020-05-10 MED ORDER — MECLIZINE HCL 12.5 MG PO TABS: 12.5000 mg | ORAL_TABLET | Freq: Three times a day (TID) | ORAL | 0 refills | Status: DC | PRN

## 2020-05-10 MED ORDER — ONDANSETRON 8 MG PO TBDP: 8.0000 mg | ORAL_TABLET | Freq: Three times a day (TID) | ORAL | 0 refills | Status: DC | PRN

## 2020-05-10 MED ORDER — SODIUM CHLORIDE 0.9% FLUSH
3.0000 mL | Freq: Once | INTRAVENOUS | Status: AC
Start: 2020-05-10 — End: 2020-05-10
  Administered 2020-05-10: 3 mL via INTRAVENOUS

## 2020-05-10 MED ORDER — LORAZEPAM 2 MG/ML IJ SOLN
0.5000 mg | Freq: Once | INTRAMUSCULAR | Status: AC
  Administered 2020-05-10: 0.5 mg via INTRAVENOUS
  Filled 2020-05-10: qty 1

## 2020-05-10 NOTE — ED Triage Notes (Signed)
Pt to triage via [facility] from home.  Reports nausea x 1 week.  Reports dizziness that she states woke her up at 5:45am.  Went to bed at 10:30pm.  No arm drift.  VAN negative.  Denies weakness/numbness.  Reports history of stroke with dizziness and nausea in May.

## 2020-05-10 NOTE — ED Notes (Signed)
Patient transported to MRI 

## 2020-05-10 NOTE — Discharge Instructions (Addendum)
Take the medications as prescribed.  Follow-up with your doctor to be rechecked this coming week.  Return as needed for worsening symptoms

## 2020-05-10 NOTE — ED Provider Notes (Signed)
[facility] EMERGENCY DEPARTMENT Provider Note   CSN: [id number] Arrival date & time: 05/10/20  4098     History Chief Complaint  Patient presents with  . Dizziness  . Nausea    Marie Walker is a 69 y.o. female.  HPI   Patient presents to the ED for evaluation of nausea and dizziness.  Patient states she is had some nausea for the past week.  She thought it might have been just a viral illness.  This morning however when she woke up at 545 she felt very dizzy and off-balance.  Last evening she felt fine before she went to bed.  She did not have any weakness or numbness.  Patient does have history of prior stroke/TIA that caused dizziness and she was worried she was having a recurrent stroke.  She does feel like the symptoms have improved.  Her balance is getting better.  Past Medical History:  Diagnosis Date  . ADD (attention deficit disorder)   . Asthma   . Dermatitis   . Hyperlipidemia   . Hypertension   . Stroke [facility])     There are no problems to display for this patient.   Past Surgical History:  Procedure Laterality Date  . BREAST BIOPSY    . REPLACEMENT TOTAL KNEE BILATERAL    . right foot fracture     . TONSILLECTOMY       OB History   No obstetric history on file.     No family history on file.  Social History   Tobacco Use  . Smoking status: Never Smoker  . Smokeless tobacco: Never Used  Substance Use Topics  . Alcohol use: Yes    Alcohol/week: 2.0 standard drinks    Types: 1 Glasses of wine, 1 Shots of liquor per week    Comment: ocassionally  . Drug use: Not Currently    Home Medications Prior to Admission medications   Medication Sig Start Date End Date Taking? Authorizing Provider  albuterol (VENTOLIN HFA) 108 (90 Base) MCG/ACT inhaler every 4 (four) hours as needed.  11/05/19   [provider]  allopurinol (ZYLOPRIM) 100 MG tablet Take 100 mg by mouth 2 (two) times daily.    [provider]  amLODipine  (NORVASC) 10 MG tablet amlodipine 10 mg tablet 09/03/19   [provider]  aspirin 81 MG chewable tablet Chew by mouth. 09/01/19   [provider]  atorvastatin (LIPITOR) 40 MG tablet Take 1.5 tablets (60 mg total) by mouth daily. 12/26/19   Micki Riley, MD  B COMPLEX VITAMINS ER PO Take by mouth.    [provider]  BIOTIN PO Take by mouth.    [provider]  Cholecalciferol (D3 PO) Take 2,000 Units by mouth.    [provider]  colchicine 0.6 MG tablet Take 0.6 mg by mouth as needed.  12/01/19   [provider]  Cyanocobalamin (B-12 SL) Place 5,000 mcg under the tongue.    [provider]  cyclobenzaprine (FLEXERIL) 10 MG tablet as needed.     [provider]  DULoxetine (CYMBALTA) 60 MG capsule Take 90 mg by mouth.     [provider]  hydrOXYzine (ATARAX/VISTARIL) 25 MG tablet Take 25 mg by mouth as needed.  11/05/19   [provider]  meclizine (ANTIVERT) 12.5 MG tablet Take 1 tablet (12.5 mg total) by mouth 3 (three) times daily as needed for dizziness. 05/10/20   Linwood Dibbles, MD  montelukast (SINGULAIR) 10  MG tablet Take by mouth at bedtime.  10/28/19   [provider]  Multiple Vitamin (CALCIUM COMPLEX PO) Take by mouth.    [provider]  Omega-3 Fatty Acids (FISH OIL) 1000 MG CAPS Take 1 capsule by mouth daily.    [provider]  ondansetron (ZOFRAN ODT) 8 MG disintegrating tablet Take 1 tablet (8 mg total) by mouth every 8 (eight) hours as needed for nausea or vomiting. 05/10/20   Linwood Dibbles, MD  pantoprazole (PROTONIX) 40 MG tablet Take 40 mg by mouth daily. 12/19/19   [provider]    Allergies    Morphine, Orphenadrine, Oxycodone-acetaminophen, and Other  Review of Systems   Review of Systems  All other systems reviewed and are negative.   Physical Exam Updated Vital Signs BP 128/81 (BP Location: Left Arm)   Pulse 74   Temp 97.7 F (36.5 C) (Oral)    Resp 17   Ht 1.575 m (5\' 2" )   Wt 85.3 kg   SpO2 95%   BMI 34.39 kg/m   Physical Exam Vitals and nursing note reviewed.  Constitutional:      General: She is not in acute distress.    Appearance: She is well-developed.  HENT:     Head: Normocephalic and atraumatic.     Right Ear: External ear normal.     Left Ear: External ear normal.  Eyes:     General: No scleral icterus.       Right eye: No discharge.        Left eye: No discharge.     Conjunctiva/sclera: Conjunctivae normal.  Neck:     Trachea: No tracheal deviation.  Cardiovascular:     Rate and Rhythm: Normal rate and regular rhythm.  Pulmonary:     Effort: Pulmonary effort is normal. No respiratory distress.     Breath sounds: Normal breath sounds. No stridor. No wheezing or rales.  Abdominal:     General: Bowel sounds are normal. There is no distension.     Palpations: Abdomen is soft.     Tenderness: There is no abdominal tenderness. There is no guarding or rebound.  Musculoskeletal:        General: No tenderness.     Cervical back: Neck supple.  Skin:    General: Skin is warm and dry.     Findings: No rash.  Neurological:     Mental Status: She is alert and oriented to person, place, and time.     Cranial Nerves: No cranial nerve deficit (No facial droop, extraocular movements intact, tongue midline ).     Sensory: No sensory deficit.     Motor: No abnormal muscle tone or seizure activity.     Coordination: Coordination normal.     Comments: No pronator drift bilateral upper extrem, able to hold both legs off bed for 5 seconds, sensation intact in all extremities, no visual field cuts, no left or right sided neglect, normal finger-nose exam bilaterally, no nystagmus noted      ED Results / Procedures / Treatments   Labs (all labs ordered are listed, but only abnormal results are displayed) Labs Reviewed  CBC - Abnormal; Notable for the following components:      Result Value   RBC 3.86 (*)    All  other components within normal limits  COMPREHENSIVE METABOLIC PANEL - Abnormal; Notable for the following components:   Glucose, Bld 146 (*)    Creatinine, Ser 1.01 (*)    AST 53 (*)  ALT 62 (*)    GFR, Estimated 60 (*)    All other components within normal limits  PROTIME-INR  APTT  DIFFERENTIAL    EKG EKG Interpretation  Date/Time:  Sunday May 10 2020 08:03:20 EST Ventricular Rate:  78 PR Interval:  156 QRS Duration: 74 QT Interval:  346 QTC Calculation: 394 R Axis:   36 Text Interpretation: Normal sinus rhythm Nonspecific T wave abnormality Abnormal ECG No old tracing to compare Confirmed by Linwood Dibbles [phone]) on 05/10/2020 8:48:45 AM   Radiology CT HEAD WO CONTRAST  Result Date: 05/10/2020 CLINICAL DATA:  Dizziness and gait disturbance EXAM: CT HEAD WITHOUT CONTRAST TECHNIQUE: Contiguous axial images were obtained from the base of the skull through the vertex without intravenous contrast. COMPARISON:  None. FINDINGS: Brain: Ventricles are normal in size and figure a shin. There is mild frontal atrophy bilaterally as well as mild cerebellar atrophy. There is no appreciable intracranial mass, hemorrhage, extra-axial fluid collection, or midline shift. There is rather minimal small vessel disease in the centra semiovale bilaterally. No acute infarct evident. Vascular: No hyperdense vessels. Calcification noted in each carotid siphon region. Skull: Bony calvarium appears intact. Sinuses/Orbits: Visualized paranasal sinuses are clear. Orbits appear symmetric bilaterally. Other: Mastoid air cells are clear. IMPRESSION: Frontal and cerebellar atrophy. Ventricles normal in size and configuration. Minimal periventricular small vessel disease. No acute infarct. No mass or hemorrhage. Foci of arterial vascular calcification noted. Electronically Signed   By: Bretta Bang III M.D.   On: 05/10/2020 09:09   MR BRAIN WO CONTRAST  Result Date: 05/10/2020 CLINICAL DATA:  Nausea  over the last week. Dizziness of acute onset. EXAM: MRI HEAD WITHOUT CONTRAST TECHNIQUE: Multiplanar, multiecho pulse sequences of the brain and surrounding structures were obtained without intravenous contrast. COMPARISON:  Head CT earlier same day FINDINGS: Brain: Diffusion imaging does not show any acute or subacute infarction. No abnormality affects the brainstem or cerebellum. Cerebral hemispheres show age related volume loss without evidence of widespread small-vessel disease. Old small vessel infarction in the left thalamus. No cortical or large vessel territory abnormality. No mass lesion, hemorrhage, hydrocephalus or extra-axial collection. Vascular: Major vessels at the base of the brain show flow. Skull and upper cervical spine: Negative Sinuses/Orbits: Clear/normal Other: None IMPRESSION: No acute finding by MRI. Age related volume loss. Old small vessel infarction left thalamus. Electronically Signed   By: Paulina Fusi M.D.   On: 05/10/2020 11:13    Procedures Procedures (including critical care time)  Medications Ordered in ED Medications  sodium chloride flush (NS) 0.9 % injection 3 mL (3 mLs Intravenous Given 05/10/20 1033)  LORazepam (ATIVAN) injection 0.5 mg (0.5 mg Intravenous Given 05/10/20 1032)    ED Course  I have reviewed the triage vital signs and the nursing notes.  Pertinent labs & imaging results that were available during my care of the patient were reviewed by me and considered in my medical decision making (see chart for details).    MDM Rules/Calculators/A&P                         Presentation consistent with vertigo.  Some concern about possible central cause considering her prior history.  Symptoms are improving however with her history of prior stroke we will proceed with MRI to rule out acute posterior circulation event.  Initial laboratory tests are unremarkable.  No signs of anemia or dehydration or cardiac dysrhythmia  MRI does not show any signs of acute  stroke or abnormality.  Patient's old infarct is noted  ED work-up overall reassuring.  Doubt acute stroke or TIA.  Suspect symptoms may be related to peripheral vertigo.  We will plan on discharge home with a course of meclizine and Zofran.  Outpatient follow-up with PCP. Final Clinical Impression(s) / ED Diagnoses Final diagnoses:  Dizziness  Vertigo    Rx / DC Orders ED Discharge Orders         Ordered    ondansetron (ZOFRAN ODT) 8 MG disintegrating tablet  Every 8 hours PRN        05/10/20 1226    meclizine (ANTIVERT) 12.5 MG tablet  3 times daily PRN        05/10/20 1226           Linwood DibblesKnapp, Ilaisaane Marts, MD 05/10/20 1227

## 2020-06-24 ENCOUNTER — Other Ambulatory Visit: Payer: Self-pay | Admitting: Family Medicine

## 2020-06-24 DIAGNOSIS — Z1231 Encounter for screening mammogram for malignant neoplasm of breast: Secondary | ICD-10-CM

## 2020-07-03 ENCOUNTER — Other Ambulatory Visit: Payer: Self-pay | Admitting: Neurology

## 2020-07-11 ENCOUNTER — Other Ambulatory Visit: Payer: Self-pay | Admitting: Family Medicine

## 2020-07-11 DIAGNOSIS — E2839 Other primary ovarian failure: Secondary | ICD-10-CM

## 2020-08-03 ENCOUNTER — Ambulatory Visit: Payer: Medicare Other

## 2020-09-11 ENCOUNTER — Ambulatory Visit: Payer: Medicare Other

## 2020-10-20 ENCOUNTER — Ambulatory Visit (INDEPENDENT_AMBULATORY_CARE_PROVIDER_SITE_OTHER): Payer: Medicare Other | Admitting: Adult Health

## 2020-10-20 ENCOUNTER — Encounter: Payer: Self-pay | Admitting: Adult Health

## 2020-10-20 VITALS — BP 116/73 | HR 92 | Ht 62.0 in | Wt 190.0 lb

## 2020-10-20 DIAGNOSIS — R7303 Prediabetes: Secondary | ICD-10-CM | POA: Diagnosis not present

## 2020-10-20 DIAGNOSIS — R413 Other amnesia: Secondary | ICD-10-CM

## 2020-10-20 DIAGNOSIS — E785 Hyperlipidemia, unspecified: Secondary | ICD-10-CM | POA: Diagnosis not present

## 2020-10-20 DIAGNOSIS — I63432 Cerebral infarction due to embolism of left posterior cerebral artery: Secondary | ICD-10-CM

## 2020-10-20 NOTE — Patient Instructions (Signed)
We will check lab work today to rule out reversible causes of memory loss as well as recheck cholesterol and A1c levels   Continue aspirin 81 mg daily  and atorvastatin  for secondary stroke prevention  Continue to follow up with PCP regarding cholesterol, blood pressure and pre-diabetes management  Maintain strict control of hypertension with blood pressure goal below 130/90, pre-diabetes with hemoglobin A1c goal below 7% and cholesterol with LDL cholesterol (bad cholesterol) goal below 70 mg/dL.      Followup in the future with me in 6 months or call earlier if needed     Thank you for coming to see [place] at [facility]. I hope we have been able to provide you high quality care today.  You may receive a patient satisfaction survey over the next few weeks. We would appreciate your feedback and comments so that we may continue to improve ourselves and the health of our patients.

## 2020-10-20 NOTE — Progress Notes (Signed)
[facility] [address] [place]. [place] [address] [phone]       STROKE FOLLOW UP NOTE  Ms. Marie Walker Date of Birth:  12-Apr-1951 Medical Record Number:  [id number]   Referring MD: Aliene Beams  Reason for Referral: Stroke    Chief Complaint  Patient presents with  . Follow-up    Rm 14 with spouse Marie Walker) Pt is well and stable, some short term memory complications      HPI:   Today, 10/20/2020, Marie Walker returns for 72-month stroke follow-up accompanied by her husband.  Doing well since prior visit without new or reoccurring stroke/TIA symptoms.  She does report continued short-term memory issues but has been stable without worsening.  Continues to maintain ADLs and IADLs independently and routinely doing memory exercises.  Denies depression or anxiety.  Compliant on aspirin and atorvastatin without associated side effects.  Blood pressure today 116/73. Reports lab work by PCP completed in December with level satisfactory.  Routinely followed by [facility] for lower back and hip pain.  Reports recent left arm spasm upon reaching overhead only lasting for a couple seconds without any reoccurring symptoms.  Denies shoulder or arm pain but does endorse occasional neck tightness sensation.  No further concerns at this time    History provided for reference purposes only Update 04/21/2020 JM: Marie Walker returns for stroke follow-up accompanied by her husband. Stable since prior visit without residual deficits and denies new or reoccurring stroke/TIA symptoms. She does report some short term memory loss which has been present since her stroke but also endorses multiple other stressors that may be contributing.  She will occasionally forget recent conversations, repeat a question or forget what she wants on television.  She is able to maintain ADLs and IADLs independently.  Short-term memory concerns have been stable without worsening and does do daily memory exercises  such as crossword puzzles, sudoku and exercises on Lum[employer].  Remains on aspirin and atorvastatin 60 mg daily for secondary stroke prevention. She did have cholesterol levels checked by PCP around July or August and plans on repeating in December at yearly physical. Blood pressure today 130/82.  She does not routinely monitor at home.  No further concerns at this time.  Initial visit 12/26/2019 Dr. Pearlean Brownie: Marie Walker is a pleasant 70 year old Caucasian lady is seen today for initial office consultation visit for stroke.  She is accompanied by her husband.  History is obtained from them as well as review of referral notes.  No actual imaging films available in PACS for review today.  She has past medical history of hypertension hyperlipidemia obesity and gout who developed sudden onset of right upper extremity paresthesias from elbow down as well as some gait ataxia and stumbling on 09/11/2019.  She was admitted to [facility] and [place].  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  MR angiogram of the neck was unremarkable hemoglobin A1c was normal at 5.3 and LDL was elevated at 161 mg percent.  Echocardiogram showed normal ejection fraction of 65%.  She was started on aspirin Plavix and a statin.  She is out of follow-up lipid profile checked by primary physician in 11/04/2019 which had improved but was still  elevated 84 mg percent.  Her dose of Lipitor was however not increased she is on aspirin Plavix and tolerating fairly well but does complain of increased bruising.  She underwent 2-week Holter monitor placed by outpatient cardiologist at [facility] which showed no evidence of significant cardiac arrhythmias.  She  denies any history of syncope palpitations.  She states numbness and gait ataxia completely resolved within a day after admission.  She has had no recurrent stroke or TIA symptoms.  She states she is doing well and has no complaints today.  She has no prior history of strokes TIAs seizures or significant neurological problems.  No family history of strokes.   ROS:   14 system review of systems is positive for mild memory difficulties only and all other systems negative  PMH:  Past Medical History:  Diagnosis Date  . ADD (attention deficit disorder)   . Asthma   . Dermatitis   . Hyperlipidemia   . Hypertension   . Stroke [facility])     Social History:  Social History   Socioeconomic History  . Marital status: Married    Spouse name: Not on file  . Number of children: Not on file  . Years of education: Not on file  . Highest education level: Not on file  Occupational History  . Not on file  Tobacco Use  . Smoking status: Never Smoker  . Smokeless tobacco: Never Used  Substance and Sexual Activity  . Alcohol use: Yes    Alcohol/week: 2.0 standard drinks    Types: 1 Glasses of wine, 1 Shots of liquor per week    Comment: ocassionally  . Drug use: Not Currently  . Sexual activity: Not on file  Other Topics Concern  . Not on file  Social History Narrative  . Not on file   Social Determinants of Health   Financial Resource Strain: Not on file  Food Insecurity: Not on file  Transportation Needs: Not on file  Physical Activity: Not on file  Stress: Not on file  Social Connections: Not on file  Intimate Partner Violence: Not on file    Medications:   Current Outpatient Medications on File Prior to Visit  Medication Sig Dispense Refill  . albuterol (VENTOLIN HFA) 108 (90 Base) MCG/ACT inhaler every 4 (four) hours as needed.     Marland Kitchen allopurinol (ZYLOPRIM) 100 MG tablet Take 100 mg by mouth 2 (two) times daily.    Marland Kitchen amLODipine (NORVASC) 10 MG tablet amlodipine 10 mg tablet     . aspirin 81 MG chewable tablet Chew by mouth.    Marland Kitchen atorvastatin (LIPITOR) 40 MG tablet TAKE 1 AND 1/2 TABLETS (60 MG TOTAL) BY MOUTH DAILY. 60 tablet 3  . B COMPLEX VITAMINS ER PO Take by mouth.    Marland Kitchen BIOTIN PO Take by mouth.    . colchicine 0.6 MG tablet Take 0.6 mg by mouth as needed.     . Cyanocobalamin (B-12 SL) Place 5,000 mcg under the tongue.    . cyclobenzaprine (FLEXERIL) 10 MG tablet as needed.     . DULoxetine (CYMBALTA) 60 MG capsule Take 90 mg by mouth.     . hydrOXYzine (ATARAX/VISTARIL) 25 MG tablet Take 25 mg by mouth as needed.     . montelukast (SINGULAIR) 10 MG tablet Take by mouth at bedtime.     . Multiple Vitamin (CALCIUM COMPLEX PO) Take by mouth.    . pantoprazole (PROTONIX) 40 MG tablet Take 40 mg by mouth daily.  No current facility-administered medications on file prior to visit.    Allergies:   Allergies  Allergen Reactions  . Morphine Other (See Comments)    Adverse reaction  . Orphenadrine Other (See Comments)    Makes BP drop   . Oxycodone-Acetaminophen Other (See Comments)  . Other      Physical Exam Today's Vitals   10/20/20 0939  BP: 116/73  Pulse: 92  Weight: 190 lb (86.2 kg)  Height: 5\' 2"  (1.575 m)   Body mass index is 34.75 kg/m.  General: Obese very pleasant elderly Caucasian lady seated, in no evident distress Head: head normocephalic and atraumatic.   Neck: supple with no carotid or supraclavicular bruits Cardiovascular: regular rate and rhythm, no murmurs Musculoskeletal: no deformity Skin:  no rash/petichiae Vascular:  Normal pulses all extremities  Neurologic Exam Mental Status: Awake and fully alert.  Fluent speech and language.  Oriented to place and time. Recent memory subjectively impaired and remote memory intact. Attention span, concentration and fund of knowledge appropriate during visit. Mood and affect appropriate.  Recall 3/3. Serial addition good. Names 11 4-legged animals in 60 seconds.  Clock drawing  4/4 Cranial Nerves: Pupils equal, briskly reactive to light. Extraocular movements full without nystagmus. Visual fields full to confrontation. Hearing intact. Facial sensation intact. Face, tongue, palate moves normally and symmetrically.  Motor: Normal bulk and tone. Normal strength in all tested extremity muscles. Sensory.: intact to touch , pinprick , position and vibratory sensation.  Coordination: Rapid alternating movements normal in all extremities. Finger-to-nose and heel-to-shin performed accurately bilaterally. Gait and Station: Arises from chair without difficulty. Stance is normal. Gait demonstrates normal stride, and balance .  Difficulty performing heel, toe and tandem walk Reflexes: 1+ and symmetric. Toes downgoing.     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 Vascular risk factors of pre-DM, hypertension, hyperlipidemia, intracranial stenosis and obesity  1. L PCA stroke -Continue aspirin 81 mg daily and atorvastatin 60mg  daily for secondary stroke prevention  -Repeat lipid panel and A1c today -Discussed secondary stroke prevention measures and importance of close PCP follow-up to maintain strict control of hypertension with blood pressure goal below 130/90, prediabetes with A1c goal<7 and lipids with LDL cholesterol goal below 70 mg/dL.   2.  Mild cognitive impairment -Stable without worsening -Likely residual stroke deficit but will obtain dementia panel today to rule out reversible causes -Discussed continuing memory exercises and discussed importance of managing stroke risk factors, routine exercise, healthy diet and adequate sleep  3. Left arm spasm -only occurred 1 time of unknown cause -Advised to continue to monitor - if symptom reoccurs or associated with other symptoms such as pain, numbness/tingling or weakness  additional evaluation will be indicated at that time but can be deferred to [facility] or PCP    Follow-up in 6 months or call earlier if needed  CC:  [facility] provider: Dr. , [facility], MD    I spent 35 minutes of face-to-face and non-face-to-face time with patient and husband.  This included previsit chart review, lab review, study review, order entry, electronic health record documentation, patient education regarding history of prior stroke, short-term memory concerns, left arm spasm, importance of managing stroke risk factors and answered all other questions to patient and husband satisfaction  Leonette Most, [facility]  [facility] [address] [place], [address] [place]  Phone [phone] Fax  [phone] Note: This document was prepared with digital dictation and possible smart phrase technology. Any transcriptional errors that result from this process are unintentional.

## 2020-10-21 LAB — DEMENTIA PANEL
Homocysteine: 8.8 umol/L (ref 0.0–19.2)
RPR Ser Ql: NONREACTIVE
TSH: 1.95 u[IU]/mL (ref 0.450–4.500)
Vitamin B-12: >2000 pg/mL — ABNORMAL HIGH (ref 232–1245)

## 2020-10-21 LAB — LIPID PANEL
Chol/HDL Ratio: 2.7 ratio (ref 0.0–4.4)
Cholesterol, Total: 178 mg/dL (ref 100–199)
HDL: 66 mg/dL (ref >39)
LDL Chol Calc (NIH): 93 mg/dL (ref 0–99)
Triglycerides: 107 mg/dL (ref 0–149)
VLDL Cholesterol Cal: 19 mg/dL (ref 5–40)

## 2020-10-21 LAB — HEMOGLOBIN A1C
Est. average glucose Bld gHb Est-mCnc: 120 mg/dL
Hgb A1c MFr Bld: 5.8 % — ABNORMAL HIGH (ref 4.8–5.6)

## 2020-10-22 ENCOUNTER — Telehealth: Payer: Self-pay

## 2020-10-22 ENCOUNTER — Other Ambulatory Visit: Payer: Self-pay | Admitting: Adult Health

## 2020-10-22 MED ORDER — ATORVASTATIN CALCIUM 80 MG PO TABS: 80.0000 mg | ORAL_TABLET | Freq: Every day | ORAL | 3 refills | Status: DC

## 2020-10-22 NOTE — Telephone Encounter (Signed)
Contacted pt regarding labs, informed her that recent cholesterol levels showed LDL at 93 with goal less than 70. Marie Walker recommend increasing atorvastatin from 60 mg to 80 mg daily and a new order was sent to her pharmacy. A1c increased from 5.3 to 5.8, told her it is important to ensure adequate diet with low carbohydrates and sugar intake as well as increasing daily activity to help prevent progression into full diabetes. Also Dementia panel satisfactory. Advised to call the office with further questions, she understood.

## 2020-10-22 NOTE — Telephone Encounter (Signed)
-----   Message from Ihor Austin, NP sent at 10/22/2020  8:35 AM EDT ----- Please advise patient that recent cholesterol levels showed LDL at 93 with goal less than 70 -recommend increasing atorvastatin from 60 mg to 80 mg daily - a new order will be sent to her pharmacy A1c increased from 5.3 to 5.8 -important to ensure adequate diet with low carbohydrates and sugar intake as well as increasing daily activity to help prevent progression into full diabetes Dementia panel satisfactory

## 2020-10-28 ENCOUNTER — Other Ambulatory Visit: Payer: Self-pay

## 2020-10-28 ENCOUNTER — Ambulatory Visit
Admission: RE | Admit: 2020-10-28 | Discharge: 2020-10-28 | Disposition: A | Discharge status: Home / Self Care | Payer: Medicare Other | Source: Ambulatory Visit | Attending: Family Medicine | Admitting: Family Medicine

## 2020-10-28 DIAGNOSIS — Z1231 Encounter for screening mammogram for malignant neoplasm of breast: Secondary | ICD-10-CM

## 2020-11-03 ENCOUNTER — Other Ambulatory Visit: Payer: Self-pay | Admitting: Family Medicine

## 2020-11-03 DIAGNOSIS — Z780 Asymptomatic menopausal state: Secondary | ICD-10-CM

## 2020-11-03 DIAGNOSIS — Z13820 Encounter for screening for osteoporosis: Secondary | ICD-10-CM

## 2020-11-06 ENCOUNTER — Other Ambulatory Visit: Payer: Self-pay

## 2020-11-06 ENCOUNTER — Ambulatory Visit
Admission: RE | Admit: 2020-11-06 | Discharge: 2020-11-06 | Disposition: A | Discharge status: Home / Self Care | Payer: Medicare Other | Source: Ambulatory Visit | Attending: Family Medicine | Admitting: Family Medicine

## 2020-11-06 ENCOUNTER — Other Ambulatory Visit: Payer: Self-pay | Admitting: Family Medicine

## 2020-11-06 DIAGNOSIS — Z780 Asymptomatic menopausal state: Secondary | ICD-10-CM

## 2020-11-06 DIAGNOSIS — Z13820 Encounter for screening for osteoporosis: Secondary | ICD-10-CM

## 2020-11-06 DIAGNOSIS — R928 Other abnormal and inconclusive findings on diagnostic imaging of breast: Secondary | ICD-10-CM

## 2020-12-02 ENCOUNTER — Ambulatory Visit: Payer: Medicare Other

## 2020-12-02 ENCOUNTER — Other Ambulatory Visit: Payer: Self-pay

## 2020-12-02 ENCOUNTER — Ambulatory Visit
Admission: RE | Admit: 2020-12-02 | Discharge: 2020-12-02 | Disposition: A | Discharge status: Home / Self Care | Payer: Medicare Other | Source: Ambulatory Visit | Attending: Family Medicine | Admitting: Family Medicine

## 2020-12-02 DIAGNOSIS — R928 Other abnormal and inconclusive findings on diagnostic imaging of breast: Secondary | ICD-10-CM

## 2020-12-31 ENCOUNTER — Encounter (INDEPENDENT_AMBULATORY_CARE_PROVIDER_SITE_OTHER): Payer: Self-pay

## 2021-01-19 DIAGNOSIS — M545 Low back pain, unspecified: Secondary | ICD-10-CM | POA: Insufficient documentation

## 2021-01-29 DIAGNOSIS — G459 Transient cerebral ischemic attack, unspecified: Secondary | ICD-10-CM | POA: Insufficient documentation

## 2021-02-23 DIAGNOSIS — M47816 Spondylosis without myelopathy or radiculopathy, lumbar region: Secondary | ICD-10-CM | POA: Insufficient documentation

## 2021-03-04 ENCOUNTER — Encounter (HOSPITAL_BASED_OUTPATIENT_CLINIC_OR_DEPARTMENT_OTHER): Payer: Self-pay | Admitting: Emergency Medicine

## 2021-03-04 ENCOUNTER — Other Ambulatory Visit: Payer: Self-pay

## 2021-03-04 ENCOUNTER — Emergency Department (HOSPITAL_BASED_OUTPATIENT_CLINIC_OR_DEPARTMENT_OTHER): Payer: Medicare Other | Admitting: Radiology

## 2021-03-04 ENCOUNTER — Emergency Department (HOSPITAL_BASED_OUTPATIENT_CLINIC_OR_DEPARTMENT_OTHER)
Admission: EM | Admit: 2021-03-04 | Discharge: 2021-03-04 | Disposition: A | Discharge status: Home / Self Care | Payer: Medicare Other | Attending: Emergency Medicine | Admitting: Emergency Medicine

## 2021-03-04 DIAGNOSIS — R072 Precordial pain: Secondary | ICD-10-CM | POA: Insufficient documentation

## 2021-03-04 DIAGNOSIS — Z79899 Other long term (current) drug therapy: Secondary | ICD-10-CM | POA: Insufficient documentation

## 2021-03-04 DIAGNOSIS — Z7982 Long term (current) use of aspirin: Secondary | ICD-10-CM | POA: Insufficient documentation

## 2021-03-04 DIAGNOSIS — J45909 Unspecified asthma, uncomplicated: Secondary | ICD-10-CM | POA: Diagnosis not present

## 2021-03-04 DIAGNOSIS — Z96653 Presence of artificial knee joint, bilateral: Secondary | ICD-10-CM | POA: Diagnosis not present

## 2021-03-04 DIAGNOSIS — R079 Chest pain, unspecified: Secondary | ICD-10-CM

## 2021-03-04 DIAGNOSIS — I10 Essential (primary) hypertension: Secondary | ICD-10-CM | POA: Insufficient documentation

## 2021-03-04 LAB — BASIC METABOLIC PANEL
Anion gap: 10 (ref 5–15)
BUN: 15 mg/dL (ref 8–23)
CO2: 27 mmol/L (ref 22–32)
Calcium: 9.6 mg/dL (ref 8.9–10.3)
Chloride: 102 mmol/L (ref 98–111)
Creatinine, Ser: 0.87 mg/dL (ref 0.44–1.00)
GFR, Estimated: >60 mL/min (ref >60)
Glucose, Bld: 114 mg/dL — ABNORMAL HIGH (ref 70–99)
Potassium: 4.2 mmol/L (ref 3.5–5.1)
Sodium: 139 mmol/L (ref 135–145)

## 2021-03-04 LAB — CBC
HCT: 36.8 % (ref 36.0–46.0)
Hemoglobin: 12.1 g/dL (ref 12.0–15.0)
MCH: 32.3 pg (ref 26.0–34.0)
MCHC: 32.9 g/dL (ref 30.0–36.0)
MCV: 98.1 fL (ref 80.0–100.0)
Platelets: 246 10*3/uL (ref 150–400)
RBC: 3.75 MIL/uL — ABNORMAL LOW (ref 3.87–5.11)
RDW: 13.9 % (ref 11.5–15.5)
WBC: 5.5 10*3/uL (ref 4.0–10.5)
nRBC: 0 % (ref 0.0–0.2)

## 2021-03-04 LAB — TROPONIN I (HIGH SENSITIVITY)
Troponin I (High Sensitivity): 3 ng/L (ref <18)
Troponin I (High Sensitivity): 3 ng/L (ref <18)

## 2021-03-04 NOTE — ED Triage Notes (Signed)
Pt arrives pov with c/o midsternal squeezing CP that started approx 30 min pta while lying in bed. Pt denies radiation of pain, denies n/v or shob. Pt took 324mg  aspirin pta.

## 2021-03-04 NOTE — ED Provider Notes (Signed)
Care was taken over from Dr. Wallace Cullens.  Patient had presented with some sharp intermittent pains in her midsternal area.  It only lasted few seconds.  There is no exertional symptoms.  No shortness of breath or other associated symptoms.  No nausea or vomiting.  She is currently pain-free.  Her EKG does not show any ischemic changes.  Her labs are nonconcerning.  She has had 2 negative troponins.  Her chest x-ray is nonconcerning.  She was discharged home in good condition.  She was encouraged to follow-up with her PCP.  Return precautions were given.   Rolan Bucco, MD 03/04/21 [phone]

## 2021-03-04 NOTE — ED Provider Notes (Signed)
[facility] EMERGENCY DEPT Provider Note   CSN: [id number] Arrival date & time: 03/04/21  1355     History Chief Complaint  Patient presents with   Chest Pain    Marie Walker is a 70 y.o. female.  Pt is a 70 yo female with pmh as listed below presenting to ED for chest pain. Patient admits to sternal chest pain, non radiating, sharp, severe, intermittent, occurring at rest, x 1 hours. Patient denies fevers, chills, coughing. Denies hx of DVT/PE, leg swelling, hormone use, prolonged immobilization, recent surgeries/injuries. Pt denies previous MI.   Hx of stroke, htn, hyperlipidemia.    The history is provided by the patient. No language interpreter was used.  Chest Pain Associated symptoms: no abdominal pain, no back pain, no cough, no fever, no palpitations, no shortness of breath and no vomiting       Past Medical History:  Diagnosis Date   ADD (attention deficit disorder)    Asthma    Dermatitis    Hyperlipidemia    Hypertension    Stroke (HCC)     There are no problems to display for this patient.   Past Surgical History:  Procedure Laterality Date   BREAST BIOPSY     REPLACEMENT TOTAL KNEE BILATERAL     right foot fracture      TONSILLECTOMY       OB History   No obstetric history on file.     History reviewed. No pertinent family history.  Social History   Tobacco Use   Smoking status: Never   Smokeless tobacco: Never  Substance Use Topics   Alcohol use: Yes    Alcohol/week: 2.0 standard drinks    Types: 1 Glasses of wine, 1 Shots of liquor per week    Comment: ocassionally   Drug use: Not Currently    Home Medications Prior to Admission medications   Medication Sig Start Date End Date Taking? Authorizing Provider  albuterol (VENTOLIN HFA) 108 (90 Base) MCG/ACT inhaler every 4 (four) hours as needed.  11/05/19  Yes [provider]  allopurinol (ZYLOPRIM) 100 MG tablet Take 100 mg by mouth 2 (two) times daily.   Yes  [provider]  amLODipine (NORVASC) 10 MG tablet amlodipine 10 mg tablet 09/03/19  Yes [provider]  aspirin 81 MG chewable tablet Chew by mouth. 09/01/19  Yes [provider]  atorvastatin (LIPITOR) 80 MG tablet Take 1 tablet (80 mg total) by mouth daily. 10/22/20  Yes Ihor Austin, NP  B COMPLEX VITAMINS ER PO Take by mouth.   Yes [provider]  BIOTIN PO Take by mouth.   Yes [provider]  DULoxetine (CYMBALTA) 60 MG capsule Take 90 mg by mouth.    Yes [provider]  montelukast (SINGULAIR) 10 MG tablet Take by mouth at bedtime.  10/28/19  Yes [provider]  Multiple Vitamin (CALCIUM COMPLEX PO) Take by mouth.   Yes [provider]  pantoprazole (PROTONIX) 40 MG tablet Take 40 mg by mouth daily. 12/19/19  Yes [provider]  traMADol (ULTRAM) 50 MG tablet Take 50 mg by mouth 3 (three) times daily as needed. 02/23/21  Yes [provider]  cyclobenzaprine (FLEXERIL) 10 MG tablet as needed.     [provider]  hydrOXYzine (ATARAX/VISTARIL) 25 MG tablet Take 25 mg by mouth as needed.  11/05/19   [provider]    Allergies    Morphine, Orphenadrine, Oxycodone-acetaminophen, Augmentin [amoxicillin-pot clavulanate], Doxycycline, and Other  Review of Systems   Review of Systems  Constitutional:  Negative for chills and fever.  HENT:  Negative for ear pain and sore throat.   Eyes:  Negative for pain and visual disturbance.  Respiratory:  Negative for cough and shortness of breath.   Cardiovascular:  Positive for chest pain. Negative for palpitations.  Gastrointestinal:  Negative for abdominal pain and vomiting.  Genitourinary:  Negative for dysuria and hematuria.  Musculoskeletal:  Negative for arthralgias and back pain.  Skin:  Negative for color change and rash.  Neurological:  Negative for seizures and syncope.  All other systems reviewed and are negative.  Physical  Exam Updated Vital Signs BP (!) 142/65 (BP Location: Left Arm)   Pulse 89   Temp 98.7 F (37.1 C) (Oral)   Resp 16   Ht 5\' 2"  (1.575 m)   Wt 83 kg   SpO2 99%   BMI 33.47 kg/m   Physical Exam Vitals and nursing note reviewed.  Constitutional:      General: She is not in acute distress.    Appearance: She is well-developed.  HENT:     Head: Normocephalic and atraumatic.  Eyes:     Conjunctiva/sclera: Conjunctivae normal.  Cardiovascular:     Rate and Rhythm: Normal rate and regular rhythm.     Heart sounds: No murmur heard. Pulmonary:     Effort: Pulmonary effort is normal. No respiratory distress.     Breath sounds: Normal breath sounds.  Abdominal:     Palpations: Abdomen is soft.     Tenderness: There is no abdominal tenderness.  Musculoskeletal:     Cervical back: Neck supple.  Skin:    General: Skin is warm and dry.  Neurological:     Mental Status: She is alert.    ED Results / Procedures / Treatments   Labs (all labs ordered are listed, but only abnormal results are displayed) Labs Reviewed  BASIC METABOLIC PANEL  CBC  TROPONIN I (HIGH SENSITIVITY)    EKG None  Radiology No results found.  Procedures Procedures   Medications Ordered in ED Medications - No data to display  ED Course  I have reviewed the triage vital signs and the nursing notes.  Pertinent labs & imaging results that were available during my care of the patient were reviewed by me and considered in my medical decision making (see chart for details).    MDM Rules/Calculators/A&P                          2:30 PM 70 yo female with pmh as listed below presenting to ED for chest pain. Patient is Aox3, no acute distress, afebrile, stable vitals.  Chest Pain -no rashes -not reproducible -EKG stable with no ST segment elevation or depresion. No abnormal intervals. No q waves or t wave inversions. Troponin pending. -Stable CXR with no pneumothorax. No pneumonia -Denies hx of  DVT/PE, leg swelling, hormone use, prolonged immobilization, recent surgeries/injuries. Unable to use PERC criteria due to age but low suspicion.  -Heart Score 4   3:20 PM Pt signed out to oncoming physician while awaiting troponins.    Final Clinical Impression(s) / ED Diagnoses Final diagnoses:  Chest pain, unspecified type    Rx / DC Orders ED Discharge Orders     None        61, DO 03/04/21 1524

## 2021-04-19 ENCOUNTER — Telehealth: Payer: Self-pay | Admitting: Adult Health

## 2021-04-19 ENCOUNTER — Other Ambulatory Visit: Payer: Self-pay

## 2021-04-19 ENCOUNTER — Ambulatory Visit (INDEPENDENT_AMBULATORY_CARE_PROVIDER_SITE_OTHER): Payer: Medicare Other | Admitting: Adult Health

## 2021-04-19 ENCOUNTER — Encounter: Payer: Self-pay | Admitting: Adult Health

## 2021-04-19 VITALS — BP 138/87 | HR 80 | Ht 62.0 in | Wt 188.0 lb

## 2021-04-19 DIAGNOSIS — M542 Cervicalgia: Secondary | ICD-10-CM | POA: Diagnosis not present

## 2021-04-19 DIAGNOSIS — R251 Tremor, unspecified: Secondary | ICD-10-CM | POA: Diagnosis not present

## 2021-04-19 DIAGNOSIS — I63432 Cerebral infarction due to embolism of left posterior cerebral artery: Secondary | ICD-10-CM | POA: Diagnosis not present

## 2021-04-19 DIAGNOSIS — I10 Essential (primary) hypertension: Secondary | ICD-10-CM

## 2021-04-19 DIAGNOSIS — E785 Hyperlipidemia, unspecified: Secondary | ICD-10-CM | POA: Diagnosis not present

## 2021-04-19 NOTE — Progress Notes (Signed)
I agree with the above plan 

## 2021-04-19 NOTE — Patient Instructions (Signed)
You will be called to complete cervical imaging to ensure no concerns that could be contributing to your recent symptoms  Please ensure you are monitoring and reordering - when symptoms start, what exact symptoms present, blood pressure check, how long it lasts for  Continue aspirin 81 mg daily  and atorvastatin 80mg  daily  for secondary stroke prevention  Continue to follow up with PCP regarding cholesterol and blood pressure management  Maintain strict control of hypertension with blood pressure goal below 130/90 and cholesterol with LDL cholesterol (bad cholesterol) goal below 70 mg/dL.     Follow up with Dr. in December as scheduled      Thank you for coming to see [facility] at [facility]. I hope we have been able to provide you high quality care today.  You may receive a patient satisfaction survey over the next few weeks. We would appreciate your feedback and comments so that we may continue to improve ourselves and the health of our patients.

## 2021-04-19 NOTE — Telephone Encounter (Signed)
Medicare/bcbs fed order sent to GI, [facility] they will reach out to the patient to schedule.  °

## 2021-04-19 NOTE — Progress Notes (Signed)
[facility] [address] [place] [place] [address] [phone]       STROKE FOLLOW UP NOTE  [name] Date of Birth:  August 10, 1950 Medical Record Number:  [id number]    Primary neurologist: Dr. Pearlean Brownie Reason for Referral: Stroke    Chief Complaint  Patient presents with   Follow-up    Rm 3 with spouse Marie Walker  Pt is well and stable, no new stroke concerns  Having muscle spasms       HPI:   Update 04/19/2021 JM: Returns for 72-month stroke follow-up accompanied by her husband, Marie Walker.  Overall stable without new stroke/TIA symptoms.  Reports continued short-term memory difficulties stable without worsening.  Maintains ADLs and IADLs independently.  Compliant on aspirin and atorvastatin without side effects.  Blood pressure today 138/87.   Reports over the past 6 months, she has experienced 2-3 episodes of right leg tremor while standing associated with dizziness sensation. Typically resolves after sitting or taking pressure off from leg. Reports approx 1 month ago, she experienced dizziness sensation upon standing and after walking short distance (into kitchen) her legs started shaking bilaterally and then progressed to bilateral upper extremity tremor with difficulty holding on to a cup that was in her hands. This lasted approx 2-3 minutes. This led to a fall but thankfully without injury or hitting her head. She has not had any additional episodes since that time. She is scheduled in December with Dr. Pearlean Brownie for further evaluation  Routinely followed by [facility] for chronic low back pain - completed imaging a few months ago for worsening back pain - unable to view via epic. Mild neck pain/tightness but denies radiculopathy.  Recently diagnosed with shingles 5-6 weeks ago - residual nerve pain left side waist to shoulder - currently on gabapentin 300 mg 3 times daily -recent episode occurred prior to starting gabapentin.     History provided for reference  purposes only Update 10/20/2020 JM: [name] returns for 72-month stroke follow-up accompanied by her husband.  Doing well since prior visit without new or reoccurring stroke/TIA symptoms.  She does report continued short-term memory issues but has been stable without worsening.  Continues to maintain ADLs and IADLs independently and routinely doing memory exercises.  Denies depression or anxiety.  Compliant on aspirin and atorvastatin without associated side effects.  Blood pressure today 116/73. Reports lab work by PCP completed in December with level satisfactory.  Routinely followed by [facility] for lower back and hip pain.  Reports recent left arm spasm upon reaching overhead only lasting for a couple seconds without any reoccurring symptoms.  Denies shoulder or arm pain but does endorse occasional neck tightness sensation.  No further concerns at this time  Update 04/21/2020 JM: Ms. Baik returns for stroke follow-up accompanied by her husband. Stable since prior visit without residual deficits and denies new or reoccurring stroke/TIA symptoms. She does report some short term memory loss which has been present since her stroke but also endorses multiple other stressors that may be contributing.  She will occasionally forget recent conversations, repeat a question or forget what she wants on television.  She is able to maintain ADLs and IADLs independently.  Short-term memory concerns have been stable without worsening and does do daily memory exercises such as crossword puzzles, sudoku and exercises on Lumosity.  Remains on aspirin and atorvastatin 60 mg daily for secondary stroke prevention. She did have cholesterol levels checked by PCP around July or August and plans on repeating in December  at yearly physical. Blood pressure today 130/82.  She does not routinely monitor at home.  No further concerns at this time.  Initial visit 12/26/2019 Dr. Pearlean Brownie: [name] is a pleasant 70 year old Caucasian lady is seen  today for initial office consultation visit for stroke.  She is accompanied by her husband.  History is obtained from them as well as review of referral notes.  No actual imaging films available in PACS for review today.  She has past medical history of hypertension hyperlipidemia obesity and gout who developed sudden onset of right upper extremity paresthesias from elbow down as well as some gait ataxia and stumbling on 09/11/2019.  She was [facility] to [facility] [place].  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  MR angiogram of the neck was unremarkable hemoglobin A1c was normal at 5.3 and LDL was elevated at 161 mg percent.  Echocardiogram showed normal ejection fraction of 65%.  She was started on aspirin Plavix and a statin.  She is out of follow-up lipid profile checked by primary physician in 11/04/2019 which had improved but was still elevated 84 mg percent.  Her dose of Lipitor was however not increased she is on aspirin Plavix and tolerating fairly well but does complain of increased bruising.  She underwent 2-week Holter monitor placed by outpatient cardiologist at [facility] which showed no evidence of significant cardiac arrhythmias.  She denies any history of syncope palpitations.  She states numbness and gait ataxia completely resolved within a day after admission.  She has had no recurrent stroke or TIA symptoms.  She states she is doing well and has no complaints today.  She has no prior history of strokes TIAs seizures or significant neurological problems.  No family history of strokes.   ROS:   14 system review of systems is positive for those listed in HPI and all other systems  negative  PMH:  Past Medical History:  Diagnosis Date   ADD (attention deficit disorder)    Asthma    Dermatitis    Hyperlipidemia    Hypertension    Stroke [facility])     Social History:  Social History   Socioeconomic History   Marital status: Married    Spouse name: Not on file   Number of children: Not on file   Years of education: Not on file   Highest education level: Not on file  Occupational History   Not on file  Tobacco Use   Smoking status: Never   Smokeless tobacco: Never  Substance and Sexual Activity   Alcohol use: Yes    Alcohol/week: 2.0 standard drinks    Types: 1 Glasses of wine, 1 Shots of liquor per week    Comment: ocassionally   Drug use: Not Currently   Sexual activity: Not on file  Other Topics Concern   Not on file  Social History Narrative   Not on file   Social Determinants of Health   Financial Resource Strain: Not on file  Food Insecurity: Not on file  Transportation Needs: Not on file  Physical Activity: Not on file  Stress: Not on file  Social Connections: Not on file  Intimate Partner Violence: Not on file  Medications:   Current Outpatient Medications on File Prior to Visit  Medication Sig Dispense Refill   albuterol (VENTOLIN HFA) 108 (90 Base) MCG/ACT inhaler every 4 (four) hours as needed.      allopurinol (ZYLOPRIM) 100 MG tablet Take 100 mg by mouth 2 (two) times daily.     amLODipine (NORVASC) 10 MG tablet amlodipine 10 mg tablet     aspirin 81 MG chewable tablet Chew by mouth.     atorvastatin (LIPITOR) 80 MG tablet Take 1 tablet (80 mg total) by mouth daily. 90 tablet 3   B COMPLEX VITAMINS ER PO Take by mouth.     BIOTIN PO Take by mouth.     DULoxetine (CYMBALTA) 60 MG capsule Take 90 mg by mouth.      hydrOXYzine (ATARAX/VISTARIL) 25 MG tablet Take 25 mg by mouth as needed.      montelukast (SINGULAIR) 10 MG tablet Take by mouth at bedtime.      pantoprazole (PROTONIX) 40 MG tablet Take 40 mg by mouth daily.      No current facility-administered medications on file prior to visit.    Allergies:   Allergies  Allergen Reactions   Morphine Other (See Comments)    Adverse reaction   Orphenadrine Other (See Comments)    Makes BP drop    Oxycodone-Acetaminophen Other (See Comments)   Augmentin [Amoxicillin-Pot Clavulanate] Nausea And Vomiting   Doxycycline Nausea And Vomiting   Other      Physical Exam Today's Vitals   04/19/21 1011  BP: 138/87  Pulse: 80  Weight: 188 lb (85.3 kg)  Height: 5\' 2"  (1.575 m)    Body mass index is 34.39 kg/m.  General: Obese very pleasant elderly Caucasian lady seated, in no evident distress Head: head normocephalic and atraumatic.   Neck: supple with no carotid or supraclavicular bruits Cardiovascular: regular rate and rhythm, no murmurs Musculoskeletal: no deformity Skin:  no rash/petichiae Vascular:  Normal pulses all extremities  Neurologic Exam Mental Status: Awake and fully alert.  Fluent speech and language.  Oriented to place and time. Recent memory subjectively impaired and remote memory intact. Attention span, concentration and fund of knowledge appropriate during visit. Mood and affect appropriate.  Cranial Nerves: Pupils equal, briskly reactive to light. Extraocular movements full without nystagmus. Visual fields full to confrontation. Hearing intact. Facial sensation intact. Face, tongue, palate moves normally and symmetrically.  Motor: Normal bulk and tone. Normal strength in all tested extremity muscles. Sensory.: intact to touch , pinprick , position and vibratory sensation.  Coordination: Rapid alternating movements normal in all extremities. Finger-to-nose and heel-to-shin performed accurately bilaterally. Mild L>R postural tremor. Negative cogwheel rigidity or bradykinesia Gait and Station: Arises from chair without difficulty. Stance is normal. Gait demonstrates normal stride, and balance without use of assistive device.  Difficulty  performing heel, toe and tandem walk Reflexes: 1+ and symmetric. Toes downgoing.     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 Vascular risk factors of pre-DM, hypertension, hyperlipidemia, intracranial stenosis and obesity. 6 month onset of 2-3 episodes of intermittent RLE tremor while standing and 1 month ago, experienced BLE tremor which progressed to BUE tremor leading a fall lasting 2-3 minutes.     1. L PCA stroke -Continue aspirin 81 mg daily and atorvastatin 80mg  daily for secondary stroke prevention  -LDL 93 (09/2020) - increased atorvastatin from 60 mg  to 80 mg daily - repeat lipid panel today -A1c 5.8 (09/2020) -Discussed secondary stroke prevention measures and importance of close PCP follow-up to maintain strict control of hypertension with blood pressure goal below 130/90, prediabetes with A1c goal<7 and lipids with LDL cholesterol goal below 70 mg/dL.   2.  Mild cognitive impairment -Stable without worsening -Dementia panel WNL -Discussed continuing memory exercises and discussed importance of managing stroke risk factors, routine exercise, healthy diet and adequate sleep  3. Tremor of unknown etiology -does have mild BUE postural tremor on exam - chronic per patient -?  Orthostatic tremor -scheduled 12/13 with Dr. Pearlean Brownie for further evaluation -encouraged keeping a diary of events including when events occur and duration, any precipitating symptoms (currently experiencing dizziness prior to tremor), how long symptoms lasted for, and any other additional symptoms associated -check blood pressure around events due to dizziness sensation -obtain MR cervical imaging to rule out any spinal issues contributing (mild neck pain, chronic lumbar issues) -release form completed for [facility] to send recent MR lumbar imaging   -discussed potential need of EMG/NCV and lab work but will defer until f/u visit with Dr. Pearlean Brownie unless symptoms should progress during interval time     F/u with Dr. Pearlean Brownie in December as scheduled     CC:  [facility] provider: Dr. Leonette Most, Fleet Contras, MD    I spent 42 minutes of face-to-face and non-face-to-face time with patient and husband.  This included previsit chart review, lab review, study review, order entry, electronic health record documentation, patient and husband education and discussion regarding history of prior stroke, importance of managing stroke risk factors and secondary stroke prevention measures, 6 month duration of tremor and possible etiologies and answered all other questions to patient and husband satisfaction  Ihor Austin, AGNP-BC  [facility] [address] [place], [place] [address]  Phone [phone] Fax [phone] Note: This document was prepared with digital dictation and possible smart phrase technology. Any transcriptional errors that result from this process are unintentional.

## 2021-04-27 ENCOUNTER — Telehealth: Payer: Self-pay | Admitting: *Deleted

## 2021-04-27 NOTE — Telephone Encounter (Signed)
Received MRI lumbar spine results from [facility],  placed on NP desk for review.

## 2021-04-30 ENCOUNTER — Ambulatory Visit
Admission: RE | Admit: 2021-04-30 | Discharge: 2021-04-30 | Disposition: A | Discharge status: Home / Self Care | Payer: Medicare Other | Source: Ambulatory Visit | Attending: Adult Health | Admitting: Adult Health

## 2021-04-30 ENCOUNTER — Other Ambulatory Visit: Payer: Self-pay

## 2021-04-30 DIAGNOSIS — M542 Cervicalgia: Secondary | ICD-10-CM | POA: Diagnosis not present

## 2021-05-05 NOTE — Telephone Encounter (Signed)
MR lumbar spine 02/08/2021  IMPRESSION:  1.  Multilevel degenerative changes of the lumbar spine, most prominent at L4-5 and L3-4 2.  At L4-5, mild to moderate central canal stenosis.  Bilateral lateral recess stenosis, mild on the right and moderate on the left.  Moderate bilateral foraminal narrowing with probable encroachment on the L4 dorsal root ganglia.  Advanced facet arthrosis with 6 mm anterolisthesis of L4 on L5. 3.  At L3-4, mild central canal stenosis and mild left lateral recess stenosis.  Severe left neuroforaminal narrowing with encroachment on the left L3 dorsal root ganglion 4.  At L2-3, mild central canal stenosis.  A mild diffuse increase of the posterior epidural fat which contributes to stenosis.  Moderate bilateral facet arthrosis with mild bilateral facet synovitis 5.  At L5-S1, L5 articulates abnormally with the right sacral ala, developmental in origin.  Bulge and annular fissure of the disc.  No neural displacement of encroachment.    MR hip 12/16/2020  IMPRESSION  1.  Mild tendinosis of left gluteus minimus insertion, with mild peritendinitis and left trochanteric bursitis 2.  Mild tendinosis and partial tearing of the left hamstring origin 3.  Degenerative tearing along the anterior aspect of the superior labrum.  Mild chondral thinning throughout the left hip 4.  Small left hip effusion

## 2021-06-08 ENCOUNTER — Other Ambulatory Visit: Payer: Self-pay

## 2021-06-08 ENCOUNTER — Ambulatory Visit (INDEPENDENT_AMBULATORY_CARE_PROVIDER_SITE_OTHER): Payer: Medicare Other | Admitting: Neurology

## 2021-06-08 ENCOUNTER — Encounter: Payer: Self-pay | Admitting: Neurology

## 2021-06-08 VITALS — BP 131/84 | HR 91 | Ht 62.0 in | Wt 191.5 lb

## 2021-06-08 DIAGNOSIS — J302 Other seasonal allergic rhinitis: Secondary | ICD-10-CM | POA: Insufficient documentation

## 2021-06-08 DIAGNOSIS — G3184 Mild cognitive impairment, so stated: Secondary | ICD-10-CM

## 2021-06-08 DIAGNOSIS — I6990 Unspecified sequelae of unspecified cerebrovascular disease: Secondary | ICD-10-CM

## 2021-06-08 DIAGNOSIS — M1610 Unilateral primary osteoarthritis, unspecified hip: Secondary | ICD-10-CM | POA: Insufficient documentation

## 2021-06-08 DIAGNOSIS — I10 Essential (primary) hypertension: Secondary | ICD-10-CM | POA: Insufficient documentation

## 2021-06-08 DIAGNOSIS — F411 Generalized anxiety disorder: Secondary | ICD-10-CM | POA: Insufficient documentation

## 2021-06-08 DIAGNOSIS — M797 Fibromyalgia: Secondary | ICD-10-CM | POA: Insufficient documentation

## 2021-06-08 DIAGNOSIS — I63432 Cerebral infarction due to embolism of left posterior cerebral artery: Secondary | ICD-10-CM | POA: Diagnosis not present

## 2021-06-08 DIAGNOSIS — M109 Gout, unspecified: Secondary | ICD-10-CM | POA: Insufficient documentation

## 2021-06-08 DIAGNOSIS — H9190 Unspecified hearing loss, unspecified ear: Secondary | ICD-10-CM | POA: Insufficient documentation

## 2021-06-08 DIAGNOSIS — R251 Tremor, unspecified: Secondary | ICD-10-CM | POA: Diagnosis not present

## 2021-06-08 DIAGNOSIS — F988 Other specified behavioral and emotional disorders with onset usually occurring in childhood and adolescence: Secondary | ICD-10-CM | POA: Insufficient documentation

## 2021-06-08 DIAGNOSIS — M1A00X0 Idiopathic chronic gout, unspecified site, without tophus (tophi): Secondary | ICD-10-CM | POA: Insufficient documentation

## 2021-06-08 DIAGNOSIS — E669 Obesity, unspecified: Secondary | ICD-10-CM | POA: Insufficient documentation

## 2021-06-08 DIAGNOSIS — H6090 Unspecified otitis externa, unspecified ear: Secondary | ICD-10-CM | POA: Insufficient documentation

## 2021-06-08 DIAGNOSIS — E7800 Pure hypercholesterolemia, unspecified: Secondary | ICD-10-CM | POA: Insufficient documentation

## 2021-06-08 DIAGNOSIS — J45909 Unspecified asthma, uncomplicated: Secondary | ICD-10-CM | POA: Insufficient documentation

## 2021-06-08 DIAGNOSIS — K219 Gastro-esophageal reflux disease without esophagitis: Secondary | ICD-10-CM | POA: Insufficient documentation

## 2021-06-08 DIAGNOSIS — R253 Fasciculation: Secondary | ICD-10-CM | POA: Insufficient documentation

## 2021-06-08 NOTE — Progress Notes (Signed)
[facility] [address] [place]. [place] [address] [phone]       STROKE FOLLOW UP NOTE  Ms. Marie Walker Date of Birth:  09-28-1950 Medical Record Number:  [id number]    Primary neurologist: Dr. Pearlean Brownie Reason for Referral: Stroke    Chief Complaint  Patient presents with   New Patient (Initial Visit)    Rm 14. Accompanied by partner, Rosanne Ashing. NX Boniface Goffe 2021/McCue 2022/[employer]. Muscle/tremors,fasciculations with associated dizziness.      HPI:   Update 72/24/2022 JM: Returns for 72-month stroke follow-up accompanied by her husband, Marie Walker.  Overall stable without new stroke/TIA symptoms.  Reports continued short-term memory difficulties stable without worsening.  Maintains ADLs and IADLs independently.  Compliant on aspirin and atorvastatin without side effects.  Blood pressure today 138/87.   Reports over the past 6 months, she has experienced 2-3 episodes of right leg tremor while standing associated with dizziness sensation. Typically resolves after sitting or taking pressure off from leg. Reports approx 1 month ago, she experienced dizziness sensation upon standing and after walking short distance (into kitchen) her legs started shaking bilaterally and then progressed to bilateral upper extremity tremor with difficulty holding on to a cup that was in her hands. This lasted approx 2-3 minutes. This led to a fall but thankfully without injury or hitting her head. She has not had any additional episodes since that time. She is scheduled in December with Dr. Pearlean Brownie for further evaluation  Routinely followed by [facility] for chronic low back pain - completed imaging a few months ago for worsening back pain - unable to view via epic. Mild neck pain/tightness but denies radiculopathy.  Recently diagnosed with shingles 5-6 weeks ago - residual nerve pain left side waist to shoulder - currently on gabapentin 300 mg 3 times daily -recent episode occurred prior to  starting gabapentin.     History provided for reference purposes only Update 72/26/2022 JM: Mrs. Walker returns for 72-month stroke follow-up accompanied by her husband.  Doing well since prior visit without new or reoccurring stroke/TIA symptoms.  She does report continued short-term memory issues but has been stable without worsening.  Continues to maintain ADLs and IADLs independently and routinely doing memory exercises.  Denies depression or anxiety.  Compliant on aspirin and atorvastatin without associated side effects.  Blood pressure today 116/73. Reports lab work by PCP completed in December with level satisfactory.  Routinely followed by [facility] for lower back and hip pain.  Reports recent left arm spasm upon reaching overhead only lasting for a couple seconds without any reoccurring symptoms.  Denies shoulder or arm pain but does endorse occasional neck tightness sensation.  No further concerns at this time  Update 72/26/2021 JM: Marie Walker returns for stroke follow-up accompanied by her husband. Stable since prior visit without residual deficits and denies new or reoccurring stroke/TIA symptoms. She does report some short term memory loss which has been present since her stroke but also endorses multiple other stressors that may be contributing.  She will occasionally forget recent conversations, repeat a question or forget what she wants on television.  She is able to maintain ADLs and IADLs independently.  Short-term memory concerns have been stable without worsening and does do daily memory exercises such as crossword puzzles, sudoku and exercises on Lumosity.  Remains on aspirin and atorvastatin 60 mg daily for secondary stroke prevention. She did have cholesterol levels checked by PCP around July or August and plans on repeating in December at yearly  physical. Blood pressure today 130/82.  She does not routinely monitor at home.  No further concerns at this time.  Initial visit 12/26/2019 Dr.  Pearlean Brownie: Marie Walker is a pleasant 70 year old Caucasian lady is seen today for initial office consultation visit for stroke.  She is accompanied by her husband.  History is obtained from them as well as review of referral notes.  No actual imaging films available in PACS for review today.  She has past medical history of hypertension hyperlipidemia obesity and gout who developed sudden onset of right upper extremity paresthesias from elbow down as well as some gait ataxia and stumbling on 09/11/2019.  She was admitted to [facility] and [place].  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  MR angiogram of the neck was unremarkable hemoglobin A1c was normal at 5.3 and LDL was elevated at 161 mg percent.  Echocardiogram showed normal ejection fraction of 65%.  She was started on aspirin Plavix and a statin.  She is out of follow-up lipid profile checked by primary physician in 11/04/2019 which had improved but was still elevated 84 mg percent.  Her dose of Lipitor was however not increased she is on aspirin Plavix and tolerating fairly well but does complain of increased bruising.  She underwent 2-week Holter monitor placed by outpatient cardiologist at [facility] which showed no evidence of significant cardiac arrhythmias.  She denies any history of syncope palpitations.  She states numbness and gait ataxia completely resolved within a day after admission.  She has had no recurrent stroke or TIA symptoms.  She states she is doing well and has no complaints today.  She has no prior history of strokes TIAs seizures or significant neurological problems.  No family history of strokes. Update 06/08/2021: She returns for follow-up  after last visit with Shanda Bumps nurse practitioner on 04/19/2021.  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  Patient however states that since that visit the tremor has practically disappeared in a few weeks after that visit.  She has had no tremors in the last several months.  She states her short-term memory difficulties are about unchanged.  Memory loss is not progressive.  She does do regular cognitive exercises on the app [email or web address]  She also play some games on her phone.  She has had no recurrent stroke or TIA symptoms since her original stroke 2 years ago.  She remains on aspirin which is tolerating well without bleeding or bruising.  Her blood pressure is under good control today it is 131/84.  She is tolerating Lipitor well without muscle aches and pains.  She has no new complaints today.  She has upcoming annual physical exam due with primary care physician and will have follow-up lab work done at that visit.  ROS:   14 system review of systems is positive for those listed in HPI and all other systems negative  PMH:  Past Medical History:  Diagnosis Date   ADD (attention deficit disorder)    Asthma    Dermatitis    Hyperlipidemia    Hypertension    Stroke [facility])     Social History:  Social History   Socioeconomic History   Marital status: Married    Spouse name: Not on file   Number of children: Not on file   Years of education: Not on file   Highest education level: Not on file  Occupational History   Not on file  Tobacco Use   Smoking status: Never   Smokeless tobacco: Never  Substance and Sexual Activity   Alcohol use: Yes    Alcohol/week: 2.0 standard drinks    Types: 1 Glasses of wine, 1 Shots of liquor per week    Comment: ocassionally   Drug use: Not Currently   Sexual activity: Not on file  Other Topics Concern   Not on file  Social History Narrative   Not on file    Social Determinants of Health   Financial Resource Strain: Not on file  Food Insecurity: Not on file  Transportation Needs: Not on file  Physical Activity: Not on file  Stress: Not on file  Social Connections: Not on file  Intimate Partner Violence: Not on file    Medications:   Current Outpatient Medications on File Prior to Visit  Medication Sig Dispense Refill   albuterol (VENTOLIN HFA) 108 (90 Base) MCG/ACT inhaler every 4 (four) hours as needed.      allopurinol (ZYLOPRIM) 100 MG tablet Take 100 mg by mouth 2 (two) times daily.     amLODipine (NORVASC) 10 MG tablet amlodipine 10 mg tablet     aspirin 81 MG EC tablet Chew by mouth.     atorvastatin (LIPITOR) 80 MG tablet Take 1 tablet (80 mg total) by mouth daily. 90 tablet 3   B COMPLEX VITAMINS ER PO Take by mouth.     BIOTIN PO Take by mouth.     DULoxetine (CYMBALTA) 60 MG capsule Take 90 mg by mouth.      hydrOXYzine (ATARAX/VISTARIL) 25 MG tablet Take 25 mg by mouth as needed.      montelukast (SINGULAIR) 10 MG tablet Take by mouth at bedtime.      pantoprazole (PROTONIX) 40 MG tablet Take 40 mg by mouth daily.     No current facility-administered medications on file prior to visit.    Allergies:   Allergies  Allergen Reactions   Morphine Other (See Comments)    Adverse reaction   Orphenadrine Other (See Comments)    Makes BP drop    Oxycodone-Acetaminophen Other (See Comments)   Augmentin [Amoxicillin-Pot Clavulanate] Nausea And Vomiting   Doxycycline Nausea And Vomiting   Orphenadrine Citrate Swelling   Other      Physical Exam Today's Vitals   06/08/21 1042  BP: 131/84  Pulse: 91  Weight: 191 lb 8 oz (86.9 kg)  Height: 5\' 2"  (1.575 m)    Body mass index is 35.03 kg/m.  General: Obese very pleasant elderly Caucasian lady seated, in no evident distress Head: head normocephalic and atraumatic.   Neck: supple with no carotid or supraclavicular bruits Cardiovascular: regular rate and rhythm, no  murmurs Musculoskeletal: no deformity Skin:  no rash/petichiae Vascular:  Normal pulses all extremities  Neurologic Exam Mental Status: Awake and fully alert.  Fluent speech and language.  Oriented to place and time. Recent memory subjectively impaired and remote memory intact. Attention span, concentration and fund of knowledge appropriate during visit. Mood and affect appropriate.  Cranial Nerves: Pupils equal, briskly reactive to light.  Funduscopic exam not done extraocular movements full without nystagmus. Visual fields full to confrontation. Hearing intact. Facial sensation intact.  Face, tongue, palate moves normally and symmetrically.  Motor: Normal bulk and tone. Normal strength in all tested extremity muscles. Sensory.: intact to touch , pinprick , position and vibratory sensation.  Coordination: Rapid alternating movements normal in all extremities. Finger-to-nose and heel-to-shin performed accurately bilaterally. Mild L>R postural tremor. Negative cogwheel rigidity or bradykinesia Gait and Station: Arises from chair without difficulty. Stance is normal. Gait demonstrates normal stride, and balance without use of assistive device.  Difficulty performing heel, toe and tandem walk Reflexes: 1+ and symmetric. Toes downgoing.     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 Vascular risk factors of pre-DM, hypertension, hyperlipidemia, intracranial stenosis and obesity. 6 month onset of lower extremity tremors upon standing which seem like orthostatic tremors which fortunately appears to have resolved.    I had a long d/w patient and her husband about  her  remote stroke, risk for recurrent stroke/TIAs, personally independently reviewed imaging studies and stroke evaluation results and answered questions.Continue aspirin 81 mg daily  for  secondary stroke prevention and maintain strict control of hypertension with blood pressure goal below 130/90, diabetes with hemoglobin A1c goal below 6.5% and lipids with LDL cholesterol goal below 70 mg/dL. I also advised the patient to eat a healthy diet with plenty of whole grains, cereals, fruits and vegetables, exercise regularly and maintain ideal body weight .patient's lower extremity orthostatic tremors fortunately appear to have gone away and she will not pursue further work-up for the same.  She was encouraged to continue participation in cognitively challenging activities like solving crossword puzzles, playing bridge and sodoku to help with the mild cognitive impairment which appears to be stable.  Followup in the future with my nurse practitioner Shanda Bumps in 6 months or call earlier if necessary.  Greater than 50% time during the 30-minute follow-up visit was spent on counseling and coordination of care about her orthostatic tremors as well as remote stroke and discussion of stroke prevention and treatment answering questions.  Delia Heady, MD  [facility] [address] [place], [place] [address]  Phone [phone] Fax [phone] Note: This document was prepared with digital dictation and possible smart phrase technology. Any transcriptional errors that result from this process are unintentional.

## 2021-06-08 NOTE — Patient Instructions (Addendum)
I had a long d/w patient and her husband about  her  remote stroke, risk for recurrent stroke/TIAs, personally independently reviewed imaging studies and stroke evaluation results and answered questions.Continue aspirin 81 mg daily  for secondary stroke prevention and maintain strict control of hypertension with blood pressure goal below 130/90, diabetes with hemoglobin A1c goal below 6.5% and lipids with LDL cholesterol goal below 70 mg/dL. I also advised the patient to eat a healthy diet with plenty of whole grains, cereals, fruits and vegetables, exercise regularly and maintain ideal body weight .patient's lower extremity orthostatic tremors fortunately appear to have gone away and she will not pursue further work-up for the same.  She was encouraged to continue participation in cognitively challenging activities like solving crossword puzzles, playing bridge and sodoku to help with the mild cognitive impairment which appears to be stable.  Followup in the future with my nurse practitioner Shanda Bumps in 6 months or call earlier if necessary.

## 2021-06-09 ENCOUNTER — Telehealth: Payer: Self-pay | Admitting: Neurology

## 2021-06-09 NOTE — Telephone Encounter (Signed)
Medicare/bcbs fed no Berkley Harvey req- sent Marie Walker a message she will reach out to the patient to schedule.

## 2021-06-15 ENCOUNTER — Other Ambulatory Visit: Payer: Self-pay

## 2021-06-15 ENCOUNTER — Ambulatory Visit (HOSPITAL_COMMUNITY)
Admission: RE | Admit: 2021-06-15 | Discharge: 2021-06-15 | Disposition: A | Discharge status: Home / Self Care | Payer: Medicare Other | Source: Ambulatory Visit | Attending: Neurology | Admitting: Neurology

## 2021-06-15 DIAGNOSIS — I6990 Unspecified sequelae of unspecified cerebrovascular disease: Secondary | ICD-10-CM | POA: Insufficient documentation

## 2021-06-15 NOTE — Progress Notes (Signed)
Carotid artery duplex completed. Refer to "CV Proc" under chart review to view preliminary results.  06/15/2021 1:29 PM Eula Fried., MHA, RVT, RDCS, RDMS

## 2021-09-25 ENCOUNTER — Emergency Department: Payer: Medicare Other

## 2021-09-25 ENCOUNTER — Emergency Department
Admission: EM | Admit: 2021-09-25 | Discharge: 2021-09-25 | Disposition: A | Discharge status: Home / Self Care | Payer: Medicare Other | Attending: Emergency Medicine | Admitting: Emergency Medicine

## 2021-09-25 DIAGNOSIS — M7989 Other specified soft tissue disorders: Secondary | ICD-10-CM | POA: Insufficient documentation

## 2021-09-25 HISTORY — DX: Essential (primary) hypertension: I10

## 2021-09-25 HISTORY — DX: Gastro-esophageal reflux disease without esophagitis: K21.9

## 2021-09-25 HISTORY — DX: Gout, unspecified: M10.9

## 2021-09-25 NOTE — ED Triage Notes (Signed)
Pt presents to the ED with left leg swelling and redness since Thursday. Pt concerned for DVT. Hall bed ok.

## 2021-09-25 NOTE — Discharge Instructions (Addendum)
Dear Rachel Diaz,    Thank you for choosing the [facility] Emergency Department for your healthcare needs.  We hope your visit today was EXCELLENT.    -Please follow up with your PCP to ensure symptoms improving.    -If new fevers such as knee pain, fevers, chills occur, please be seen by a provider.   -Return to the emergency department for any new or worsening symptoms.    If you have any questions or concerns, I am available at [phone]. Please do not hesitate to contact me if I can be of assistance.     Below is some information and resources that our patients often find helpful.    Sincerely,    Deborah Chalk, Physician Assistant  [facility] - Department of Emergency Medicine    ________________________________________________________________      Thank you for choosing [facility] for your emergency care needs.  We strive to provide EXCELLENT care to you and your family.      DOCTOR REFERRALS  Call [phone] if you need any further referrals and we can help you find a primary care doctor or specialist.  Also, available online at:  [email or web address]    YOUR CONTACT INFORMATION  Before leaving please check with registration to make sure we have an up-to-date contact number.  You can call registration at [phone] to update your information.  For questions about your hospital bill, please call [phone].  For questions about your Emergency Dept Physician bill please call [phone].      FREE HEALTH SERVICES  If you need help with health or social services, please call 2-1-1 for a free referral to resources in your area.  2-1-1 is a free service connecting people with information on health insurance, free clinics, pregnancy, mental health, dental care, food assistance, housing, and substance abuse counseling.  Also, available online at:  [email or web address].org    MEDICAL RECORDS AND TESTS  Certain laboratory test results do not  come back the same day, for example urine cultures.  We will contact you if other important findings are noted.  Radiology films are often reviewed again to ensure accuracy.  If there is any discrepancy, we will notify you.      Please call [phone] to pick up a complimentary CD of any radiology studies performed.  If you or your doctor would like to request a copy of your medical records, please call [phone].      ORTHOPEDIC INJURY   Please know that significant injuries can exist even when an initial x-ray is read as normal or negative.  This can occur because some fractures (broken bones) are not initially visible on x-rays.  For this reason, close outpatient follow-up with your primary care doctor or bone specialist (orthopedist) is required.    MEDICATIONS AND FOLLOWUP  Please be aware that some prescription medications can cause drowsiness.  Use caution when driving or operating machinery.    The examination and treatment you have received in our Emergency Department is provided on an emergency basis, and is not intended to be a substitute for your primary care physician.  It is important that your doctor checks you again and that you report any new or remaining problems at that time.      LOCAL PHARMACIES  [facility] - [address], [place], [place] [address] (1.4 miles, 7 minutes)  [facility] - [address], [place], [place] [address] (6.5 miles, 13 minutes)  Handout with directions available on request    PATIENT RELATIONS  If you have any concerns, issues, or feedback related to your care, positive or negative, please do not hesitate to contact Patient Relations at [phone]. They are open from 8:30AM-5:00PM Monday through Friday.

## 2021-09-25 NOTE — ED Provider Notes (Signed)
[facility]       [facility] History and Physical Exam     Patient Name: Rachel Diaz, Rachel Diaz  Encounter Date:  09/25/2021  Attending Physician: Wilmer Floor, DO  Patient DOB:  1951/02/21  MRN:  [id number]  Room:  H 5/H 5    Chief Complaint     Chief Complaint   Patient presents with    Leg Swelling     History of Presenting Illness     71 y.o. female presents with nontraumatic L leg swelling, redness onset Thursday.  Pt recently returned from [place], approximately 5.5h drive total.  She has had prolonged relative immobilization in the recent weeks following a COVID infection with slow recovery.  Denies chest pain, dyspnea  She also reports some redness that comes and goes, is not tender, just below the L knee.  Denies fevers, chills  No personal hx of VTE  Not anticoagulated        PMD:  Sheran Luz, MD    Nursing Notes Review  Nursing Notes were reviewed.     Previous Records Review  Previous records were reviewed to the extent practicable for the current presentation.          Review of Systems   Constitutional:  Negative for chills and fever.   HENT:  Negative for congestion and nosebleeds.    Eyes:  Negative for blurred vision and double vision.   Respiratory:  Negative for cough, sputum production and shortness of breath.    Cardiovascular:  Positive for leg swelling (LLE). Negative for chest pain.   Gastrointestinal:  Negative for nausea and vomiting.   Skin:  Negative for itching.        Redness of the L proximal anterior lower leg     Neurological:  Negative for dizziness and headaches.   Endo/Heme/Allergies:  Negative for environmental allergies. Does not bruise/bleed easily.   All other systems reviewed and are negative.       Physical Exam     Vital Signs  BP (!) 160/114   Pulse 82   Temp 98.1 F (36.7 C) (Oral)   Resp 18   Wt 86.4 kg   SpO2 98%   BMI 34.84 kg/m     Review of Vital Signs  The patient's vital signs and oxygen saturation were reviewed and  interpreted by me, Wilmer Floor, DO.    Physical Exam    Constitutional: No acute distress. Not ashen. Nontoxic. Well-nourished, well-hydrated.  Eyes: No conjunctival discharge. EOMI. PERRLA. No scleral icterus. No nystagmus.  Head, Ears, Nose, Mouth, Throat: Normocephalic. Moist mucous membranes. No cyanosis.   Neck: Full painless range of motion. No obvious neck deformities. No tracheal deviation.   Cardiovascular: No obvious friction rubs or gallops. Normal capillary refill. 2+ radial, DP pulses B/L.   Respiratory/Chest: No obvious chest wall asymmetry. No resp distress.  Lungs CTA B/L.   Gastrointestinal/Abdominal:  Soft abdomen. No abdominal distention. NTTP. No rebound, no guarding. No palpable masses.  Musculoskeletal: Normal ROM. No focal extremity tenderness.   B/L healed knee scars c/w prior arthroplasties.  Some mild swelling of the L lower leg compared to the R lower leg with localized TTP proximal L posterior calf and some TTP along the pretibial soft tissues on the left.  Back: No deformity. No significant scoliosis.   Neurological: AAOx4. No acute focal deficits. CNs intact as tested. Gross sensory exam symmetric B/L UEs, LEs. No focal motor weakness.  Skin:  Warm skin. No acute rash. No diaphoresis.  Pt points to an area of pretibial swelling on the L side that she says has been turning red to varying degrees ad intermittently - on exam the area if normal color, no redness, not warm to the touch, NTTP          MDM  and Plan     MDM  Medical Decision Making  55F with nontraumatic LLE swelling with pain in calf in the setting of recent relative immobility and travel, area of concern with varying severity and intermittent redness of the L pretibial area on the left as well.  On exam she has no findings of infection or cellulitis, skin changes not appreciated at this time - doubt infection as the symptoms are coming and going and if anything seem to be improving with elevation of the limb, possibly  vascular congestion or edema leading to hyperemia.  LLE swelling and pain on palpation of the L calf evaluated with concern for DVT but found on doppler study to be negative for DVT. Given risk factors and location of sx, advised to seek repeat evaluation and possible repeat duplex study on the LLE if sx do not improve or worsen    Problems Addressed:  Left leg swelling: acute illness or injury    Amount and/or Complexity of Data Reviewed  Independent Historian: spouse     Details: husband  Radiology: ordered and independent interpretation performed. Decision-making details documented in ED Course.     Details: LLE venous duplex negative for DVT         Plan:  No orders of the defined types were placed in this encounter.         ED Course, Monitors, EKG, Critical Care, Splints, Consults, Re-evaluation, etc         ED Course:  Pt and husband updated on [place] results - NO DVT IDENTIFIED  Advised to monitor her sx, f/u with PMD or return for persistent or worsening sx as she may need repeat US  Recommended elevation, NSAIDs PRN      Return Precautions    The patient is aware that this evaluation is only a screening for emergent conditions related to his or her symptoms and presentation.   I discussed the need for prompt follow-up.  Patient demonstrates verbal understanding.  Patient advised to return to the ED for any worsening symptoms, uncontrolled pain if applicable, worsening fevers, or any changes in their condition prompting concern and need for repeat evaluation and/or additional management.          Past Medical History     Past Medical History:   Diagnosis Date    Asthma     Hyperlipidemia        Medications     No current facility-administered medications for this encounter.    Current Outpatient Medications:     amLODIPine (NORVASC) 10 MG tablet, Take 1 tablet (10 mg total) by mouth daily, Disp: 30 tablet, Rfl: 1    amphetamine-dextroamphetamine (ADDERALL) 10 MG tablet, Take 10 mg by mouth 2 (two) times daily.,  Disp: , Rfl:     aspirin 81 MG chewable tablet, Chew 1 tablet (81 mg total) by mouth daily, Disp: 30 tablet, Rfl: 1    atorvastatin (LIPITOR) 40 MG tablet, Take 1 tablet (40 mg total) by mouth nightly, Disp: 30 tablet, Rfl: 1    buPROPion XL (WELLBUTRIN XL) 300 MG 24 hr tablet, Take 300 mg by mouth daily, Disp: ,  Rfl:     clopidogrel (PLAVIX) 75 mg tablet, Take 1 tablet (75 mg total) by mouth daily, Disp: 30 tablet, Rfl: 1    DULoxetine (CYMBALTA) 60 MG capsule, Take 60 mg by mouth daily, Disp: , Rfl:     lisinopril (ZESTRIL) 20 MG tablet, Take 1 tablet (20 mg total) by mouth daily, Disp: 30 tablet, Rfl: 1    Allergies     Allergies   Allergen Reactions    Morphine      Mental changes       Orphenadrine Citrate      Presyncope     Percocet [Oxycodone-Acetaminophen]        Medical history, medications, and allergies reviewed.     Past Surgical History     Past Surgical History:   Procedure Laterality Date    BREAST BIOPSY      TONSILLECTOMY      TOTAL KNEE ARTHROPLASTY  B/L 2009, 2010       Family History   The family history is not significantly contributory to current presentation.   Family History   Problem Relation Age of Onset    Heart failure Mother     Heart failure Father     CABG Father        Social History   Social history is not significantly contributory to the patient's current presentation.   Social History     Occupational History    Not on file   Tobacco Use    Smoking status: Never    Smokeless tobacco: Never   Vaping Use    Vaping status: Never Used   Substance and Sexual Activity    Alcohol use: Yes     Comment: rare    Drug use: No    Sexual activity: Not on file        Review of Systems     See HPI for review of systems that is relevant to the current presentation.   All other systems reviewed: negative.     Diagnostic Study Results     Labs  I have personally and independently reviewed the following lab results.  No results found for this or any previous visit (from the past 24  hour(s)).      Radiologic Studies  [place] VenoDopp Low Extremity Left   Final Result       No sonographic evidence for left lower extremity deep venous thrombosis.      Campbell Stall   09/25/2021 12:15 PM        I have personally and independently reviewed the above imaging and agree with the radiologist interpretation unless otherwise explicitly noted.     Scribe and MD Attestations     Rendering Provider: Wilmer Floor, DO        Diagnosis and Disposition     Clinical Impression  1. Left leg swelling        Disposition  ED Disposition       ED Disposition   Discharge    Condition   --    Date/Time   Sat Sep 25, 2021 12:31 PM    Comment   HORTENCIA MARTIRE discharge to home/self care.    Condition at disposition: Stable                      Discharge Prescriptions        New Prescriptions    No medications on file  Wilmer Floor, DO  09/26/21 [phone]

## 2021-11-15 ENCOUNTER — Other Ambulatory Visit: Payer: Self-pay | Admitting: Family Medicine

## 2021-11-15 DIAGNOSIS — Z1231 Encounter for screening mammogram for malignant neoplasm of breast: Secondary | ICD-10-CM

## 2021-12-01 ENCOUNTER — Encounter: Payer: Self-pay | Admitting: Internal Medicine

## 2021-12-01 ENCOUNTER — Ambulatory Visit (INDEPENDENT_AMBULATORY_CARE_PROVIDER_SITE_OTHER): Payer: Medicare Other | Admitting: Internal Medicine

## 2021-12-01 VITALS — BP 126/68 | HR 90 | Temp 98.1°F | Ht 59.0 in | Wt 185.6 lb

## 2021-12-01 DIAGNOSIS — R053 Chronic cough: Secondary | ICD-10-CM | POA: Diagnosis not present

## 2021-12-01 DIAGNOSIS — J309 Allergic rhinitis, unspecified: Secondary | ICD-10-CM

## 2021-12-01 MED ORDER — CETIRIZINE HCL 10 MG PO TABS: 10.0000 mg | ORAL_TABLET | Freq: Every day | ORAL | 5 refills | Status: DC

## 2021-12-01 MED ORDER — FLUTICASONE PROPIONATE 50 MCG/ACT NA SUSP: 1.0000 | Freq: Every day | NASAL | 5 refills | Status: DC

## 2021-12-01 NOTE — Patient Instructions (Signed)
Please schedule follow up scheduled with myself in 2 months.  If my schedule is not open yet, we will contact you with a reminder closer to that time. Please call [phone] if you haven't heard from [place] a month before.   Start taking zyrtec and singulair daily. Add flonase to this.   Flonase - 1 spray on each side of your nose twice a day for first week, then 1 spray on each side.   Instructions for use: If you also use a saline nasal spray or rinse, use that first. Position the head with the chin slightly tucked. Use the right hand to spray into the left nostril and the right hand to spray into the left nostril.   Point the bottle away from the septum of your nose (cartilage that divides the two sides of your nose).  Hold the nostril closed on the opposite side from where you will spray Spray once and gently sniff to pull the medicine into the higher parts of your nose.  Don't sniff too hard as the medicine will drain down the back of your throat instead. Repeat with a second spray on the same side if prescribed. Repeat on the other side of your nose.

## 2021-12-01 NOTE — Progress Notes (Signed)
Marie Walker    AY:[id number]    [birth year]  Primary Care Physician:Hagler, Apolonio Schneiders, MD  Referring Physician: Caren Macadam, [address],  [place] [address] Reason for Consultation: chronic cough  Date of Consultation: 12/01/2021  Chief complaint:   Chief Complaint  Patient presents with   Consult    Chronic cough yellow or white but usually dry, sob x 2 years     HPI: Marie Walker is a 71 y.o. woman who presents for new patient evaluation of chronic cough for the past 2 years.  Cough is throughout the day but also at night. oes not wake her up at night. Usually a dry cough, occasional yellow mucus. No hemoptysis. Denies chest tightness, wheezing, shortness of breath.   Cough seems to come up without provocation. Not with exertion or rest. Not worse with talking or eating. She thought it could be related to allergies and takes singulair. She can't tell if it has helped. She does get some itchy eyes and cough has gotten worse since she moved to [place] from [place]. She does have post nasal drainage occasionally. She does clear her throat.    She denies reflux or heart burn. She is on pantoprazole 40 mg daily.   She had a stroke two years ago, has also had issues with prolonged gout, hip bursitis, shingles, had covid in February 2023 treated with paxlovid. Since her stroke in march 2021 she has had no residual physical impairments but husband notes some impairment in short term memory. Her memory has gotten worse over the past year. Husband feels being taken off adderall after stroke has impacted.   Never had allergy testing.  No childhood respiratory disease/asthma.  Social history:  Occupation: worked as a Pharmacist, hospital for [employer].  Exposures: used to live in [place], moved to be closer to children and grandchildren. Smoking history: never smoker, had passive smoke exposure in spouse, who quit in 1998.   Social History   Occupational History    Not on file  Tobacco Use   Smoking status: Never   Smokeless tobacco: Never  Substance and Sexual Activity   Alcohol use: Yes    Alcohol/week: 2.0 standard drinks    Types: 1 Glasses of wine, 1 Shots of liquor per week    Comment: ocassionally   Drug use: Not Currently   Sexual activity: Not on file    Relevant family history:  Family History  Problem Relation Age of Onset   Lung disease Neg Hx     Past Medical History:  Diagnosis Date   ADD (attention deficit disorder)    Asthma    Dermatitis    Hyperlipidemia    Hypertension    Stroke [facility])     Past Surgical History:  Procedure Laterality Date   BREAST BIOPSY     REPLACEMENT TOTAL KNEE BILATERAL     right foot fracture      TONSILLECTOMY       Physical Exam: Blood pressure 126/68, pulse 90, temperature 98.1 F (36.7 C), temperature source Temporal, height 4\' 11"  (1.499 m), weight 185 lb 9.6 oz (84.2 kg), SpO2 97 %. Gen:      No acute distress ENT:  +cobblestoning in oropharynx,no nasal polyps, mucus membranes moist Lungs:    No increased respiratory effort, symmetric chest wall excursion, clear to auscultation bilaterally, no wheezes or crackles CV:         Regular rate and  rhythm; no murmurs, rubs, or gallops.  No pedal edema Abd:      + bowel sounds; soft, non-tender; no distension MSK: no acute synovitis of DIP or PIP joints, no mechanics hands.  Skin:      Warm and dry; no rashes Neuro: normal speech, no focal facial asymmetry Psych: alert and oriented x3, normal mood and affect   Data Reviewed/Medical Decision Making:  Independent interpretation of tests: Imaging:  Review of patient's chest xray April 2022 images revealed no acute process. The patient's images have been independently reviewed by me.    PFTs: I have personally reviewed the patient's PFTs and      View : No data to display.          Labs: Labs reviewed From April 2023 from PCP CMP remarkable for elevated blood glucose and  CO2 of 33. CBC wnl.    Immunization status:  Immunization History  Administered Date(s) Administered   PFIZER(Purple Top)SARS-COV-2 Vaccination 09/03/2019, 09/22/2019     I reviewed prior external note(s) from [place] [facility]  I reviewed the result(s) of the labs and imaging as noted above.   I have ordered    Assessment:  Chronic cough, most likely related to chronic allergic rhinitis GERD  Plan/Recommendations:  Continue singulair, will add zyrtec and flonase.  If no improvement would proceed with feno/spiro to evaluate for asthma which seems less likely.  Continue ppi which she is already on, but symptoms seem controlled.   We discussed disease management and progression at length today.    Return to Care: Return in about 2 months (around 01/31/2022).  Lenice Llamas, MD Pulmonary and [place]  CC: Caren Macadam, MD

## 2021-12-09 ENCOUNTER — Ambulatory Visit (INDEPENDENT_AMBULATORY_CARE_PROVIDER_SITE_OTHER): Payer: Medicare Other | Admitting: Adult Health

## 2021-12-09 ENCOUNTER — Encounter: Payer: Self-pay | Admitting: Adult Health

## 2021-12-09 VITALS — BP 155/60 | HR 84 | Ht 60.0 in | Wt 183.0 lb

## 2021-12-09 DIAGNOSIS — I63432 Cerebral infarction due to embolism of left posterior cerebral artery: Secondary | ICD-10-CM | POA: Diagnosis not present

## 2021-12-09 DIAGNOSIS — R4789 Other speech disturbances: Secondary | ICD-10-CM

## 2021-12-09 DIAGNOSIS — R519 Headache, unspecified: Secondary | ICD-10-CM | POA: Diagnosis not present

## 2021-12-09 DIAGNOSIS — R4189 Other symptoms and signs involving cognitive functions and awareness: Secondary | ICD-10-CM

## 2021-12-09 DIAGNOSIS — G3184 Mild cognitive impairment, so stated: Secondary | ICD-10-CM

## 2021-12-09 NOTE — Patient Instructions (Signed)
You will be called to schedule an MRI brain to ensure there has not been any new strokes  Ensure you continue your memory exercises at home   Continue aspirin 81 mg daily  and atorvastatin 80mg  daily  for secondary stroke prevention  Continue to follow up with PCP regarding cholesterol and blood pressure management  Maintain strict control of hypertension with blood pressure goal below 130/90 and cholesterol with LDL cholesterol (bad cholesterol) goal below 70 mg/dL.   Signs of a Stroke? Follow the BEFAST method:  Balance Watch for a sudden loss of balance, trouble with coordination or vertigo Eyes Is there a sudden loss of vision in one or both eyes? Or double vision?  Face: Ask the person to smile. Does one side of the face droop or is it numb?  Arms: Ask the person to raise both arms. Does one arm drift downward? Is there weakness or numbness of a leg? Speech: Ask the person to repeat a simple phrase. Does the speech sound slurred/strange? Is the person confused ? Time: If you observe any of these signs, call 911.     Followup in the future with me in 6 months or call earlier if needed       Thank you for coming to see at [facility]. I hope we have been able to provide you high quality care today.  You may receive a patient satisfaction survey over the next few weeks. We would appreciate your feedback and comments so that we may continue to improve ourselves and the health of our patients.

## 2021-12-09 NOTE — Progress Notes (Signed)
[facility] [address] [place] [phone]       STROKE FOLLOW UP NOTE  Ms. Clifton CustardSharon E Kooyman Date of Birth:  08/27/1950 Medical Record Number:  [id number]    Primary neurologist: Dr. Pearlean BrownieSethi Reason for Referral: Stroke    Chief Complaint  Patient presents with   Follow-up    RM 2 with spouse Fayrene FearingJames Pt is well and stable, no new concerns       HPI:    Update 12/09/2021 JM: Patient returns for follow-up visit after prior visit with Dr. Pearlean BrownieSethi 6 months ago. Accompanied by her husband, Fayrene FearingJames. At visit back in October, she had concerns of lower extremity tremor upon standing with dizziness.  Cervical imaging completed which showed degenerative changes but otherwise unremarkable.  She had follow-up with Dr. Pearlean BrownieSethi 2 months later for further evaluation and fortunately, symptoms had resolved during the interval time.  Thankfully, she has not had any recurrence of these symptoms.  She is concerned regarding cognitive decline since prior visit.  She also notes difficulty with finding correct words at times or delayed word finding difficulties. Reports gradual decline, denies any abrupt worsening. Long term memory intact.  She does have history of ADHD previously on Adderall which was discontinued when she had her stroke, her PCP plans on restarting this.  She questions if untreated ADHD could be contributing to her memory loss.  At times can have issues with insomnia, has difficulty shutting her brain off.  Previously underwent sleep study (3-4 yrs ago) which was negative for apnea per patient (unable to view report).  Reports approximately 4 weeks ago, she was experiencing daily morning headaches that lasted for 1 week, located right side on top of head. Would take additional aspirin with resolution of headache.  Denies any photophobia, phonophobia, N/V or any other neurological symptoms associated.  Does have history of migraines but "many years ago" and these  headaches felt different from her prior migraines. She continues to maintain ADLs and majority of IADLs independently.  Compliant on aspirin and atorvastatin, denies side effects.  Blood pressure today 155/60. Occasionally monitors at home, typically stable 130s/80s.   No further concerns at this time     History provided for reference purposes only Update 06/08/2021 Dr. Pearlean BrownieSethi: She returns for follow-up after last visit with Shanda BumpsJessica nurse practitioner on 04/19/2021.  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  Patient however states that since that visit the tremor has practically disappeared in a few weeks after that visit.  She has had no tremors in the last several months.  She states her short-term memory difficulties are about unchanged.  Memory loss is not progressive.  She does do regular cognitive exercises on the app [email or web address].  She also play some games on her phone.  She has had no recurrent stroke or TIA symptoms since her original stroke 2 years ago.  She remains on aspirin which is tolerating well without bleeding or bruising.  Her blood pressure is under good control today it is 131/84.  She is tolerating Lipitor well without muscle aches and pains.  She has no new complaints today.  She has upcoming annual physical exam due with primary care physician and will have follow-up lab work done at that visit.    Update 04/19/2021 JM: Returns for 2362-month stroke follow-up accompanied by her husband, Fayrene FearingJames.  Overall stable without new stroke/TIA symptoms.  Reports  continued short-term memory difficulties stable without worsening.  Maintains ADLs and IADLs independently.  Compliant on aspirin and atorvastatin without side effects.  Blood pressure today 138/87.   Reports over the past 6 months, she has experienced 2-3 episodes of right leg tremor while standing associated with dizziness sensation. Typically  resolves after sitting or taking pressure off from leg. Reports approx 1 month ago, she experienced dizziness sensation upon standing and after walking short distance (into kitchen) her legs started shaking bilaterally and then progressed to bilateral upper extremity tremor with difficulty holding on to a cup that was in her hands. This lasted approx 2-3 minutes. This led to a fall but thankfully without injury or hitting her head. She has not had any additional episodes since that time. She is scheduled in December with Dr. Pearlean Brownie for further evaluation  Routinely followed by [facility] for chronic low back pain - completed imaging a few months ago for worsening back pain - unable to view via epic. Mild neck pain/tightness but denies radiculopathy.  Recently diagnosed with shingles 5-6 weeks ago - residual nerve pain left side waist to shoulder - currently on gabapentin 300 mg 3 times daily -recent episode occurred prior to starting gabapentin.    Update 10/20/2020 JM: Mrs. Bridgeforth returns for 24-month stroke follow-up accompanied by her husband.  Doing well since prior visit without new or reoccurring stroke/TIA symptoms.  She does report continued short-term memory issues but has been stable without worsening.  Continues to maintain ADLs and IADLs independently and routinely doing memory exercises.  Denies depression or anxiety.  Compliant on aspirin and atorvastatin without associated side effects.  Blood pressure today 116/73. Reports lab work by PCP completed in December with level satisfactory.  Routinely followed by [facility] for lower back and hip pain.  Reports recent left arm spasm upon reaching overhead only lasting for a couple seconds without any reoccurring symptoms.  Denies shoulder or arm pain but does endorse occasional neck tightness sensation.  No further concerns at this time  Update 04/21/2020 JM: Ms. Unrein returns for stroke follow-up accompanied by her husband. Stable since prior visit  without residual deficits and denies new or reoccurring stroke/TIA symptoms. She does report some short term memory loss which has been present since her stroke but also endorses multiple other stressors that may be contributing.  She will occasionally forget recent conversations, repeat a question or forget what she wants on television.  She is able to maintain ADLs and IADLs independently.  Short-term memory concerns have been stable without worsening and does do daily memory exercises such as crossword puzzles, sudoku and exercises on Lumosity.  Remains on aspirin and atorvastatin 60 mg daily for secondary stroke prevention. She did have cholesterol levels checked by PCP around July or August and plans on repeating in December at yearly physical. Blood pressure today 130/82.  She does not routinely monitor at home.  No further concerns at this time.  Initial visit 12/26/2019 Dr. Pearlean Brownie: Ms. Bogle is a pleasant 71 year old Caucasian lady is seen today for initial office consultation visit for stroke.  She is accompanied by her husband.  History is obtained from them as well as review of referral notes.  No actual imaging films available in PACS for review today.  She has past medical history of hypertension hyperlipidemia obesity and gout who developed sudden onset of right upper extremity paresthesias from elbow down as well as some gait ataxia and stumbling on 09/11/2019.  She was admitted to [facility] [place].  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  MR angiogram of the neck was unremarkable hemoglobin A1c was normal at 5.3 and LDL was elevated at 161 mg percent.  Echocardiogram showed normal ejection  fraction of 65%.  She was started on aspirin Plavix and a statin.  She is out of follow-up lipid profile checked by primary physician in 11/04/2019 which had improved but was still elevated 84 mg percent.  Her dose of Lipitor was however not increased she is on aspirin Plavix and tolerating fairly well but does complain of increased bruising.  She underwent 2-week Holter monitor placed by outpatient cardiologist at [facility] which showed no evidence of significant cardiac arrhythmias.  She denies any history of syncope palpitations.  She states numbness and gait ataxia completely resolved within a day after admission.  She has had no recurrent stroke or TIA symptoms.  She states she is doing well and has no complaints today.  She has no prior history of strokes TIAs seizures or significant neurological problems.  No family history of strokes.   ROS:   14 system review of systems is positive for those listed in HPI and all other systems negative  PMH:  Past Medical History:  Diagnosis Date   ADD (attention deficit disorder)    Asthma    Dermatitis    Hyperlipidemia    Hypertension    Stroke [facility])     Social History:  Social History   Socioeconomic History   Marital status: Married    Spouse name: Not on file   Number of children: Not on file   Years of education: Not on file   Highest education level: Not on file  Occupational History   Not on file  Tobacco Use   Smoking status: Never   Smokeless tobacco: Never  Substance and Sexual Activity   Alcohol use: Yes    Alcohol/week: 2.0 standard drinks of alcohol    Types: 1 Glasses of wine, 1 Shots of liquor per week    Comment: ocassionally   Drug use: Not Currently   Sexual activity: Not on file  Other Topics Concern   Not on file  Social History Narrative   Not on file   Social Determinants of Health   Financial Resource Strain: Not on file  Food Insecurity: Not on file  Transportation Needs: Not on file  Physical Activity:  Not on file  Stress: Not on file  Social Connections: Not on file  Intimate Partner Violence: Not on file    Medications:   Current Outpatient Medications on File Prior to Visit  Medication Sig Dispense Refill   albuterol (VENTOLIN HFA) 108 (90 Base) MCG/ACT inhaler every 4 (four) hours as needed.      allopurinol (ZYLOPRIM) 100 MG tablet 1/2 tablet     amLODipine (NORVASC) 10 MG tablet amlodipine 10 mg tablet     aspirin 81 MG EC tablet Take by mouth once. Tablet     atorvastatin (LIPITOR) 80 MG tablet Take 1 tablet (80 mg total) by mouth daily. 90 tablet 3   B COMPLEX VITAMINS ER PO Take by mouth.     BIOTIN PO Take by mouth.  cetirizine (ZYRTEC) 10 MG tablet Take 1 tablet (10 mg total) by mouth daily. 30 tablet 5   fluticasone (FLONASE) 50 MCG/ACT nasal spray Place 1 spray into both nostrils daily. 16 g 5   hydrOXYzine (ATARAX/VISTARIL) 25 MG tablet Take 25 mg by mouth as needed.      montelukast (SINGULAIR) 10 MG tablet Take by mouth at bedtime.      pantoprazole (PROTONIX) 40 MG tablet Take 40 mg by mouth daily.     No current facility-administered medications on file prior to visit.    Allergies:   Allergies  Allergen Reactions   Morphine Other (See Comments)    Adverse reaction   Orphenadrine Other (See Comments)    Makes BP drop    Oxycodone-Acetaminophen Other (See Comments)   Augmentin [Amoxicillin-Pot Clavulanate] Nausea And Vomiting   Doxycycline Nausea And Vomiting   Orphenadrine Citrate Swelling   Other      Physical Exam Today's Vitals   12/09/21 1037  BP: (!) 155/60  Pulse: 84  Weight: 183 lb (83 kg)  Height: 5' (1.524 m)   Body mass index is 35.74 kg/m.   General: Obese very pleasant elderly Caucasian lady seated, in no evident distress Head: head normocephalic and atraumatic.   Neck: supple with no carotid or supraclavicular bruits Cardiovascular: regular rate and rhythm, no murmurs Musculoskeletal: no deformity Skin:  no  rash/petichiae Vascular:  Normal pulses all extremities  Neurologic Exam Mental Status: Awake and fully alert.  Fluent speech and language.  Unable to appreciate aphasia during today's visit.  Oriented to place and time. Recent memory impaired and remote memory intact. Attention span, concentration and fund of knowledge appropriate during visit. Mood and affect appropriate.     12/09/2021   11:23 AM  MMSE - Mini Mental State Exam  Orientation to time 4  Orientation to Place 5  Registration 3  Attention/ Calculation 5  Recall 3  Language- name 2 objects 2  Language- repeat 1  Language- follow 3 step command 2  Language- read & follow direction 1  Write a sentence 1  Copy design 1  Total score 28   Cranial Nerves: Pupils equal, briskly reactive to light. Extraocular movements full without nystagmus. Visual fields full to confrontation. Hearing intact. Facial sensation intact. Face, tongue, palate moves normally and symmetrically.  Motor: Normal bulk and tone. Normal strength in all tested extremity muscles. Sensory.: intact to touch , pinprick , position and vibratory sensation.  Coordination: Rapid alternating movements normal in all extremities. Finger-to-nose and heel-to-shin performed accurately bilaterally.  Gait and Station: Arises from chair without difficulty. Stance is normal. Gait demonstrates normal stride, and balance without use of assistive device.  Difficulty performing heel, toe and tandem walk Reflexes: 1+ and symmetric. Toes downgoing.     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 Vascular risk factors of pre-DM, hypertension, hyperlipidemia, intracranial stenosis and obesity. Back in 03/2021, reported 6 month onset of 2-3 episodes of intermittent RLE tremor while standing and in 02/2021, experienced BLE tremor which  progressed to BUE tremor leading a fall lasting 2-3 minutes. There eventually resolved on their own.  At today's visit, reports cognitive decline, occasional word finding difficulty and headaches.    1. L PCA stroke -Continue aspirin 81 mg daily and atorvastatin 80mg  daily for secondary stroke prevention  -LDL 74 (10/2021)  -A1c 6.0 (08/2020) -Discussed  secondary stroke prevention measures and importance of close PCP follow-up to maintain strict control of hypertension with blood pressure goal below 130/90, prediabetes with A1c goal<7 and lipids with LDL cholesterol goal below 70 mg/dL.   2.  New onset of headaches 3.  Transient aphasia 4.  Mild cognitive impairment with decline -Obtain MRI brain to rule out underlying etiologies of new symptoms although low suspicion for new stroke. Suspect word finding difficulty in setting of cognitive decline possibly in setting of multiple other health related issues, ADHD (PCP plans on restarting Adderall) and likely underlying anxiety -Has not had any additional headaches -no red flag symptoms and neuro exam intact - advised to call if they should reoccur  -MMSE 28/30 -Dementia panel WNL -Discussed continuing memory exercises and discussed importance of managing stroke risk factors, routine exercise, healthy diet and adequate sleep  5. Tremor of unknown etiology -Resolved on own     Follow-up in 6 months or call earlier if needed    CC:  [facility] provider: Dr. Leonette Most, Fleet Contras, MD    I spent 38 minutes of face-to-face and non-face-to-face time with patient and husband.  This included previsit chart review, lab review, study review, order entry, electronic health record documentation, patient and husband education and discussion regarding history of prior stroke, importance of managing stroke risk factors and secondary stroke prevention measures, new concerns as mentioned above as well as review of MMSE and answered all other questions to patient  and husband satisfaction  Ihor Austin, AGNP-BC  [facility] [address] [place], [place] [address]  Phone [phone] Fax [phone] Note: This document was prepared with digital dictation and possible smart phrase technology. Any transcriptional errors that result from this process are unintentional.

## 2021-12-22 ENCOUNTER — Ambulatory Visit
Admission: RE | Admit: 2021-12-22 | Discharge: 2021-12-22 | Disposition: A | Discharge status: Home / Self Care | Payer: Medicare Other | Source: Ambulatory Visit | Attending: Adult Health | Admitting: Adult Health

## 2021-12-22 DIAGNOSIS — R4789 Other speech disturbances: Secondary | ICD-10-CM | POA: Diagnosis not present

## 2021-12-22 DIAGNOSIS — R519 Headache, unspecified: Secondary | ICD-10-CM | POA: Diagnosis not present

## 2021-12-29 ENCOUNTER — Ambulatory Visit
Admission: RE | Admit: 2021-12-29 | Discharge: 2021-12-29 | Disposition: A | Discharge status: Home / Self Care | Payer: Medicare Other | Source: Ambulatory Visit | Attending: Family Medicine | Admitting: Family Medicine

## 2021-12-29 DIAGNOSIS — Z1231 Encounter for screening mammogram for malignant neoplasm of breast: Secondary | ICD-10-CM

## 2021-12-31 ENCOUNTER — Other Ambulatory Visit: Payer: Medicare Other

## 2022-01-06 ENCOUNTER — Other Ambulatory Visit: Payer: Self-pay | Admitting: Family Medicine

## 2022-01-06 DIAGNOSIS — R140 Abdominal distension (gaseous): Secondary | ICD-10-CM

## 2022-01-06 DIAGNOSIS — Z1273 Encounter for screening for malignant neoplasm of ovary: Secondary | ICD-10-CM

## 2022-01-13 ENCOUNTER — Ambulatory Visit
Admission: RE | Admit: 2022-01-13 | Discharge: 2022-01-13 | Disposition: A | Discharge status: Home / Self Care | Payer: Medicare Other | Source: Ambulatory Visit | Attending: Family Medicine | Admitting: Family Medicine

## 2022-01-13 DIAGNOSIS — Z1273 Encounter for screening for malignant neoplasm of ovary: Secondary | ICD-10-CM

## 2022-01-13 DIAGNOSIS — R140 Abdominal distension (gaseous): Secondary | ICD-10-CM

## 2022-01-31 ENCOUNTER — Ambulatory Visit (INDEPENDENT_AMBULATORY_CARE_PROVIDER_SITE_OTHER): Payer: Medicare Other | Admitting: Internal Medicine

## 2022-01-31 ENCOUNTER — Encounter: Payer: Self-pay | Admitting: Internal Medicine

## 2022-01-31 VITALS — BP 108/60 | HR 91 | Temp 97.9°F | Ht 62.0 in | Wt 180.6 lb

## 2022-01-31 DIAGNOSIS — K219 Gastro-esophageal reflux disease without esophagitis: Secondary | ICD-10-CM

## 2022-01-31 DIAGNOSIS — R053 Chronic cough: Secondary | ICD-10-CM

## 2022-01-31 DIAGNOSIS — J309 Allergic rhinitis, unspecified: Secondary | ICD-10-CM

## 2022-01-31 MED ORDER — FLUTICASONE PROPIONATE 50 MCG/ACT NA SUSP: 1.0000 | Freq: Every day | NASAL | 11 refills | Status: AC

## 2022-01-31 MED ORDER — CETIRIZINE HCL 10 MG PO TABS: 10.0000 mg | ORAL_TABLET | Freq: Every day | ORAL | 11 refills | Status: DC

## 2022-01-31 MED ORDER — MONTELUKAST SODIUM 10 MG PO TABS: 10.0000 mg | ORAL_TABLET | Freq: Every day | ORAL | 11 refills | Status: DC

## 2022-01-31 NOTE — Progress Notes (Signed)
         Marie Walker    [id number]    October 07, 1950  Primary Care Physician:Hagler, Fleet Contras, MD Date of Appointment: 01/31/2022 Established Patient Visit  Chief complaint:   Chief Complaint  Patient presents with   Follow-up    Follow-up: cough, SOB     HPI: Marie Walker  is a 71 y.o. woman with history of L PCA stroke and headaches who presents for chronic cough and shortness of breath.   Interval Updates: Here for follow up felt to be secondary to rhinitis. At last visit started singulair, cetirizine and flonase.  Husband says cough has improved modestly.   She feels like she can do everything she wants to do in the house and goes to the gym three times a week walking 1/2 mile on the treadmill.   She also takes protonix for reflux and has a hoarse voice.   I have reviewed the patient's family social and past medical history and updated as appropriate.   Past Medical History:  Diagnosis Date   ADD (attention deficit disorder)    Asthma    Dermatitis    Hyperlipidemia    Hypertension    Stroke [facility])     Past Surgical History:  Procedure Laterality Date   BREAST BIOPSY     REPLACEMENT TOTAL KNEE BILATERAL     right foot fracture      TONSILLECTOMY      Family History  Problem Relation Age of Onset   Lung disease Neg Hx     Social History   Occupational History   Not on file  Tobacco Use   Smoking status: Never   Smokeless tobacco: Never  Substance and Sexual Activity   Alcohol use: Yes    Alcohol/week: 2.0 standard drinks of alcohol    Types: 1 Glasses of wine, 1 Shots of liquor per week    Comment: ocassionally   Drug use: Not Currently   Sexual activity: Not on file     Physical Exam: Blood pressure 108/60, pulse 91, temperature 97.9 F (36.6 C), temperature source Oral, height 5\' 2"  (1.575 m), weight 180 lb 9.6 oz (81.9 kg), SpO2 97 %.  Gen:      No acute distress ENT:  no nasal polyps, mucus membranes moist Lungs:    No increased respiratory  effort, symmetric chest wall excursion, clear to auscultation bilaterally, no wheezes or crackles CV:         Regular rate and rhythm; no murmurs, rubs, or gallops.  No pedal edema   Data Reviewed: Imaging: I have personally reviewed the chest xray Sept 2022 - no acute process.   PFTs:    Labs:  Immunization status: Immunization History  Administered Date(s) Administered   PFIZER(Purple Top)SARS-COV-2 Vaccination 09/03/2019, 09/22/2019   PNEUMOCOCCAL CONJUGATE-20 11/10/2021    External Records Personally Reviewed: neurology  Assessment:  Chronic cough improved Chronic rhinitis GERD  Plan/Recommendations:  I suspect your cough and voice hoarseness and multifactorial from this and reflux.   Continue flonase, cetirizine and montelukast for your rhinitis symptoms causing nasal drainage.  Keep taking acid reflux medication.   GERD lifestyle modification reviewed.    Return to Care: Return in about 1 year (around 02/01/2023).   04/03/2023, MD Pulmonary and Critical Care Medicine [facility] [phone]

## 2022-01-31 NOTE — Patient Instructions (Addendum)
Please schedule follow up scheduled with myself in 1 year.  If my schedule is not open yet, we will contact you with a reminder closer to that time. Please call [phone] if you haven't heard from [place] a month before.   Continue flonase, cetirizine and montelukast for your rhinitis symptoms causing nasal drainage.  I suspect your cough and voice hoarseness and multifactorial from this and reflux.  Keep taking acid reflux medication.   What is GERD? Gastroesophageal reflux disease (GERD) is gastroesophageal reflux diseasewhich occurs when the lower esophageal sphincter (LES) opens spontaneously, for varying periods of time, or does not close properly and stomach contents rise up into the esophagus. GER is also called acid reflux or acid regurgitation, because digestive juices--called acids--rise up with the food. The esophagus is the tube that carries food from the mouth to the stomach. The LES is a ring of muscle at the bottom of the esophagus that acts like a valve between the esophagus and stomach.  When acid reflux occurs, food or fluid can be tasted in the back of the mouth. When refluxed stomach acid touches the lining of the esophagus it may cause a burning sensation in the chest or throat called heartburn or acid indigestion. Occasional reflux is common. Persistent reflux that occurs more than twice a week is considered GERD, and it can eventually lead to more serious health problems. People of all ages can have GERD. Studies have shown that GERD may worsen or contribute to asthma, chronic cough, and pulmonary fibrosis.   What are the symptoms of GERD? The main symptom of GERD in adults is frequent heartburn, also called acid indigestion--burning-type pain in the lower part of the mid-chest, behind the breast bone, and in the mid-abdomen.  Not all reflux is acidic in nature, and many patients don't have heart burn at all. Sometimes it feels like a cough (either dry or with mucus), choking  sensation, asthma, shortness of breath, waking up at night, frequent throat clearing, or trouble swallowing.    What causes GERD? The reason some people develop GERD is still unclear. However, research shows that in people with GERD, the LES relaxes while the rest of the esophagus is working. Anatomical abnormalities such as a hiatal hernia may also contribute to GERD. A hiatal hernia occurs when the upper part of the stomach and the LES move above the diaphragm, the muscle wall that separates the stomach from the chest. Normally, the diaphragm helps the LES keep acid from rising up into the esophagus. When a hiatal hernia is present, acid reflux can occur more easily. A hiatal hernia can occur in people of any age and is most often a normal finding in otherwise healthy people over age 65. Most of the time, a hiatal hernia produces no symptoms.   Other factors that may contribute to GERD include - Obesity or recent weight gain - Pregnancy  - Smoking  - Diet - Certain medications  Common foods that can worsen reflux symptoms include: - carbonated beverages - artificial sweeteners - citrus fruits  - chocolate  - drinks with caffeine or alcohol  - fatty and fried foods  - garlic and onions  - mint flavorings  - spicy foods  - tomato-based foods, like spaghetti sauce, salsa, chili, and pizza   Lifestyle Changes If you smoke, stop.  Avoid foods and beverages that worsen symptoms (see above.) Lose weight if needed.  Eat small, frequent meals.  Wear loose-fitting clothes.  Avoid lying down for  3 hours after a meal.  Raise the head of your bed 6 to 8 inches by securing wood blocks under the bedposts. Just using extra pillows will not help, but using a wedge-shaped pillow may be helpful.  Medications  H2 blockers, such as cimetidine (Tagamet HB), famotidine (Pepcid AC), nizatidine (Axid AR), and ranitidine (Zantac 75), decrease acid production. They are available in prescription strength  and over-the-counter strength. These drugs provide short-term relief and are effective for about half of those who have GERD symptoms.  Proton pump inhibitors include omeprazole (Prilosec, Zegerid), lansoprazole (Prevacid), pantoprazole (Protonix), rabeprazole (Aciphex), and esomeprazole (Nexium), which are available by prescription. Prilosec is also available in over-the-counter strength. Proton pump inhibitors are more effective than H2 blockers and can relieve symptoms and heal the esophageal lining in almost everyone who has GERD.  Because drugs work in different ways, combinations of medications may help control symptoms. People who get heartburn after eating may take both antacids and H2 blockers. The antacids work first to neutralize the acid in the stomach, and then the H2 blockers act on acid production. By the time the antacid stops working, the H2 blocker will have stopped acid production. Your health care provider is the best source of information about how to use medications for GERD.   Points to Remember 1. You can have GERD without having heartburn. Your symptoms could include a dry cough, asthma symptoms, or trouble swallowing.  2. Taking medications daily as prescribed is important in controlling you symptoms.  Sometimes it can take up to 8 weeks to fully achieve the effects of the medications prescribed.  3. Coughing related to GERD can be difficult to treat and is very frustrating!  However, it is important to stick with these medications and lifestyle modifications before pursuing more aggressive or invasive test and treatments.

## 2022-02-06 ENCOUNTER — Other Ambulatory Visit: Payer: Self-pay | Admitting: Internal Medicine

## 2022-02-06 DIAGNOSIS — M109 Gout, unspecified: Secondary | ICD-10-CM

## 2022-06-07 NOTE — Progress Notes (Deleted)
[facility] [address] [place]. [place] [id number] [phone]       STROKE FOLLOW UP NOTE  Ms. Marie Walker Date of Birth:  [birth year][address]1952 Medical Record Number:  [id number]    Primary neurologist: Dr. Pearlean BrownieSethi Reason for Referral: Stroke    No chief complaint on file.     HPI:   Update 06/08/2022 Marie Walker: Patient returns for 1721-month follow-up.  At prior visit, complaints of cognitive decline, word finding difficulty and new onset headache.  Completed MRI brain which did not show any acute abnormalities or new findings compared to prior imaging.   Denies any new stroke/TIA or neurological symptoms.  Compliant on aspirin atorvastatin Blood pressure ***    History provided for reference purposes only Update 12/09/2021 Marie Walker: Patient returns for follow-up visit after prior visit with Dr. Pearlean BrownieSethi 6 months ago. Accompanied by her husband, Marie Walker. At visit back in October, she had concerns of lower extremity tremor upon standing with dizziness.  Cervical imaging completed which showed degenerative changes but otherwise unremarkable.  She had follow-up with Dr. Pearlean BrownieSethi 2 months later for further evaluation and fortunately, symptoms had resolved during the interval time.  Thankfully, she has not had any recurrence of these symptoms.  She is concerned regarding cognitive decline since prior visit.  She also notes difficulty with finding correct words at times or delayed word finding difficulties. Reports gradual decline, denies any abrupt worsening. Long term memory intact.  She does have history of ADHD previously on Adderall which was discontinued when she had her stroke, her PCP plans on restarting this.  She questions if untreated ADHD could be contributing to her memory loss.  At times can have issues with insomnia, has difficulty shutting her brain off.  Previously underwent sleep study (3-4 yrs ago) which was negative for apnea per patient (unable to view report).  Reports  approximately 4 weeks ago, she was experiencing daily morning headaches that lasted for 1 week, located right side on top of head. Would take additional aspirin with resolution of headache.  Denies any photophobia, phonophobia, N/V or any other neurological symptoms associated.  Does have history of migraines but "many years ago" and these headaches felt different from her prior migraines. She continues to maintain ADLs and majority of IADLs independently.  Compliant on aspirin and atorvastatin, denies side effects.  Blood pressure today 155/60. Occasionally monitors at home, typically stable 130s/80s.   No further concerns at this time   Update 06/08/2021 Dr. Pearlean BrownieSethi: She returns for follow-up after last visit with Marie Walker nurse practitioner on 04/19/2021.  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  Patient however states that since that visit the tremor has practically disappeared in a few weeks after that visit.  She has had no tremors in the last several months.  She states her short-term memory difficulties are about unchanged.  Memory loss is not progressive.  She does do regular cognitive exercises on the app [email or web address].  She also play some games on her phone.  She has had no recurrent stroke or TIA symptoms since her original stroke 2 years ago.  She remains on aspirin which is tolerating well without bleeding or bruising.  Her blood pressure is under good control today it is 131/84.  She is tolerating Lipitor well without muscle aches and pains.  She has no new complaints today.  She has upcoming annual physical exam due with primary care  physician and will have follow-up lab work done at that visit.    Update 04/19/2021 Marie Walker: Returns for 21-month stroke follow-up accompanied by her husband, Marie Walker.  Overall stable without new stroke/TIA symptoms.  Reports continued short-term memory difficulties stable without  worsening.  Maintains ADLs and IADLs independently.  Compliant on aspirin and atorvastatin without side effects.  Blood pressure today 138/87.   Reports over the past 6 months, she has experienced 2-3 episodes of right leg tremor while standing associated with dizziness sensation. Typically resolves after sitting or taking pressure off from leg. Reports approx 1 month ago, she experienced dizziness sensation upon standing and after walking short distance (into kitchen) her legs started shaking bilaterally and then progressed to bilateral upper extremity tremor with difficulty holding on to a cup that was in her hands. This lasted approx 2-3 minutes. This led to a fall but thankfully without injury or hitting her head. She has not had any additional episodes since that time. She is scheduled in December with Dr. Pearlean Brownie for further evaluation  Routinely followed by [facility] for chronic low back pain - completed imaging a few months ago for worsening back pain - unable to view via epic. Mild neck pain/tightness but denies radiculopathy.  Recently diagnosed with shingles 5-6 weeks ago - residual nerve pain left side waist to shoulder - currently on gabapentin 300 mg 3 times daily -recent episode occurred prior to starting gabapentin.    Update 10/20/2020 Marie Walker: Marie Walker returns for 37-month stroke follow-up accompanied by her husband.  Doing well since prior visit without new or reoccurring stroke/TIA symptoms.  She does report continued short-term memory issues but has been stable without worsening.  Continues to maintain ADLs and IADLs independently and routinely doing memory exercises.  Denies depression or anxiety.  Compliant on aspirin and atorvastatin without associated side effects.  Blood pressure today 116/73. Reports lab work by PCP completed in December with level satisfactory.  Routinely followed by [facility] for lower back and hip pain.  Reports recent left arm spasm upon reaching overhead only  lasting for a couple seconds without any reoccurring symptoms.  Denies shoulder or arm pain but does endorse occasional neck tightness sensation.  No further concerns at this time  Update 04/21/2020 Marie Walker: Marie Walker returns for stroke follow-up accompanied by her husband. Stable since prior visit without residual deficits and denies new or reoccurring stroke/TIA symptoms. She does report some short term memory loss which has been present since her stroke but also endorses multiple other stressors that may be contributing.  She will occasionally forget recent conversations, repeat a question or forget what she wants on television.  She is able to maintain ADLs and IADLs independently.  Short-term memory concerns have been stable without worsening and does do daily memory exercises such as crossword puzzles, sudoku and exercises on Lumosity.  Remains on aspirin and atorvastatin 60 mg daily for secondary stroke prevention. She did have cholesterol levels checked by PCP around July or August and plans on repeating in December at yearly physical. Blood pressure today 130/82.  She does not routinely monitor at home.  No further concerns at this time.  Initial visit 12/26/2019 Dr. Pearlean Brownie: Marie Walker is a pleasant 71 year old Caucasian lady is seen today for initial office consultation visit for stroke.  She is accompanied by her husband.  History is obtained from them as well as review of referral notes.  No actual imaging films available in PACS for review today.  She has past medical  history of hypertension hyperlipidemia obesity and gout who developed sudden onset of right upper extremity paresthesias from elbow down as well as some gait ataxia and stumbling on 09/11/2019.  She was [facility] to [facility] and [place].  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  MR angiogram of the neck was unremarkable hemoglobin A1c was normal at 5.3 and LDL was elevated at 161 mg percent.  Echocardiogram showed normal ejection fraction of 65%.  She was started on aspirin Plavix and a statin.  She is out of follow-up lipid profile checked by primary physician in 11/04/2019 which had improved but was still elevated 84 mg percent.  Her dose of Lipitor was however not increased she is on aspirin Plavix and tolerating fairly well but does complain of increased bruising.  She underwent 2-week Holter monitor placed by outpatient cardiologist at [facility] which showed no evidence of significant cardiac arrhythmias.  She denies any history of syncope palpitations.  She states numbness and gait ataxia completely resolved within a day after admission.  She has had no recurrent stroke or TIA symptoms.  She states she is doing well and has no complaints today.  She has no prior history of strokes TIAs seizures or significant neurological problems.  No family history of strokes.   ROS:   14 system review of systems is positive for those listed in HPI and all other systems negative  PMH:  Past Medical History:  Diagnosis Date   ADD (attention deficit disorder)    Asthma    Dermatitis    Hyperlipidemia    Hypertension    Stroke [facility])     Social History:  Social History   Socioeconomic History   Marital status: Married    Spouse name: Not on file   Number of children: Not on file   Years of education: Not on file   Highest education level: Not on file  Occupational History   Not on file  Tobacco Use   Smoking status: Never   Smokeless tobacco: Never  Substance and Sexual Activity   Alcohol use: Yes    Alcohol/week: 2.0 standard drinks of alcohol    Types: 1 Glasses of wine, 1 Shots of liquor per week    Comment: ocassionally   Drug use:  Not Currently   Sexual activity: Not on file  Other Topics Concern   Not on file  Social History Narrative   Not on file   Social Determinants of Health   Financial Resource Strain: Not on file  Food Insecurity: Not on file  Transportation Needs: Not on file  Physical Activity: Not on file  Stress: Not on file  Social Connections: Not on file  Intimate Partner Violence: Not on file    Medications:   Current Outpatient Medications on File Prior to Visit  Medication Sig Dispense Refill   albuterol (VENTOLIN HFA) 108 (90 Base) MCG/ACT inhaler every 4 (four) hours as needed.      allopurinol (ZYLOPRIM) 100 MG tablet 1/2 tablet     amLODipine (NORVASC) 10 MG tablet amlodipine 10 mg tablet     amphetamine-dextroamphetamine (ADDERALL XR) 10 MG 24 hr capsule Take 10 mg by mouth daily.  aspirin 81 MG EC tablet Take by mouth once. Tablet     atorvastatin (LIPITOR) 80 MG tablet Take 1 tablet (80 mg total) by mouth daily. 90 tablet 3   B COMPLEX VITAMINS ER PO Take by mouth.     BIOTIN PO Take by mouth.     cetirizine (ZYRTEC) 10 MG tablet Take 1 tablet (10 mg total) by mouth daily. 30 tablet 11   fluticasone (FLONASE) 50 MCG/ACT nasal spray Place 1 spray into both nostrils daily. 16 g 11   hydrOXYzine (ATARAX/VISTARIL) 25 MG tablet Take 25 mg by mouth as needed.      montelukast (SINGULAIR) 10 MG tablet Take 1 tablet (10 mg total) by mouth at bedtime. 30 tablet 11   pantoprazole (PROTONIX) 40 MG tablet Take 40 mg by mouth daily.     No current facility-administered medications on file prior to visit.    Allergies:   Allergies  Allergen Reactions   Morphine Other (See Comments)    Adverse reaction   Orphenadrine Other (See Comments)    Makes BP drop    Oxycodone-Acetaminophen Other (See Comments)   Augmentin [Amoxicillin-Pot Clavulanate] Nausea And Vomiting   Doxycycline Nausea And Vomiting   Orphenadrine Citrate Swelling   Other      Physical Exam There were no vitals  filed for this visit.  There is no height or weight on file to calculate BMI.   General: Obese very pleasant elderly Caucasian lady seated, in no evident distress Head: head normocephalic and atraumatic.   Neck: supple with no carotid or supraclavicular bruits Cardiovascular: regular rate and rhythm, no murmurs Musculoskeletal: no deformity Skin:  no rash/petichiae Vascular:  Normal pulses all extremities  Neurologic Exam Mental Status: Awake and fully alert.  Fluent speech and language.  Unable to appreciate aphasia during today's visit.  Oriented to place and time. Recent memory impaired and remote memory intact. Attention span, concentration and fund of knowledge appropriate during visit. Mood and affect appropriate.     12/09/2021   11:23 AM  MMSE - Mini Mental State Exam  Orientation to time 4  Orientation to Place 5  Registration 3  Attention/ Calculation 5  Recall 3  Language- name 2 objects 2  Language- repeat 1  Language- follow 3 step command 2  Language- read & follow direction 1  Write a sentence 1  Copy design 1  Total score 28   Cranial Nerves: Pupils equal, briskly reactive to light. Extraocular movements full without nystagmus. Visual fields full to confrontation. Hearing intact. Facial sensation intact. Face, tongue, palate moves normally and symmetrically.  Motor: Normal bulk and tone. Normal strength in all tested extremity muscles. Sensory.: intact to touch , pinprick , position and vibratory sensation.  Coordination: Rapid alternating movements normal in all extremities. Finger-to-nose and heel-to-shin performed accurately bilaterally.  Gait and Station: Arises from chair without difficulty. Stance is normal. Gait demonstrates normal stride, and balance without use of assistive device.  Difficulty performing heel, toe and tandem walk Reflexes: 1+ and symmetric. Toes downgoing.     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 Vascular risk factors of pre-DM, hypertension, hyperlipidemia, intracranial stenosis and obesity. Back in 03/2021, reported 6 month onset of 2-3 episodes of intermittent RLE tremor while standing and in 02/2021, experienced BLE tremor which progressed to BUE tremor leading a fall lasting 2-3 minutes. These  eventually resolved on their own.    At today's visit, reports cognitive decline, occasional word finding difficulty and headaches.    1. L PCA stroke -Continue aspirin 81 mg daily and atorvastatin 80mg  daily for secondary stroke prevention  -LDL 74 (10/2021)  -A1c 6.0 (08/2020) -Discussed secondary stroke prevention measures and importance of close PCP follow-up to maintain strict control of hypertension with blood pressure goal below 130/90, prediabetes with A1c goal<7 and lipids with LDL cholesterol goal below 70 mg/dL.   2.  New onset of headaches 3.  Transient aphasia 4.  Mild cognitive impairment with decline -MRI brain 11/2021 no acute abnormalities -Obtain MRI brain to rule out underlying etiologies of new symptoms although low suspicion for new stroke. Suspect word finding difficulty in setting of cognitive decline possibly in setting of multiple other health related issues, ADHD (PCP plans on restarting Adderall) and likely underlying anxiety -Has not had any additional headaches -no red flag symptoms and neuro exam intact - advised to call if they should reoccur  -MMSE 28/30 -Dementia panel WNL -Discussed continuing memory exercises and discussed importance of managing stroke risk factors, routine exercise, healthy diet and adequate sleep  5. Tremor of unknown etiology -Resolved on own     Follow-up in 6 months or call earlier if needed    CC:  12/2021, MD    I spent 38 minutes of face-to-face and non-face-to-face time with patient and  husband.  This included previsit chart review, lab review, study review, order entry, electronic health record documentation, patient and husband education and discussion regarding above diagnoses and treatment plan and answered all the questions to patient and husband satisfaction  Aliene Beams, AGNP-BC  [facility] [address] [place], [place]  Phone [phone] Fax [phone] Note: This document was prepared with digital dictation and possible smart phrase technology. Any transcriptional errors that result from this process are unintentional.

## 2022-06-08 ENCOUNTER — Ambulatory Visit: Payer: Medicare Other | Admitting: Adult Health

## 2022-06-29 ENCOUNTER — Encounter: Payer: Self-pay | Admitting: Adult Health

## 2022-06-29 ENCOUNTER — Ambulatory Visit (INDEPENDENT_AMBULATORY_CARE_PROVIDER_SITE_OTHER): Payer: Medicare Other | Admitting: Adult Health

## 2022-06-29 VITALS — BP 128/66 | Ht 59.0 in | Wt 172.4 lb

## 2022-06-29 DIAGNOSIS — I63432 Cerebral infarction due to embolism of left posterior cerebral artery: Secondary | ICD-10-CM

## 2022-06-29 DIAGNOSIS — G44201 Tension-type headache, unspecified, intractable: Secondary | ICD-10-CM | POA: Diagnosis not present

## 2022-06-29 MED ORDER — METHYLPREDNISOLONE 4 MG PO TBPK: ORAL_TABLET | ORAL | 0 refills | Status: DC

## 2022-06-29 NOTE — Progress Notes (Signed)
[facility] [address] [place]. [place] [address] [phone]       STROKE FOLLOW UP NOTE  Ms. Marie Walker Date of Birth:  [birth year] Medical Record Number:  [id number]    Primary neurologist: Dr. Leonie Man Reason for Referral: Stroke    Chief Complaint  Patient presents with   Follow-up    Patient in room #2 with her husband and grandchild. Pt here today to f/u Cerebrovascular accident.      HPI:   Update 06/29/2022 JM: Patient is being seen for acute visit due to worsening headaches.  Reports over the past few weeks, she has been experiencing mild posterior headaches about every other day but has been experiencing a persistent headache over the past 3 days and today associated with light and noise sensitivity as well as mild nausea.  She has taken Tylenol for the past 3 days without any significant benefit.  Denies any other associated symptoms with current headache, denies any visual changes, speech changes, weakness/numbness, dizziness/imbalance or cognitive changes.  Denies worsening baseline position.  Does endorse increased stressors over the past few weeks with the holidays as well as poor sleep especially last night as they are watching their young grand daughter.  She also endorses some allergies and increased congestion.  Does have mild neck pain but nothing overly bothersome. Prior complaints of new onset headache at prior visit as well as word finding difficulty and cognitive decline, completed MRI brain which did not show any new findings compared to prior imaging.  These headaches eventually resolved and denies any continued word finding difficulties and cognition has been stable.  Compliant on aspirin and atorvastatin Blood pressure 128/66    History provided for reference purposes only Update 12/09/2021 JM: Patient returns for follow-up visit after prior visit with Dr. Leonie Man 6 months ago. Accompanied by her husband, Jeneen Rinks. At visit back in October,  she had concerns of lower extremity tremor upon standing with dizziness.  Cervical imaging completed which showed degenerative changes but otherwise unremarkable.  She had follow-up with Dr. Leonie Man 2 months later for further evaluation and fortunately, symptoms had resolved during the interval time.  Thankfully, she has not had any recurrence of these symptoms.  She is concerned regarding cognitive decline since prior visit.  She also notes difficulty with finding correct words at times or delayed word finding difficulties. Reports gradual decline, denies any abrupt worsening. Long term memory intact.  She does have history of ADHD previously on Adderall which was discontinued when she had her stroke, her PCP plans on restarting this.  She questions if untreated ADHD could be contributing to her memory loss.  At times can have issues with insomnia, has difficulty shutting her brain off.  Previously underwent sleep study (3-4 yrs ago) which was negative for apnea per patient (unable to view report).  Reports approximately 4 weeks ago, she was experiencing daily morning headaches that lasted for 1 week, located right side on top of head. Would take additional aspirin with resolution of headache.  Denies any photophobia, phonophobia, N/V or any other neurological symptoms associated.  Does have history of migraines but "many years ago" and these headaches felt different from her prior migraines. She continues to maintain ADLs and majority of IADLs independently.  Compliant on aspirin and atorvastatin, denies side effects.  Blood pressure today 155/60. Occasionally monitors at home, typically stable 130s/80s.   No further concerns at this time   Update 06/08/2021 Dr. Leonie Man: She returns for follow-up after  last visit with Marie Walker nurse practitioner on 04/19/2021.  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  Patient however  states that since that visit the tremor has practically disappeared in a few weeks after that visit.  She has had no tremors in the last several months.  She states her short-term memory difficulties are about unchanged.  Memory loss is not progressive.  She does do regular cognitive exercises on the app [email or web address].  She also play some games on her phone.  She has had no recurrent stroke or TIA symptoms since her original stroke 2 years ago.  She remains on aspirin which is tolerating well without bleeding or bruising.  Her blood pressure is under good control today it is 131/84.  She is tolerating Lipitor well without muscle aches and pains.  She has no new complaints today.  She has upcoming annual physical exam due with primary care physician and will have follow-up lab work done at that visit.    Update 04/19/2021 JM: Returns for 64-month stroke follow-up accompanied by her husband, Jeneen Rinks.  Overall stable without new stroke/TIA symptoms.  Reports continued short-term memory difficulties stable without worsening.  Maintains ADLs and IADLs independently.  Compliant on aspirin and atorvastatin without side effects.  Blood pressure today 138/87.   Reports over the past 6 months, she has experienced 2-3 episodes of right leg tremor while standing associated with dizziness sensation. Typically resolves after sitting or taking pressure off from leg. Reports approx 1 month ago, she experienced dizziness sensation upon standing and after walking short distance (into kitchen) her legs started shaking bilaterally and then progressed to bilateral upper extremity tremor with difficulty holding on to a cup that was in her hands. This lasted approx 2-3 minutes. This led to a fall but thankfully without injury or hitting her head. She has not had any additional episodes since that time. She is scheduled in December with Dr. Leonie Man for further evaluation  Routinely followed by [facility] for chronic low back pain -  completed imaging a few months ago for worsening back pain - unable to view via epic. Mild neck pain/tightness but denies radiculopathy.  Recently diagnosed with shingles 5-6 weeks ago - residual nerve pain left side waist to shoulder - currently on gabapentin 300 mg 3 times daily -recent episode occurred prior to starting gabapentin.    Update 10/20/2020 JM: Marie Walker returns for 20-month stroke follow-up accompanied by her husband.  Doing well since prior visit without new or reoccurring stroke/TIA symptoms.  She does report continued short-term memory issues but has been stable without worsening.  Continues to maintain ADLs and IADLs independently and routinely doing memory exercises.  Denies depression or anxiety.  Compliant on aspirin and atorvastatin without associated side effects.  Blood pressure today 116/73. Reports lab work by PCP completed in December with level satisfactory.  Routinely followed by [facility] for lower back and hip pain.  Reports recent left arm spasm upon reaching overhead only lasting for a couple seconds without any reoccurring symptoms.  Denies shoulder or arm pain but does endorse occasional neck tightness sensation.  No further concerns at this time  Update 04/21/2020 JM: Marie Walker returns for stroke follow-up accompanied by her husband. Stable since prior visit without residual deficits and denies new or reoccurring stroke/TIA symptoms. She does report some short term memory loss which has been present since her stroke but also endorses multiple other stressors  that may be contributing.  She will occasionally forget recent conversations, repeat a question or forget what she wants on television.  She is able to maintain ADLs and IADLs independently.  Short-term memory concerns have been stable without worsening and does do daily memory exercises such as crossword puzzles, sudoku and exercises on [employer].  Remains on aspirin and atorvastatin 60 mg daily for secondary stroke  prevention. She did have cholesterol levels checked by PCP around July or August and plans on repeating in December at yearly physical. Blood pressure today 130/82.  She does not routinely monitor at home.  No further concerns at this time.  Initial visit 12/26/2019 Dr. Leonie Man: Marie Walker is a pleasant 72 year old Caucasian lady is seen today for initial office consultation visit for stroke.  She is accompanied by her husband.  History is obtained from them as well as review of referral notes.  No actual imaging films available in PACS for review today.  She has past medical history of hypertension hyperlipidemia obesity and gout who developed sudden onset of right upper extremity paresthesias from elbow down as well as some gait ataxia and stumbling on 09/11/2019.  She was admitted to [facility] and [place].  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  MR angiogram of the neck was unremarkable hemoglobin A1c was normal at 5.3 and LDL was elevated at 161 mg percent.  Echocardiogram showed normal ejection fraction of 65%.  She was started on aspirin Plavix and a statin.  She is out of follow-up lipid profile checked by primary physician in 11/04/2019 which had improved but was still elevated 84 mg percent.  Her dose of Lipitor was however not increased she is on aspirin Plavix and tolerating fairly well but does complain of increased bruising.  She underwent 2-week Holter monitor placed by outpatient cardiologist at [facility] which showed no evidence of significant cardiac arrhythmias.  She denies any history of syncope palpitations.  She states numbness and gait ataxia completely resolved within a day after admission.  She  has had no recurrent stroke or TIA symptoms.  She states she is doing well and has no complaints today.  She has no prior history of strokes TIAs seizures or significant neurological problems.  No family history of strokes.   ROS:   14 system review of systems is positive for those listed in HPI and all other systems negative  PMH:  Past Medical History:  Diagnosis Date   ADD (attention deficit disorder)    Asthma    Dermatitis    Hyperlipidemia    Hypertension    Stroke [facility])     Social History:  Social History   Socioeconomic History   Marital status: Married    Spouse name: Not on file   Number of children: Not on file   Years of education: Not on file   Highest education level: Not on file  Occupational History   Not on file  Tobacco Use   Smoking status: Never   Smokeless tobacco: Never  Substance and Sexual Activity   Alcohol use: Yes    Alcohol/week: 2.0 standard drinks of alcohol    Types: 1 Glasses of wine, 1 Shots of liquor per  week    Comment: ocassionally   Drug use: Not Currently   Sexual activity: Not on file  Other Topics Concern   Not on file  Social History Narrative   Not on file   Social Determinants of Health   Financial Resource Strain: Not on file  Food Insecurity: Not on file  Transportation Needs: Not on file  Physical Activity: Not on file  Stress: Not on file  Social Connections: Not on file  Intimate Partner Violence: Not on file    Medications:   Current Outpatient Medications on File Prior to Visit  Medication Sig Dispense Refill   albuterol (VENTOLIN HFA) 108 (90 Base) MCG/ACT inhaler every 4 (four) hours as needed.      allopurinol (ZYLOPRIM) 100 MG tablet 1/2 tablet     amLODipine (NORVASC) 10 MG tablet amlodipine 10 mg tablet     amphetamine-dextroamphetamine (ADDERALL XR) 10 MG 24 hr capsule Take 10 mg by mouth daily.     aspirin 81 MG EC tablet Take by mouth once. Tablet     atorvastatin (LIPITOR) 80 MG tablet Take 1  tablet (80 mg total) by mouth daily. 90 tablet 3   B COMPLEX VITAMINS ER PO Take by mouth.     BIOTIN PO Take by mouth.     cetirizine (ZYRTEC) 10 MG tablet Take 1 tablet (10 mg total) by mouth daily. 30 tablet 11   fluticasone (FLONASE) 50 MCG/ACT nasal spray Place 1 spray into both nostrils daily. 16 g 11   hydrOXYzine (ATARAX/VISTARIL) 25 MG tablet Take 25 mg by mouth as needed.      montelukast (SINGULAIR) 10 MG tablet Take 1 tablet (10 mg total) by mouth at bedtime. 30 tablet 11   pantoprazole (PROTONIX) 40 MG tablet Take 40 mg by mouth daily.     No current facility-administered medications on file prior to visit.    Allergies:   Allergies  Allergen Reactions   Morphine Other (See Comments)    Adverse reaction   Orphenadrine Other (See Comments)    Makes BP drop    Oxycodone-Acetaminophen Other (See Comments)   Augmentin [Amoxicillin-Pot Clavulanate] Nausea And Vomiting   Doxycycline Nausea And Vomiting   Orphenadrine Citrate Swelling   Other      Physical Exam Today's Vitals   06/29/22 1420  BP: 128/66  Weight: 172 lb 6.4 oz (78.2 kg)  Height: 4\' 11"  (1.499 m)   Body mass index is 34.82 kg/m.  General: Obese very pleasant elderly Caucasian lady seated, in no evident distress Head: head normocephalic and atraumatic.   Neck: supple with no carotid or supraclavicular bruits Cardiovascular: regular rate and rhythm, no murmurs Musculoskeletal: no deformity Skin:  no rash/petichiae Vascular:  Normal pulses all extremities  Neurologic Exam Mental Status: Awake and fully alert.  Fluent speech and language.  Unable to appreciate aphasia during today's visit.  Oriented to place and time. Recent memory impaired and remote memory intact. Attention span, concentration and fund of knowledge appropriate during visit. Mood and affect appropriate.  Cranial Nerves: Pupils equal, briskly reactive to light. Extraocular movements full without nystagmus. Visual fields full to  confrontation. Hearing intact. Facial sensation intact. Face, tongue, palate moves normally and symmetrically.  Motor: Normal bulk and tone. Normal strength in all tested extremity muscles. Sensory.: intact to touch , pinprick , position and vibratory sensation.  Coordination: Rapid alternating movements normal in all extremities. Finger-to-nose and heel-to-shin performed accurately bilaterally.  Gait and Station: Arises from chair without difficulty. Stance  is normal. Gait demonstrates normal stride, and balance without use of assistive device.  Difficulty performing heel, toe and tandem walk Reflexes: 1+ and symmetric. Toes downgoing.     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 Vascular risk factors of pre-DM, hypertension, hyperlipidemia, intracranial stenosis and obesity. Back in 03/2021, reported 6 month onset of 2-3 episodes of intermittent RLE tremor while standing and in 02/2021, experienced BLE tremor which progressed to BUE tremor leading a fall lasting 2-3 minutes. These eventually resolved on their own.    At today's visit, reports cognitive decline, occasional word finding difficulty and headaches.    Acute onset headache Mix of tension and migrainous type features likely in setting of increased stress and decreased sleep Hx of headaches/migraines, previously completed MRI brain with prior headache back in June which was negative for acute findings Neuroexam today intact without any red flag features, can hold off on repeat imaging for now Recommend Medrol Dosepak She would like to avoid starting a daily preventative if able Advised to call if headaches persist, may consider initiating preventative therapy and/or imaging at that time Advised to call 911 immediately if she experiences any severe headache associated with  N/V and/or neurological/stroke related symptoms  L PCA stroke Continue aspirin 81 mg daily and atorvastatin 80mg  daily for secondary stroke prevention  Discussed secondary stroke prevention measures and importance of close PCP follow-up to maintain strict control of hypertension with blood pressure goal below 130/90, prediabetes with A1c goal<7 and lipids with LDL cholesterol goal below 70 mg/dL.     Follow-up in 3 months or call earlier if needed    CC:  Caren Macadam, MD    I spent 31 minutes of face-to-face and non-face-to-face time with patient and husband.  This included previsit chart review, lab review, study review, order entry, electronic health record documentation, patient and husband education and discussion regarding above diagnoses and treatment plan and answered all the questions to patient and husband satisfaction  Frann Rider, AGNP-BC  [facility] [address] [place], [place] [address]  Phone [phone] Fax [phone] Note: This document was prepared with digital dictation and possible smart phrase technology. Any transcriptional errors that result from this process are unintentional.

## 2022-06-29 NOTE — Patient Instructions (Addendum)
Start medrol dose pac to try to help break your current headache cycle  If headaches should worsen or do not improve, please let me know If headaches should significantly worsen or associated with any stroke type symptoms, please let me know  Please limit use of tylenol to a couple times per week due to concern of rebound headache   Continue aspirin 81 mg daily  and atorvastatin for secondary stroke prevention  Continue to follow up with PCP regarding blood pressure and cholesterol management  Maintain strict control of hypertension with blood pressure goal below 130/90 and cholesterol with LDL cholesterol (bad cholesterol) goal below 70 mg/dL.   Signs of a Stroke? Follow the BEFAST method:  Balance Watch for a sudden loss of balance, trouble with coordination or vertigo Eyes Is there a sudden loss of vision in one or both eyes? Or double vision?  Face: Ask the person to smile. Does one side of the face droop or is it numb?  Arms: Ask the person to raise both arms. Does one arm drift downward? Is there weakness or numbness of a leg? Speech: Ask the person to repeat a simple phrase. Does the speech sound slurred/strange? Is the person confused ? Time: If you observe any of these signs, call 911.     Followup in the future with me in 3 months or call earlier if needed     Thank you for coming to see [place] at [facility]. I hope we have been able to provide you high quality care today.  You may receive a patient satisfaction survey over the next few weeks. We would appreciate your feedback and comments so that we may continue to improve ourselves and the health of our patients.

## 2022-08-25 ENCOUNTER — Ambulatory Visit (INDEPENDENT_AMBULATORY_CARE_PROVIDER_SITE_OTHER): Payer: Medicare Other | Admitting: Podiatry

## 2022-08-25 ENCOUNTER — Ambulatory Visit (INDEPENDENT_AMBULATORY_CARE_PROVIDER_SITE_OTHER): Payer: Medicare Other

## 2022-08-25 DIAGNOSIS — Q666 Other congenital valgus deformities of feet: Secondary | ICD-10-CM | POA: Diagnosis not present

## 2022-08-25 DIAGNOSIS — M778 Other enthesopathies, not elsewhere classified: Secondary | ICD-10-CM

## 2022-08-25 DIAGNOSIS — M722 Plantar fascial fibromatosis: Secondary | ICD-10-CM | POA: Diagnosis not present

## 2022-08-25 DIAGNOSIS — I63432 Cerebral infarction due to embolism of left posterior cerebral artery: Secondary | ICD-10-CM | POA: Diagnosis not present

## 2022-08-25 DIAGNOSIS — M19079 Primary osteoarthritis, unspecified ankle and foot: Secondary | ICD-10-CM | POA: Diagnosis not present

## 2022-08-25 NOTE — Progress Notes (Signed)
Subjective:   Patient ID: Marie Walker, female   DOB: 72 y.o.   MRN: [id number]   HPI Chief Complaint  Patient presents with   Foot Pain    Pain in both feet    72 year old female presents the office with above concerns.  Right is worse than left. It started a couple of months ago. She can walk on them but towards midday when the pain gets worse. She had a fracture in 2021 but no recent injuries. She gets some swelling to the ankles. No numbness or tingling.   Had orthtoics previously but did not remember recently.  She had a stroke 3 years ago.    Review of Systems  All other systems reviewed and are negative.  Past Medical History:  Diagnosis Date   ADD (attention deficit disorder)    Asthma    Dermatitis    Hyperlipidemia    Hypertension    Stroke [facility])     Past Surgical History:  Procedure Laterality Date   BREAST BIOPSY     REPLACEMENT TOTAL KNEE BILATERAL     right foot fracture      TONSILLECTOMY       Current Outpatient Medications:    albuterol (VENTOLIN HFA) 108 (90 Base) MCG/ACT inhaler, every 4 (four) hours as needed. , Disp: , Rfl:    allopurinol (ZYLOPRIM) 100 MG tablet, 1/2 tablet, Disp: , Rfl:    amLODipine (NORVASC) 10 MG tablet, amlodipine 10 mg tablet, Disp: , Rfl:    amphetamine-dextroamphetamine (ADDERALL XR) 10 MG 24 hr capsule, Take 10 mg by mouth daily., Disp: , Rfl:    aspirin 81 MG EC tablet, Take by mouth once. Tablet, Disp: , Rfl:    atorvastatin (LIPITOR) 80 MG tablet, Take 1 tablet (80 mg total) by mouth daily., Disp: 90 tablet, Rfl: 3   B COMPLEX VITAMINS ER PO, Take by mouth., Disp: , Rfl:    BIOTIN PO, Take by mouth., Disp: , Rfl:    cetirizine (ZYRTEC) 10 MG tablet, Take 1 tablet (10 mg total) by mouth daily., Disp: 30 tablet, Rfl: 11   fluticasone (FLONASE) 50 MCG/ACT nasal spray, Place 1 spray into both nostrils daily., Disp: 16 g, Rfl: 11   hydrOXYzine (ATARAX/VISTARIL) 25 MG tablet, Take 25 mg by mouth as needed. , Disp: , Rfl:     methylPREDNISolone (MEDROL DOSEPAK) 4 MG TBPK tablet, Follow instructions on package, Disp: 1 each, Rfl: 0   montelukast (SINGULAIR) 10 MG tablet, Take 1 tablet (10 mg total) by mouth at bedtime., Disp: 30 tablet, Rfl: 11   pantoprazole (PROTONIX) 40 MG tablet, Take 40 mg by mouth daily., Disp: , Rfl:   Allergies  Allergen Reactions   Morphine Other (See Comments)    Adverse reaction   Orphenadrine Other (See Comments)    Makes BP drop    Oxycodone-Acetaminophen Other (See Comments)   Augmentin [Amoxicillin-Pot Clavulanate] Nausea And Vomiting   Doxycycline Nausea And Vomiting   Orphenadrine Citrate Swelling   Other           Objective:  Physical Exam  General: AAO x3, NAD  Dermatological: Skin is warm, dry and supple bilateral.  There are no open sores, no preulcerative lesions, no rash or signs of infection present.  Vascular: Dorsalis Pedis artery and Posterior Tibial artery pedal pulses are 2/4 bilateral with immedate capillary fill time. There is no pain with calf compression, swelling, warmth, erythema.   Neruologic: Grossly intact via light touch bilateral.    Musculoskeletal:  There is a decreased medial arch upon weightbearing bilaterally.  There is tenderness on the plantar aspect calcaneus on the insertion of the plantar fascia.  There is no area pinpoint tenderness noted today.  There is no pain along the ankle or flexor, extensor tendons today.  Ankle range of motion intact.  Decreased range of motion of the first MPJ.  MMT 5/5.  Gait: Unassisted, Nonantalgic.       Assessment:   71 year old female with flatfoot, Plantar fasciitis     Plan:  -Treatment options discussed including all alternatives, risks, and complications -Etiology of symptoms were discussed -X-rays were obtained and reviewed with the patient.  3 views bilateral feet were obtained.  Evidence of flatfoot.  No evidence of acute fracture.  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 -Voltaren gel prn -Plantar fascia braces dispensed x 2 to help support the plantar fascia -Discussed stretching, icing on a regular basis. -Discussed using good arch supports.  We discussed different types of inserts and she was to proceed with a custom inserts.  She was measured today for these.  No follow-ups on file.  Trula Slade DPM

## 2022-08-25 NOTE — Patient Instructions (Signed)
If was nice to meet you today. If you have any questions or any further concerns, please feel fee to give me a call. You can call our office at [phone] or please feel fee to send me a message through [place].   ----  For instructions on how to put on your Plantar Fascial Brace, please visit [email or web address]  ---     Plantar Fasciitis (Heel Spur Syndrome) with Rehab The plantar fascia is a fibrous, ligament-like, soft-tissue structure that spans the bottom of the foot. Plantar fasciitis is a condition that causes pain in the foot due to inflammation of the tissue. SYMPTOMS  Pain and tenderness on the underneath side of the foot. Pain that worsens with standing or walking. CAUSES  Plantar fasciitis is caused by irritation and injury to the plantar fascia on the underneath side of the foot. Common mechanisms of injury include: Direct trauma to bottom of the foot. Damage to a small nerve that runs under the foot where the main fascia attaches to the heel bone. Stress placed on the plantar fascia due to bone spurs. RISK INCREASES WITH:  Activities that place stress on the plantar fascia (running, jumping, pivoting, or cutting). Poor strength and flexibility. Improperly fitted shoes. Tight calf muscles. Flat feet. Failure to warm-up properly before activity. Obesity. PREVENTION Warm up and stretch properly before activity. Allow for adequate recovery between workouts. Maintain physical fitness: Strength, flexibility, and endurance. Cardiovascular fitness. Maintain a health body weight. Avoid stress on the plantar fascia. Wear properly fitted shoes, including arch supports for individuals who have flat feet.  PROGNOSIS  If treated properly, then the symptoms of plantar fasciitis usually resolve without surgery. However, occasionally surgery is necessary.  RELATED COMPLICATIONS  Recurrent symptoms that may result in a chronic condition. Problems of the lower back  that are caused by compensating for the injury, such as limping. Pain or weakness of the foot during push-off following surgery. Chronic inflammation, scarring, and partial or complete fascia tear, occurring more often from repeated injections.  TREATMENT  Treatment initially involves the use of ice and medication to help reduce pain and inflammation. The use of strengthening and stretching exercises may help reduce pain with activity, especially stretches of the Achilles tendon. These exercises may be performed at home or with a therapist. Your caregiver may recommend that you use heel cups of arch supports to help reduce stress on the plantar fascia. Occasionally, corticosteroid injections are given to reduce inflammation. If symptoms persist for greater than 6 months despite non-surgical (conservative), then surgery may be recommended.   MEDICATION  If pain medication is necessary, then nonsteroidal anti-inflammatory medications, such as aspirin and ibuprofen, or other minor pain relievers, such as acetaminophen, are often recommended. Do not take pain medication within 7 days before surgery. Prescription pain relievers may be given if deemed necessary by your caregiver. Use only as directed and only as much as you need. Corticosteroid injections may be given by your caregiver. These injections should be reserved for the most serious cases, because they may only be given a certain number of times.  HEAT AND COLD Cold treatment (icing) relieves pain and reduces inflammation. Cold treatment should be applied for 10 to 15 minutes every 2 to 3 hours for inflammation and pain and immediately after any activity that aggravates your symptoms. Use ice packs or massage the area with a piece of ice (ice massage). Heat treatment may be used prior to performing the stretching and strengthening activities prescribed by your caregiver,  physical therapist, or athletic trainer. Use a heat pack or soak the injury  in warm water.  SEEK IMMEDIATE MEDICAL CARE IF: Treatment seems to offer no benefit, or the condition worsens. Any medications produce adverse side effects.  EXERCISES- RANGE OF MOTION (ROM) AND STRETCHING EXERCISES - Plantar Fasciitis (Heel Spur Syndrome) These exercises may help you when beginning to rehabilitate your injury. Your symptoms may resolve with or without further involvement from your physician, physical therapist or athletic trainer. While completing these exercises, remember:  Restoring tissue flexibility helps normal motion to return to the joints. This allows healthier, less painful movement and activity. An effective stretch should be held for at least 30 seconds. A stretch should never be painful. You should only feel a gentle lengthening or release in the stretched tissue.  RANGE OF MOTION - Toe Extension, Flexion Sit with your right / left leg crossed over your opposite knee. Grasp your toes and gently pull them back toward the top of your foot. You should feel a stretch on the bottom of your toes and/or foot. Hold this stretch for 10 seconds. Now, gently pull your toes toward the bottom of your foot. You should feel a stretch on the top of your toes and or foot. Hold this stretch for 10 seconds. Repeat  times. Complete this stretch 3 times per day.   RANGE OF MOTION - Ankle Dorsiflexion, Active Assisted Remove shoes and sit on a chair that is preferably not on a carpeted surface. Place right / left foot under knee. Extend your opposite leg for support. Keeping your heel down, slide your right / left foot back toward the chair until you feel a stretch at your ankle or calf. If you do not feel a stretch, slide your bottom forward to the edge of the chair, while still keeping your heel down. Hold this stretch for 10 seconds. Repeat 3 times. Complete this stretch 2 times per day.   STRETCH  Gastroc, Standing Place hands on wall. Extend right / left leg, keeping the  front knee somewhat bent. Slightly point your toes inward on your back foot. Keeping your right / left heel on the floor and your knee straight, shift your weight toward the wall, not allowing your back to arch. You should feel a gentle stretch in the right / left calf. Hold this position for 10 seconds. Repeat 3 times. Complete this stretch 2 times per day.  STRETCH  Soleus, Standing Place hands on wall. Extend right / left leg, keeping the other knee somewhat bent. Slightly point your toes inward on your back foot. Keep your right / left heel on the floor, bend your back knee, and slightly shift your weight over the back leg so that you feel a gentle stretch deep in your back calf. Hold this position for 10 seconds. Repeat 3 times. Complete this stretch 2 times per day.  STRETCH  Gastrocsoleus, Standing  Note: This exercise can place a lot of stress on your foot and ankle. Please complete this exercise only if specifically instructed by your caregiver.  Place the ball of your right / left foot on a step, keeping your other foot firmly on the same step. Hold on to the wall or a rail for balance. Slowly lift your other foot, allowing your body weight to press your heel down over the edge of the step. You should feel a stretch in your right / left calf. Hold this position for 10 seconds. Repeat this exercise with a  slight bend in your right / left knee. Repeat 3 times. Complete this stretch 2 times per day.   STRENGTHENING EXERCISES - Plantar Fasciitis (Heel Spur Syndrome)  These exercises may help you when beginning to rehabilitate your injury. They may resolve your symptoms with or without further involvement from your physician, physical therapist or athletic trainer. While completing these exercises, remember:  Muscles can gain both the endurance and the strength needed for everyday activities through controlled exercises. Complete these exercises as instructed by your physician,  physical therapist or athletic trainer. Progress the resistance and repetitions only as guided.  STRENGTH - Towel Curls Sit in a chair positioned on a non-carpeted surface. Place your foot on a towel, keeping your heel on the floor. Pull the towel toward your heel by only curling your toes. Keep your heel on the floor. Repeat 3 times. Complete this exercise 2 times per day.  STRENGTH - Ankle Inversion Secure one end of a rubber exercise band/tubing to a fixed object (table, pole). Loop the other end around your foot just before your toes. Place your fists between your knees. This will focus your strengthening at your ankle. Slowly, pull your big toe up and in, making sure the band/tubing is positioned to resist the entire motion. Hold this position for 10 seconds. Have your muscles resist the band/tubing as it slowly pulls your foot back to the starting position. Repeat 3 times. Complete this exercises 2 times per day.  Document Released: 06/13/2005 Document Revised: 09/05/2011 Document Reviewed: 09/25/2008 [facility] Patient Information 2014 [place], [place].

## 2022-09-14 ENCOUNTER — Ambulatory Visit: Payer: Medicare Other | Admitting: Adult Health

## 2022-09-26 ENCOUNTER — Ambulatory Visit (INDEPENDENT_AMBULATORY_CARE_PROVIDER_SITE_OTHER): Payer: Medicare Other

## 2022-09-26 DIAGNOSIS — M722 Plantar fascial fibromatosis: Secondary | ICD-10-CM

## 2022-09-26 NOTE — Progress Notes (Signed)
Patient presents today to pick up custom molded foot orthotics recommended by Dr. WAGONER.   Orthotics were dispensed and fit was satisfactory. Reviewed instructions for break-in and wear. Written instructions given to patient.  Patient will follow up as needed.     

## 2022-10-04 ENCOUNTER — Ambulatory Visit: Payer: Medicare Other | Admitting: Adult Health

## 2022-10-05 ENCOUNTER — Other Ambulatory Visit: Payer: Medicare Other

## 2022-11-30 ENCOUNTER — Encounter (HOSPITAL_COMMUNITY): Payer: Self-pay

## 2022-11-30 ENCOUNTER — Ambulatory Visit (HOSPITAL_COMMUNITY)
Admission: RE | Admit: 2022-11-30 | Discharge: 2022-11-30 | Disposition: A | Discharge status: Home / Self Care | Payer: Medicare Other | Source: Ambulatory Visit | Attending: Emergency Medicine | Admitting: Emergency Medicine

## 2022-11-30 VITALS — BP 145/81 | HR 92 | Temp 97.8°F | Resp 18

## 2022-11-30 DIAGNOSIS — N3001 Acute cystitis with hematuria: Secondary | ICD-10-CM

## 2022-11-30 LAB — POCT URINALYSIS DIP (MANUAL ENTRY)
Bilirubin, UA: NEGATIVE
Glucose, UA: NEGATIVE mg/dL
Ketones, POC UA: NEGATIVE mg/dL
Leukocytes, UA: NEGATIVE
Nitrite, UA: NEGATIVE
Protein Ur, POC: NEGATIVE mg/dL
Spec Grav, UA: 1.02 (ref 1.010–1.025)
Urobilinogen, UA: 0.2 U/dL
pH, UA: 7 (ref 5.0–8.0)

## 2022-11-30 MED ORDER — SULFAMETHOXAZOLE-TRIMETHOPRIM 800-160 MG PO TABS: 1.0000 | ORAL_TABLET | Freq: Two times a day (BID) | ORAL | 0 refills | Status: AC

## 2022-11-30 MED ORDER — PHENAZOPYRIDINE HCL 95 MG PO TABS: 95.0000 mg | ORAL_TABLET | Freq: Three times a day (TID) | ORAL | 0 refills | Status: DC | PRN

## 2022-11-30 NOTE — ED Triage Notes (Signed)
Pt c/o urine frequency x2wks, burning on urination started yesterday. States had a positive home UTI test.

## 2022-11-30 NOTE — ED Provider Notes (Signed)
[facility]    CSN: [id number] Arrival date & time: 11/30/22  1033      History   Chief Complaint Chief Complaint  Patient presents with   Urinary Frequency    Took a test for UTI and was positive - Entered by patient    HPI Marie Walker is a 72 y.o. female.   Patient presents to clinic for urinary frequency that has been ongoing for 2 weeks.  She noticed some discomfort with urination yesterday.  She took a home urinary tract infection test that was positive.  She has not taken anything for her symptoms.  She denies fevers, flank pain, nausea or vomiting.  Has a history of urinary tract infections, last one was about 3 years ago.  She has a history of hypertension, hyperlipidemia and stroke.  No history of diabetes.  The history is provided by the patient and medical records.  Urinary Frequency    Past Medical History:  Diagnosis Date   ADD (attention deficit disorder)    Asthma    Dermatitis    Hyperlipidemia    Hypertension    Stroke [facility])     Patient Active Problem List   Diagnosis Date Noted   Acute gout involving toe of left foot 06/08/2021   Asthma without status asthmaticus 06/08/2021   Attention deficit disorder (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 06/08/2021   Chronic gouty arthritis 06/08/2021   Essential hypertension 06/08/2021   Fibromyalgia 06/08/2021   Gastroesophageal reflux disease without esophagitis 06/08/2021   Generalized anxiety disorder 06/08/2021   Gout attack 06/08/2021   Hearing loss 06/08/2021   Muscular fasciculation 06/08/2021   Obesity 06/08/2021   Otitis externa 06/08/2021   Primary localized osteoarthritis of pelvic region and thigh 06/08/2021   Pure hypercholesterolemia 06/08/2021   Seasonal allergic rhinitis 06/08/2021   TIA (transient ischemic attack) 01/29/2021    Past Surgical History:  Procedure Laterality Date   BREAST BIOPSY     REPLACEMENT TOTAL KNEE BILATERAL     right foot fracture      TONSILLECTOMY      OB History    No obstetric history on file.      Home Medications    Prior to Admission medications   Medication Sig Start Date End Date Taking? Authorizing Provider  phenazopyridine (PYRIDIUM) 95 MG tablet Take 1 tablet (95 mg total) by mouth 3 (three) times daily as needed for pain. 11/30/22  Yes Rinaldo Ratel, [place] N, FNP  sulfamethoxazole-trimethoprim (BACTRIM DS) 800-160 MG tablet Take 1 tablet by mouth 2 (two) times daily for 3 days. 11/30/22 12/03/22 Yes Rinaldo Ratel, [place] N, FNP  albuterol (VENTOLIN HFA) 108 (90 Base) MCG/ACT inhaler every 4 (four) hours as needed.  11/05/19   [provider]  allopurinol (ZYLOPRIM) 100 MG tablet 1/2 tablet 08/11/21   [provider]  amLODipine (NORVASC) 10 MG tablet amlodipine 10 mg tablet 09/03/19   [provider]  amphetamine-dextroamphetamine (ADDERALL XR) 10 MG 24 hr capsule Take 10 mg by mouth daily.    [provider]  aspirin 81 MG EC tablet Take by mouth once. Tablet 09/01/19   [provider]  atorvastatin (LIPITOR) 80 MG tablet Take 1 tablet (80 mg total) by mouth daily. 10/22/20   Ihor Austin, NP  B COMPLEX VITAMINS ER PO Take by mouth.    [provider]  BIOTIN PO Take by mouth.    [provider]  cetirizine (ZYRTEC) 10 MG tablet Take 1 tablet (10 mg total) by mouth daily.  01/31/22   Charlott Holler, MD  fluticasone (FLONASE) 50 MCG/ACT nasal spray Place 1 spray into both nostrils daily. 01/31/22   Charlott Holler, MD  hydrOXYzine (ATARAX/VISTARIL) 25 MG tablet Take 25 mg by mouth as needed.  11/05/19   [provider]  montelukast (SINGULAIR) 10 MG tablet Take 1 tablet (10 mg total) by mouth at bedtime. 01/31/22   Charlott Holler, MD  pantoprazole (PROTONIX) 40 MG tablet Take 40 mg by mouth daily. 12/19/19   [provider]    Family History Family History  Problem Relation Age of Onset   Lung disease Neg Hx     Social History Social History   Tobacco Use   Smoking status:  Never   Smokeless tobacco: Never  Substance Use Topics   Alcohol use: Yes    Alcohol/week: 2.0 standard drinks of alcohol    Types: 1 Glasses of wine, 1 Shots of liquor per week    Comment: ocassionally   Drug use: Not Currently     Allergies   Morphine, Orphenadrine, Oxycodone-acetaminophen, Augmentin [amoxicillin-pot clavulanate], Doxycycline, Orphenadrine citrate, and Other   Review of Systems Review of Systems  Constitutional:  Negative for fever.  Genitourinary:  Positive for dysuria, frequency and urgency. Negative for flank pain.     Physical Exam Triage Vital Signs ED Triage Vitals  Enc Vitals Group     BP 11/30/22 1103 (!) 145/81     Pulse Rate 11/30/22 1103 92     Resp 11/30/22 1103 18     Temp 11/30/22 1103 97.8 F (36.6 C)     Temp Source 11/30/22 1103 Oral     SpO2 11/30/22 1103 96 %     Weight --      Height --      Head Circumference --      Peak Flow --      Pain Score 11/30/22 1106 0     Pain Loc --      Pain Edu? --      Excl. in GC? --    No data found.  Updated Vital Signs BP (!) 145/81 (BP Location: Right Arm)   Pulse 92   Temp 97.8 F (36.6 C) (Oral)   Resp 18   SpO2 96%   Visual Acuity Right Eye Distance:   Left Eye Distance:   Bilateral Distance:    Right Eye Near:   Left Eye Near:    Bilateral Near:     Physical Exam Vitals and nursing note reviewed.  Constitutional:      Appearance: Normal appearance.  HENT:     Head: Normocephalic and atraumatic.     Right Ear: External ear normal.     Left Ear: External ear normal.     Nose: Nose normal.     Mouth/Throat:     Mouth: Mucous membranes are moist.  Eyes:     Pupils: Pupils are equal, round, and reactive to light.  Cardiovascular:     Rate and Rhythm: Normal rate and regular rhythm.     Heart sounds: Normal heart sounds. No murmur heard. Pulmonary:     Effort: Pulmonary effort is normal. No respiratory distress.     Breath sounds: Normal breath sounds.  Abdominal:      General: Abdomen is flat.     Palpations: Abdomen is soft.     Tenderness: There is no right CVA tenderness or left CVA tenderness.  Musculoskeletal:        General: Normal  range of motion.  Skin:    General: Skin is warm and dry.  Neurological:     General: No focal deficit present.     Mental Status: She is alert and oriented to person, place, and time.  Psychiatric:        Mood and Affect: Mood normal.        Behavior: Behavior normal.      UC Treatments / Results  Labs (all labs ordered are listed, but only abnormal results are displayed) Labs Reviewed  POCT URINALYSIS DIP (MANUAL ENTRY) - Abnormal; Notable for the following components:      Result Value   Blood, UA moderate (*)    All other components within normal limits  URINE CULTURE    EKG   Radiology No results found.  Procedures Procedures (including critical care time)  Medications Ordered in UC Medications - No data to display  Initial Impression / Assessment and Plan / UC Course  I have reviewed the triage vital signs and the nursing notes.  Pertinent labs & imaging results that were available during my care of the patient were reviewed by me and considered in my medical decision making (see chart for details).  Vitals and triage reviewed, patient is hemodynamically stable.  Negative for CVA tenderness, fever, tachycardia or acute systemic illness.  Urinalysis with moderate RBCs, will send for culture.  Will place on 3 days of Bactrim and treat symptoms with Azo.  Encouraged hydration.  Strict emergency room return precautions given, no questions at this time.     Final Clinical Impressions(s) / UC Diagnoses   Final diagnoses:  Acute cystitis with hematuria     Discharge Instructions      Your urine had a moderate amount of red blood cells in it, combined with your symptoms I am covering you for a urinary tract infection.  Please take all antibiotics as prescribed until finished, you can  take them with food or meals to help prevent gastrointestinal upset.  You can also take the Pyridium 3 times daily as needed for pain.  Ensure you are drinking at least 64 ounces of water daily to help flush your kidneys.  We are sending your urine for culture and we will contact you if you need to modify your antibiotic therapy.  Please return to clinic or seek immediate care if you develop nausea, vomiting, fever, or no improvement in symptoms despite finishing antibiotics.      ED Prescriptions     Medication Sig Dispense Auth. Provider   phenazopyridine (PYRIDIUM) 95 MG tablet Take 1 tablet (95 mg total) by mouth 3 (three) times daily as needed for pain. 10 tablet Rinaldo Ratel, [place], FNP   sulfamethoxazole-trimethoprim (BACTRIM DS) 800-160 MG tablet Take 1 tablet by mouth 2 (two) times daily for 3 days. 6 tablet Debbie Yearick, [place] N, [place]      PDMP not reviewed this encounter.   Keston Seever, [place] N, [place] 11/30/22 [phone]

## 2022-11-30 NOTE — Discharge Instructions (Addendum)
Your urine had a moderate amount of red blood cells in it, combined with your symptoms I am covering you for a urinary tract infection.  Please take all antibiotics as prescribed until finished, you can take them with food or meals to help prevent gastrointestinal upset.  You can also take the Pyridium 3 times daily as needed for pain.  Ensure you are drinking at least 64 ounces of water daily to help flush your kidneys.  We are sending your urine for culture and we will contact you if you need to modify your antibiotic therapy.  Please return to clinic or seek immediate care if you develop nausea, vomiting, fever, or no improvement in symptoms despite finishing antibiotics.

## 2022-12-01 LAB — URINE CULTURE: Culture: <10000 — AB

## 2022-12-06 IMAGING — DX DG CHEST 2V
2 series · 2 of 2 positions shown · non-contrast
Comparison: None.

CLINICAL DATA: Chest pain

EXAM:
CHEST - 2 VIEW

[chest pa]
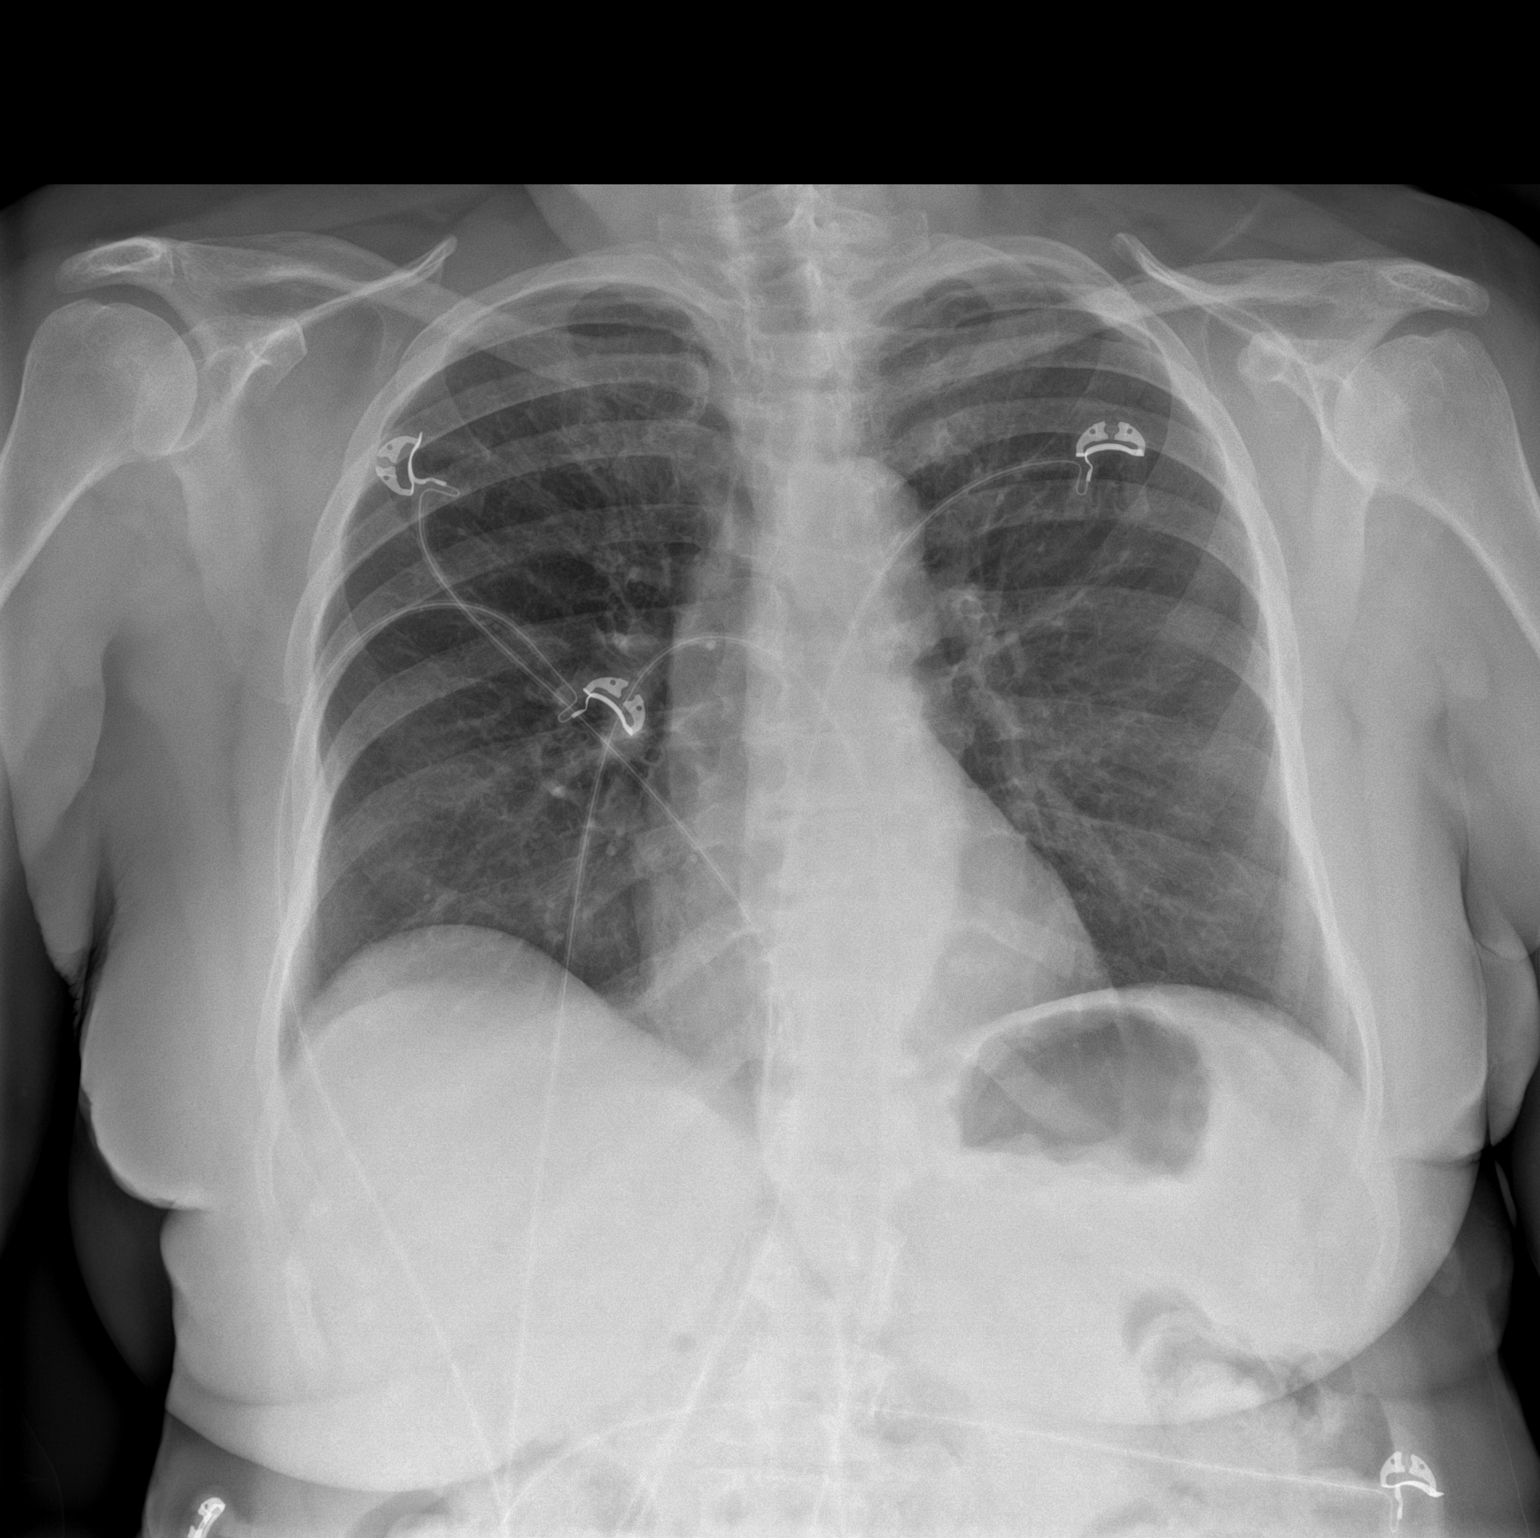

[chest lat]
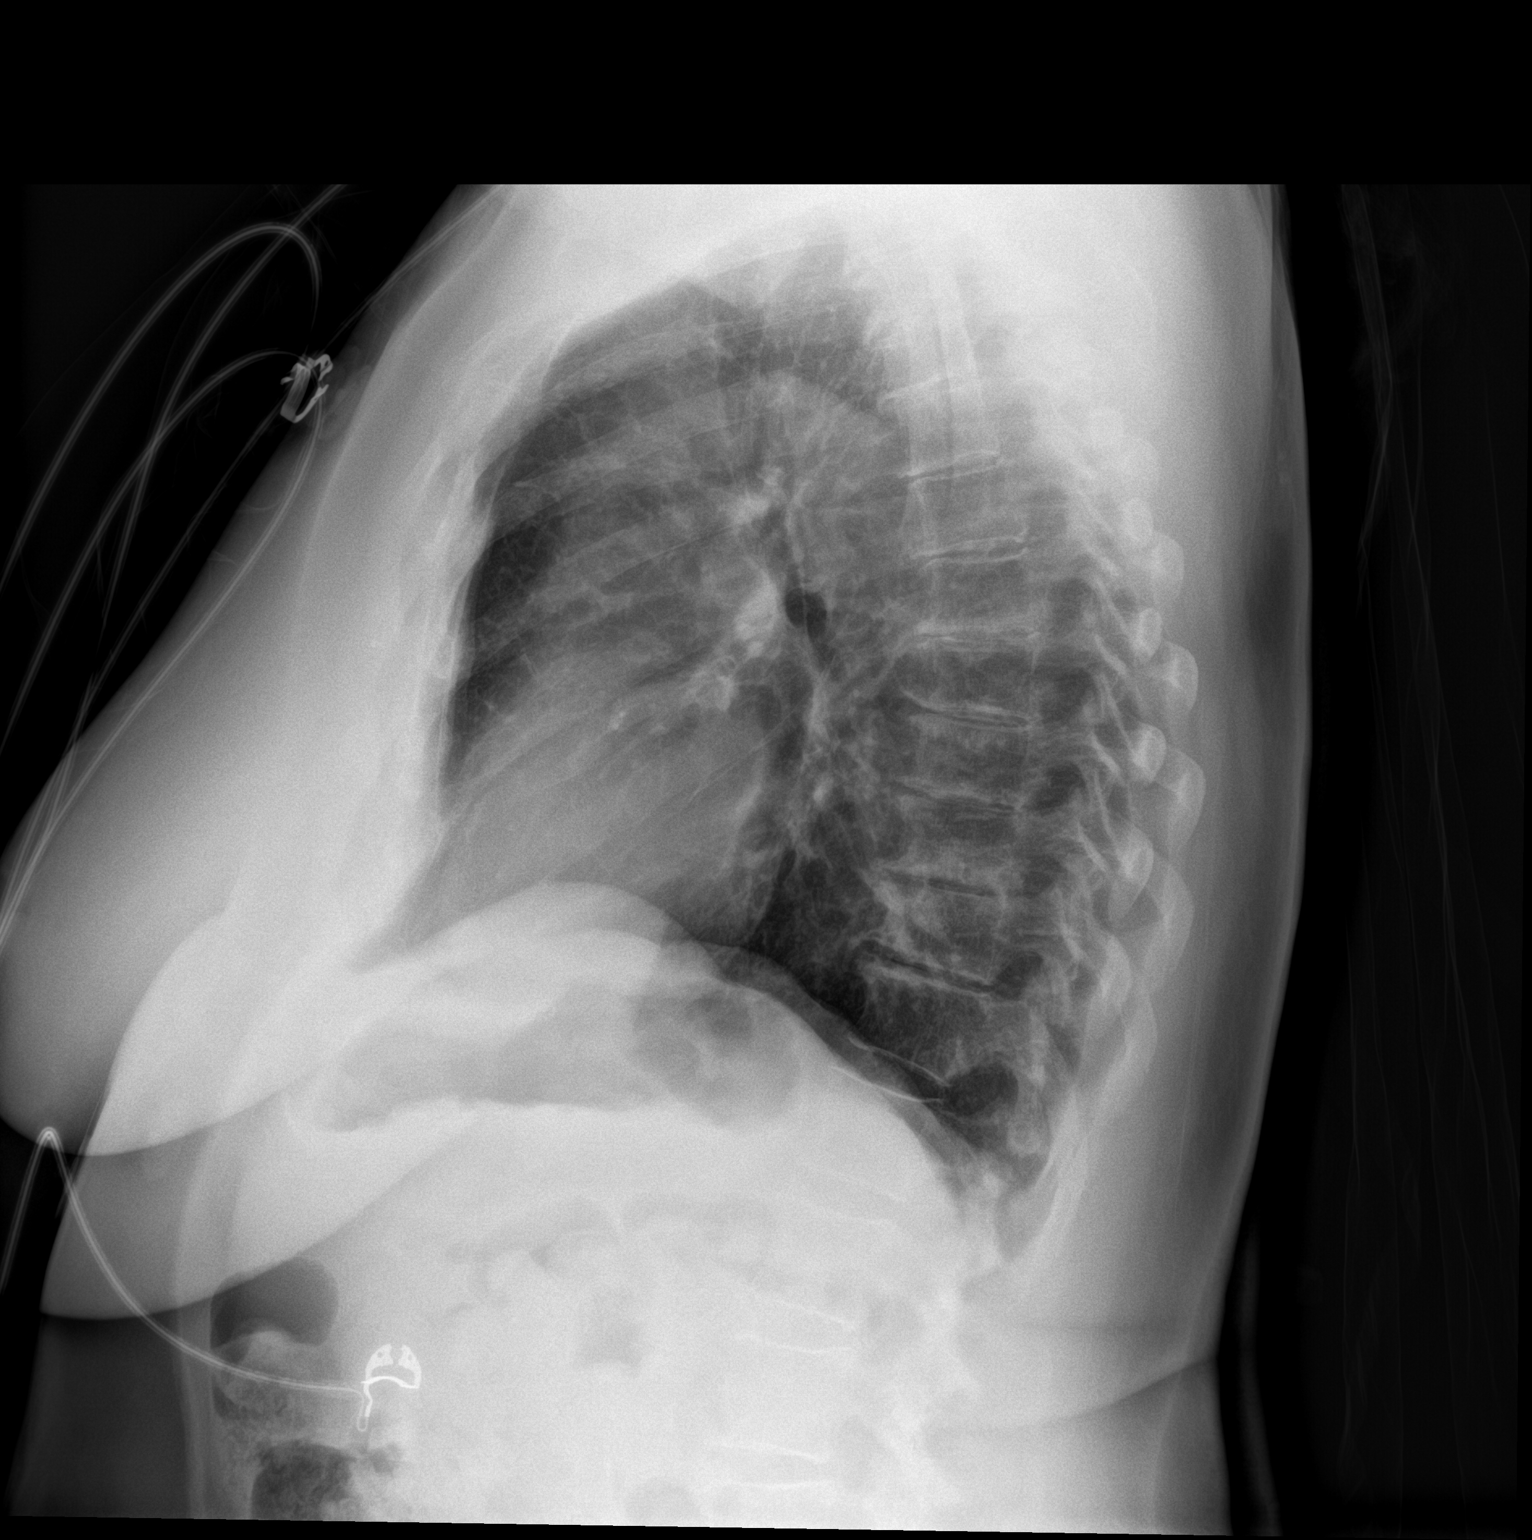

[2 of 2 positions shown; findings below may reference images not displayed]

FINDINGS: The heart size and mediastinal contours are within normal limits. No
focal airspace consolidation, pleural effusion, or pneumothorax. The
visualized skeletal structures are unremarkable.
IMPRESSION: No active cardiopulmonary disease.

## 2022-12-15 ENCOUNTER — Encounter: Payer: Self-pay | Admitting: Family Medicine

## 2022-12-15 ENCOUNTER — Ambulatory Visit (INDEPENDENT_AMBULATORY_CARE_PROVIDER_SITE_OTHER): Payer: Medicare Other | Admitting: Family Medicine

## 2022-12-15 VITALS — BP 117/78 | HR 101 | Temp 97.7°F | Resp 16 | Ht 60.0 in | Wt 165.5 lb

## 2022-12-15 DIAGNOSIS — R252 Cramp and spasm: Secondary | ICD-10-CM

## 2022-12-15 DIAGNOSIS — Z79899 Other long term (current) drug therapy: Secondary | ICD-10-CM | POA: Diagnosis not present

## 2022-12-15 DIAGNOSIS — I10 Essential (primary) hypertension: Secondary | ICD-10-CM | POA: Diagnosis not present

## 2022-12-15 DIAGNOSIS — E7800 Pure hypercholesterolemia, unspecified: Secondary | ICD-10-CM

## 2022-12-15 DIAGNOSIS — K219 Gastro-esophageal reflux disease without esophagitis: Secondary | ICD-10-CM | POA: Diagnosis not present

## 2022-12-15 DIAGNOSIS — J4520 Mild intermittent asthma, uncomplicated: Secondary | ICD-10-CM

## 2022-12-15 DIAGNOSIS — R519 Headache, unspecified: Secondary | ICD-10-CM

## 2022-12-15 DIAGNOSIS — M1A9XX0 Chronic gout, unspecified, without tophus (tophi): Secondary | ICD-10-CM

## 2022-12-15 DIAGNOSIS — G459 Transient cerebral ischemic attack, unspecified: Secondary | ICD-10-CM

## 2022-12-15 DIAGNOSIS — R3121 Asymptomatic microscopic hematuria: Secondary | ICD-10-CM | POA: Insufficient documentation

## 2022-12-15 DIAGNOSIS — F988 Other specified behavioral and emotional disorders with onset usually occurring in childhood and adolescence: Secondary | ICD-10-CM

## 2022-12-15 DIAGNOSIS — M1A00X0 Idiopathic chronic gout, unspecified site, without tophus (tophi): Secondary | ICD-10-CM | POA: Diagnosis not present

## 2022-12-15 LAB — LIPID PANEL
Cholesterol: 189 mg/dL (ref 0–200)
HDL: 64.1 mg/dL (ref >39.00)
LDL Cholesterol: 105 mg/dL — ABNORMAL HIGH (ref 0–99)
NonHDL: 124.5
Total CHOL/HDL Ratio: 3
Triglycerides: 97 mg/dL (ref 0.0–149.0)
VLDL: 19.4 mg/dL (ref 0.0–40.0)

## 2022-12-15 LAB — COMPREHENSIVE METABOLIC PANEL
ALT: 21 U/L (ref 0–35)
AST: 27 U/L (ref 0–37)
Albumin: 4.3 g/dL (ref 3.5–5.2)
Alkaline Phosphatase: 97 U/L (ref 39–117)
BUN: 19 mg/dL (ref 6–23)
CO2: 26 mEq/L (ref 19–32)
Calcium: 9.8 mg/dL (ref 8.4–10.5)
Chloride: 102 mEq/L (ref 96–112)
Creatinine, Ser: 1.23 mg/dL — ABNORMAL HIGH (ref 0.40–1.20)
GFR: 43.33 mL/min — ABNORMAL LOW (ref >60.00)
Glucose, Bld: 100 mg/dL — ABNORMAL HIGH (ref 70–99)
Potassium: 4.3 mEq/L (ref 3.5–5.1)
Sodium: 141 mEq/L (ref 135–145)
Total Bilirubin: 1 mg/dL (ref 0.2–1.2)
Total Protein: 6.9 g/dL (ref 6.0–8.3)

## 2022-12-15 LAB — CBC WITH DIFFERENTIAL/PLATELET
Basophils Absolute: 0 10*3/uL (ref 0.0–0.1)
Basophils Relative: 0.7 % (ref 0.0–3.0)
Eosinophils Absolute: 0.2 10*3/uL (ref 0.0–0.7)
Eosinophils Relative: 3.4 % (ref 0.0–5.0)
HCT: 38.1 % (ref 36.0–46.0)
Hemoglobin: 12.5 g/dL (ref 12.0–15.0)
Lymphocytes Relative: 16.6 % (ref 12.0–46.0)
Lymphs Abs: 1 10*3/uL (ref 0.7–4.0)
MCHC: 32.9 g/dL (ref 30.0–36.0)
MCV: 96.9 fl (ref 78.0–100.0)
Monocytes Absolute: 0.6 10*3/uL (ref 0.1–1.0)
Monocytes Relative: 9.5 % (ref 3.0–12.0)
Neutro Abs: 4.3 10*3/uL (ref 1.4–7.7)
Neutrophils Relative %: 69.8 % (ref 43.0–77.0)
Platelets: 289 10*3/uL (ref 150.0–400.0)
RBC: 3.93 Mil/uL (ref 3.87–5.11)
RDW: 13.3 % (ref 11.5–15.5)
WBC: 6.1 10*3/uL (ref 4.0–10.5)

## 2022-12-15 LAB — MAGNESIUM: Magnesium: 2 mg/dL (ref 1.5–2.5)

## 2022-12-15 LAB — VITAMIN B12: Vitamin B-12: >1500 pg/mL — ABNORMAL HIGH (ref 211–911)

## 2022-12-15 LAB — TSH: TSH: 1.34 u[IU]/mL (ref 0.35–5.50)

## 2022-12-15 LAB — URIC ACID: Uric Acid, Serum: 7.6 mg/dL — ABNORMAL HIGH (ref 2.4–7.0)

## 2022-12-15 NOTE — Progress Notes (Signed)
Labs mostly ok 1.  Uric acid(gout) is a little elevated.  Keep working on diet since not flaring 2.  LDL too high-recommend increasing atorvastatin to 80mg  daily

## 2022-12-15 NOTE — Assessment & Plan Note (Signed)
H/o.  Pt on statin, ASA 81mg  daily

## 2022-12-15 NOTE — Assessment & Plan Note (Signed)
Chronic  controlled on zyrtec 10mg , flonase, singulair 10mg .  Rare ventolin use

## 2022-12-15 NOTE — Assessment & Plan Note (Signed)
Chronic.  Taking adderall XR 15mg .  Advised I didn't want to rx but "need it, not doing well without it".  PDMP checked.

## 2022-12-15 NOTE — Assessment & Plan Note (Signed)
Chronic.  Controlled off meds.  Low purine diet.  Not flares.  Will continue to monitor

## 2022-12-15 NOTE — Patient Instructions (Addendum)
Welcome to [facility]! It was a pleasure meeting you today.  As discussed, Please schedule a 6 month follow up visit today.  Stretches calves, neck shoulders,   husband massage neck/shoulders.  PLEASE NOTE:  If you had any LAB tests please let us know if you have not heard back within a few days. You may see your results on MyChart before we have a chance to review them but we will give you a call once they are reviewed by [place]. If we ordered any REFERRALS today, please let us know if you have not heard from their office within the next week.  Let us know through MyChart if you are needing REFILLS, or have your pharmacy send [place] the request. You can also use MyChart to communicate with me or any office staff.  Please try these tips to maintain a healthy lifestyle:  Eat most of your calories during the day when you are active. Eliminate processed foods including packaged sweets (pies, cakes, cookies), reduce intake of potatoes, white bread, white pasta, and white rice. Look for whole grain options, oat flour or almond flour.  Each meal should contain half fruits/vegetables, one quarter protein, and one quarter carbs (no bigger than a computer mouse).  Cut down on sweet beverages. This includes juice, soda, and sweet tea. Also watch fruit intake, though this is a healthier sweet option, it still contains natural sugar! Limit to 3 servings daily.  Drink at least 1 glass of water with each meal and aim for at least 8 glasses per day  Exercise at least 150 minutes every week.

## 2022-12-15 NOTE — Progress Notes (Signed)
New Patient Office Visit  Subjective:  Patient ID: Marie Walker, female    DOB: [birth year]  Age: 72 y.o. MRN: [id number]  CC:  Chief Complaint  Patient presents with   Establish Care    Initial visit to establish care with new pcp Had tea, no food    HPI Marie Walker presents for new pt-establish.  Mult medical  HTN-Pt is on amlodipine 10mg .  Bp's running ok  No dizziness/cp/palp/edema/cough/sob.  Exercises.  HLD-on atorvastatin 60mg .  No SE H/o CVA 09/11/19-on ASA, statin.   No residual.  Some memory impairment. Prob L posterior cerebral artery GERD-doing well on protonix 40mg  daily.  No dysphagia.  No bleeding Asthma/cough-on zyrtec, flonase, singulair 10mg .  Using ventolin - rare use. Gout-not taking 50mg  allopurinol daily-no flares. Just wants to be off meds ADD-taking meds.  Mind wanders..  can't function w/o it.     HA-getting most days for 2 weeks(coming later in day).  Takes ASA and ok for the rest of the day.  Not severe..  not sleeping well.  Wakes up 2-3am and can't get back to sleep.  Having dreams.  Not nightmares(nothing new).  HA on R top.  No n/v.       Current Outpatient Medications:    albuterol (VENTOLIN HFA) 108 (90 Base) MCG/ACT inhaler, every 4 (four) hours as needed. , Disp: , Rfl:    amLODipine (NORVASC) 10 MG tablet, amlodipine 10 mg tablet, Disp: , Rfl:    aspirin 81 MG EC tablet, Take by mouth once. Tablet, Disp: , Rfl:    atorvastatin (LIPITOR) 40 MG tablet, Take 60 mg by mouth daily., Disp: , Rfl:    B COMPLEX VITAMINS ER PO, Take by mouth., Disp: , Rfl:    BIOTIN PO, Take by mouth., Disp: , Rfl:    CALCIUM PO, Take by mouth., Disp: , Rfl:    cetirizine (ZYRTEC) 10 MG tablet, Take 1 tablet (10 mg total) by mouth daily., Disp: 30 tablet, Rfl: 11   Ergocalciferol 10 MCG (400 UNIT) TABS, Take 1,000 mcg by mouth daily., Disp: , Rfl:    fluticasone (FLONASE) 50 MCG/ACT nasal spray, Place 1 spray into both nostrils daily., Disp: 16 g, Rfl: 11   montelukast  (SINGULAIR) 10 MG tablet, Take 1 tablet (10 mg total) by mouth at bedtime., Disp: 30 tablet, Rfl: 11   Multiple Vitamin (MULTIVITAMIN) tablet, Take 1 tablet by mouth daily., Disp: , Rfl:    pantoprazole (PROTONIX) 40 MG tablet, Take 40 mg by mouth daily., Disp: , Rfl:   Past Medical History:  Diagnosis Date   ADD (attention deficit disorder)    Allergy    Anxiety    Asthma    Depression    Dermatitis    GERD (gastroesophageal reflux disease)    Heart murmur    Hyperlipidemia    Hypertension    Stroke [facility])     Past Surgical History:  Procedure Laterality Date   BREAST BIOPSY  1980   REPLACEMENT TOTAL KNEE BILATERAL     right-2019, left-2009   right foot fracture      TONSILLECTOMY  1954    Family History  Problem Relation Age of Onset   Hypertension Mother    Hyperlipidemia Mother    Hearing loss Mother    Cancer Mother    Arthritis Mother    Heart attack Mother    Stroke Father    Heart attack Father    Hearing loss Father  Cancer Father    Alzheimer's disease Father    Depression Sister    Stroke Brother    Hypertension Brother    Arthritis Brother    Depression Brother    Heart attack Maternal Grandmother    Hearing loss Maternal Grandfather    Cancer Paternal Grandmother    Hearing loss Paternal Grandmother    Alcohol abuse Paternal Grandmother    Vision loss Paternal Grandmother    Early death Paternal Grandfather    Lung disease Neg Hx     Social History   Socioeconomic History   Marital status: Married    Spouse name: Not on file   Number of children: 4   Years of education: Not on file   Highest education level: Not on file  Occupational History   Not on file  Tobacco Use   Smoking status: Never   Smokeless tobacco: Never  Vaping Use   Vaping Use: Never used  Substance and Sexual Activity   Alcohol use: Yes    Alcohol/week: 2.0 standard drinks of alcohol    Types: 1 Glasses of wine, 1 Shots of liquor per week    Comment:  ocassionally   Drug use: Not Currently   Sexual activity: Yes  Other Topics Concern   Not on file  Social History Narrative   Retired Product/process development scientist 9   Social Determinants of Health   Financial Resource Strain: Not on file  Food Insecurity: Not on file  Transportation Needs: Not on file  Physical Activity: Not on file  Stress: Not on file  Social Connections: Not on file  Intimate Partner Violence: Not on file    ROS  ROS: Gen: no fever, chills  Skin: no rash, itching ENT: no ear pain, ear drainage, nasal congestion, rhinorrhea, sinus pressure, sore throat Eyes: no blurry vision, double vision Resp: no cough, wheeze,SOB CV: no CP, palpitations, LE edema,  GI: no heartburn, n/v/d/c, abd pain GU: no dysuria, urgency, frequency.  Chronic micro hematuria.  ZOX:WRUEA in L calf intermitt for 1-2 wks.  More when walks but can be at bed.  Resolves quickly.  Neuro: no dizziness,  weakness, vertigo.  +HA intermitt.  Has seen neuro.  H/o migraine.  Takes mg daily.  Psych: no depression, anxiety, insomnia, SI   Objective:   Today's Vitals: BP 117/78   Pulse (!) 101   Temp 97.7 F (36.5 C) (Temporal)   Resp 16   Ht 5' (1.524 m)   Wt 165 lb 8 oz (75.1 kg)   SpO2 97%   BMI 32.32 kg/m   Physical Exam  Gen: WDWN NAD HEENT: NCAT, conjunctiva not injected, sclera nonicteric TM WNL B, OP moist, no exudates  NECK:  supple, no thyromegaly, no nodes, no carotid bruits CARDIAC: RRR, S1S2+, no murmur. DP 2+B LUNGS: CTAB. No wheezes ABDOMEN:  BS+, soft, NTND, No HSM, no masses EXT:  no edema MSK: no gross abnormalities.  NEURO: A&O x3.  CN II-XII intact.  PSYCH: normal mood. Good eye contact   PDMP-adderall xr 15mg -last filled 09/02/22  Assessment & Plan:  Essential hypertension Assessment & Plan: Chronic.  Controlled.  Continue amlodipine 10mg   Orders: -     Comprehensive metabolic panel -     CBC with Differential/Platelet -     TSH  Pure  hypercholesterolemia Assessment & Plan: Chronic.  Not sure of control. Continue atorvastatin 60mg   Orders: -     Lipid panel -     Comprehensive metabolic panel -  TSH  TIA (transient ischemic attack) Assessment & Plan: H/o.  Pt on statin, ASA 81mg  daily   Gastroesophageal reflux disease without esophagitis Assessment & Plan: Chronic.  Controlled.  Continue protonix 40mg  daily   Chronic gouty arthritis Assessment & Plan: Chronic.  Controlled off meds.  Low purine diet.  Not flares.  Will continue to monitor  Orders: -     Uric acid  Mild intermittent asthma without status asthmaticus without complication Assessment & Plan: Chronic  controlled on zyrtec 10mg , flonase, singulair 10mg .  Rare ventolin use   Leg cramp  Nonintractable episodic headache, unspecified headache type  Attention deficit disorder (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 Assessment & Plan: Chronic.  Taking adderall XR 15mg .  Advised I didn't want to rx but "need it, not doing well without it".  PDMP checked.   High risk medication use -     Magnesium -     Vitamin B12  HA-?from phone use  is positional as well.  Stretches, change positions Leg cramps-check labs.  Stretches.   Follow-up: Return in about 6 months (around 06/16/2023) for chronic follow-up.   Angelena Sole, MD

## 2022-12-15 NOTE — Assessment & Plan Note (Signed)
Chronic.  Controlled.  Continue amlodipine 10mg 

## 2022-12-15 NOTE — Assessment & Plan Note (Signed)
Chronic.  Not sure of control. Continue atorvastatin 60mg 

## 2022-12-15 NOTE — Assessment & Plan Note (Signed)
Chronic.  Controlled.  Continue protonix 40mg  daily

## 2022-12-22 ENCOUNTER — Encounter: Payer: Self-pay | Admitting: *Deleted

## 2022-12-30 ENCOUNTER — Other Ambulatory Visit: Payer: Self-pay | Admitting: Internal Medicine

## 2022-12-30 DIAGNOSIS — R053 Chronic cough: Secondary | ICD-10-CM

## 2022-12-30 DIAGNOSIS — J309 Allergic rhinitis, unspecified: Secondary | ICD-10-CM

## 2023-01-05 ENCOUNTER — Ambulatory Visit: Payer: Medicare Other

## 2023-01-30 NOTE — Progress Notes (Signed)
[facility] [address] [place]. [place] [address] [phone]       STROKE FOLLOW UP NOTE  Ms. Clifton Custard Date of Birth:  07/05/1950 Medical Record Number:  [id number]    Primary neurologist: Dr. Pearlean Brownie Reason for Referral: Stroke    No chief complaint on file.     HPI:   Update 01/31/2023 JM: Patient returns for follow-up visit.  At prior visit, discussed worsening headaches with mixed tension and migrainous features felt to be in setting of increased stress and decreased sleep.  Patient wished to avoid daily preventative medication, provided steroid taper pack. Held off on imaging as neuro exam intact and no red flag features, also with prior imaging form headaches in 11/2021 which was unremarkable.   Stable from stroke standpoint, denies new stroke/TIA symptoms.  Remains on aspirin and atorvastatin.  Routinely follows with PCP for stroke risk factor management.       History provided for reference purposes only Update 06/29/2022 JM: Patient is being seen for acute visit due to worsening headaches.  Reports over the past few weeks, she has been experiencing mild posterior headaches about every other day but has been experiencing a persistent headache over the past 3 days and today associated with light and noise sensitivity as well as mild nausea.  She has taken Tylenol for the past 3 days without any significant benefit.  Denies any other associated symptoms with current headache, denies any visual changes, speech changes, weakness/numbness, dizziness/imbalance or cognitive changes.  Denies worsening baseline position.  Does endorse increased stressors over the past few weeks with the holidays as well as poor sleep especially last night as they are watching their young grand daughter.  She also endorses some allergies and increased congestion.  Does have mild neck pain but nothing overly bothersome. Prior complaints of new onset headache at prior visit as well as  word finding difficulty and cognitive decline, completed MRI brain which did not show any new findings compared to prior imaging.  These headaches eventually resolved and denies any continued word finding difficulties and cognition has been stable.  Compliant on aspirin and atorvastatin Blood pressure 128/66  Update 12/09/2021 JM: Patient returns for follow-up visit after prior visit with Dr. Pearlean Brownie 6 months ago. Accompanied by her husband, [name]. At visit back in October, she had concerns of lower extremity tremor upon standing with dizziness.  Cervical imaging completed which showed degenerative changes but otherwise unremarkable.  She had follow-up with Dr. Pearlean Brownie 2 months later for further evaluation and fortunately, symptoms had resolved during the interval time.  Thankfully, she has not had any recurrence of these symptoms.  She is concerned regarding cognitive decline since prior visit.  She also notes difficulty with finding correct words at times or delayed word finding difficulties. Reports gradual decline, denies any abrupt worsening. Long term memory intact.  She does have history of ADHD previously on Adderall which was discontinued when she had her stroke, her PCP plans on restarting this.  She questions if untreated ADHD could be contributing to her memory loss.  At times can have issues with insomnia, has difficulty shutting her brain off.  Previously underwent sleep study (3-4 yrs ago) which was negative for apnea per patient (unable to view report).  Reports approximately 4 weeks ago, she was experiencing daily morning headaches that lasted for 1 week, located right side on top of head. Would take additional aspirin with resolution of headache.  Denies any photophobia, phonophobia, N/V or  any other neurological symptoms associated.  Does have history of migraines but "many years ago" and these headaches felt different from her prior migraines. She continues to maintain ADLs and majority of  IADLs independently.  Compliant on aspirin and atorvastatin, denies side effects.  Blood pressure today 155/60. Occasionally monitors at home, typically stable 130s/80s.   No further concerns at this time   Update 06/08/2021 Dr. Pearlean Brownie: She returns for follow-up after last visit with Shanda Bumps nurse practitioner on 04/19/2021.  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  Patient however states that since that visit the tremor has practically disappeared in a few weeks after that visit.  She has had no tremors in the last several months.  She states her short-term memory difficulties are about unchanged.  Memory loss is not progressive.  She does do regular cognitive exercises on the app [email or web address]  She also play some games on her phone.  She has had no recurrent stroke or TIA symptoms since her original stroke 2 years ago.  She remains on aspirin which is tolerating well without bleeding or bruising.  Her blood pressure is under good control today it is 131/84.  She is tolerating Lipitor well without muscle aches and pains.  She has no new complaints today.  She has upcoming annual physical exam due with primary care physician and will have follow-up lab work done at that visit.    Update 04/19/2021 JM: Returns for 74-month stroke follow-up accompanied by her husband, [name].  Overall stable without new stroke/TIA symptoms.  Reports continued short-term memory difficulties stable without worsening.  Maintains ADLs and IADLs independently.  Compliant on aspirin and atorvastatin without side effects.  Blood pressure today 138/87.   Reports over the past 6 months, she has experienced 2-3 episodes of right leg tremor while standing associated with dizziness sensation. Typically resolves after sitting or taking pressure off from leg. Reports approx 1 month ago, she experienced dizziness sensation upon standing and after  walking short distance (into kitchen) her legs started shaking bilaterally and then progressed to bilateral upper extremity tremor with difficulty holding on to a cup that was in her hands. This lasted approx 2-3 minutes. This led to a fall but thankfully without injury or hitting her head. She has not had any additional episodes since that time. She is scheduled in December with Dr. Pearlean Brownie for further evaluation  Routinely followed by [facility] for chronic low back pain - completed imaging a few months ago for worsening back pain - unable to view via epic. Mild neck pain/tightness but denies radiculopathy.  Recently diagnosed with shingles 5-6 weeks ago - residual nerve pain left side waist to shoulder - currently on gabapentin 300 mg 3 times daily -recent episode occurred prior to starting gabapentin.    Update 10/20/2020 JM: Mrs. Engelhardt returns for 93-month stroke follow-up accompanied by her husband.  Doing well since prior visit without new or reoccurring stroke/TIA symptoms.  She does report continued short-term memory issues but has been stable without worsening.  Continues to maintain ADLs and IADLs independently and routinely doing memory exercises.  Denies depression or anxiety.  Compliant on aspirin and atorvastatin without associated side effects.  Blood pressure today 116/73. Reports lab work by PCP completed in December with level satisfactory.  Routinely followed by [facility] for lower back and hip pain.  Reports recent left arm spasm upon reaching overhead only lasting for a couple  seconds without any reoccurring symptoms.  Denies shoulder or arm pain but does endorse occasional neck tightness sensation.  No further concerns at this time  Update 04/21/2020 JM: Ms. Aceituno returns for stroke follow-up accompanied by her husband. Stable since prior visit without residual deficits and denies new or reoccurring stroke/TIA symptoms. She does report some short term memory loss which has been present since  her stroke but also endorses multiple other stressors that may be contributing.  She will occasionally forget recent conversations, repeat a question or forget what she wants on television.  She is able to maintain ADLs and IADLs independently.  Short-term memory concerns have been stable without worsening and does do daily memory exercises such as crossword puzzles, sudoku and exercises on Lumosity.  Remains on aspirin and atorvastatin 60 mg daily for secondary stroke prevention. She did have cholesterol levels checked by PCP around July or August and plans on repeating in December at yearly physical. Blood pressure today 130/82.  She does not routinely monitor at home.  No further concerns at this time.  Initial visit 12/26/2019 Dr. Pearlean Brownie: Ms. Petrosyan is a pleasant 72 year old Caucasian lady is seen today for initial office consultation visit for stroke.  She is accompanied by her husband.  History is obtained from them as well as review of referral notes.  No actual imaging films available in PACS for review today.  She has past medical history of hypertension hyperlipidemia obesity and gout who developed sudden onset of right upper extremity paresthesias from elbow down as well as some gait ataxia and stumbling on 09/11/2019.  She was admitted to [facility] and [place].  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  MR angiogram of the neck was unremarkable hemoglobin A1c was normal at 5.3 and LDL was elevated at 161 mg percent.  Echocardiogram showed normal ejection fraction of 65%.  She was started on aspirin Plavix and a statin.  She is out of follow-up lipid profile checked by primary physician in 11/04/2019 which  had improved but was still elevated 84 mg percent.  Her dose of Lipitor was however not increased she is on aspirin Plavix and tolerating fairly well but does complain of increased bruising.  She underwent 2-week Holter monitor placed by outpatient cardiologist at [facility] which showed no evidence of significant cardiac arrhythmias.  She denies any history of syncope palpitations.  She states numbness and gait ataxia completely resolved within a day after admission.  She has had no recurrent stroke or TIA symptoms.  She states she is doing well and has no complaints today.  She has no prior history of strokes TIAs seizures or significant neurological problems.  No family history of strokes.   ROS:   14 system review of systems is positive for those listed in HPI and all other systems negative  PMH:  Past Medical History:  Diagnosis Date   ADD (attention deficit disorder)    Allergy    Anxiety    Asthma    Depression    Dermatitis    GERD (gastroesophageal reflux disease)    Heart murmur    Hyperlipidemia    Hypertension    Stroke [facility])     Social  History:  Social History   Socioeconomic History   Marital status: Married    Spouse name: Not on file   Number of children: 4   Years of education: Not on file   Highest education level: Not on file  Occupational History   Not on file  Tobacco Use   Smoking status: Never   Smokeless tobacco: Never  Vaping Use   Vaping status: Never Used  Substance and Sexual Activity   Alcohol use: Yes    Alcohol/week: 2.0 standard drinks of alcohol    Types: 1 Glasses of wine, 1 Shots of liquor per week    Comment: ocassionally   Drug use: Not Currently   Sexual activity: Yes  Other Topics Concern   Not on file  Social History Narrative   Retired Product/process development scientist 9   Social Determinants of Health   Financial Resource Strain: Not on file  Food Insecurity: Not on file  Transportation Needs: Not on file  Physical Activity: Not on file   Stress: Not on file  Social Connections: Unknown (11/09/2021)   Received from [facility]   Social Network    Social Network: Not on file  Intimate Partner Violence: Unknown (10/01/2021)   Received from [facility]   HITS    Physically Hurt: Not on file    Insult or Talk Down To: Not on file    Threaten Physical Harm: Not on file    Scream or Curse: Not on file    Medications:   Current Outpatient Medications on File Prior to Visit  Medication Sig Dispense Refill   albuterol (VENTOLIN HFA) 108 (90 Base) MCG/ACT inhaler every 4 (four) hours as needed.      amLODipine (NORVASC) 10 MG tablet amlodipine 10 mg tablet     aspirin 81 MG EC tablet Take by mouth once. Tablet     atorvastatin (LIPITOR) 40 MG tablet Take 60 mg by mouth daily.     B COMPLEX VITAMINS ER PO Take by mouth.     BIOTIN PO Take by mouth.     CALCIUM PO Take by mouth.     cetirizine (ZYRTEC) 10 MG tablet TAKE 1 TABLET BY MOUTH EVERY DAY. OTC NOT COVERED 90 tablet 3   Ergocalciferol 10 MCG (400 UNIT) TABS Take 1,000 mcg by mouth daily.     fluticasone (FLONASE) 50 MCG/ACT nasal spray Place 1 spray into both nostrils daily. 16 g 11   montelukast (SINGULAIR) 10 MG tablet Take 1 tablet (10 mg total) by mouth at bedtime. 30 tablet 11   Multiple Vitamin (MULTIVITAMIN) tablet Take 1 tablet by mouth daily.     pantoprazole (PROTONIX) 40 MG tablet Take 40 mg by mouth daily.     No current facility-administered medications on file prior to visit.    Allergies:   Allergies  Allergen Reactions   Doxycycline Nausea And Vomiting    Other Reaction(s): GI Intolerance   Morphine Other (See Comments) and Swelling    Adverse reaction  Other Reaction(s): decreased blood pressure, Hypotension, Other  Adverse reaction  Adverse reaction  Mental changes   Orphenadrine Other (See Comments) and Swelling    Makes BP drop  Other Reaction(s): Other  Makes BP drop    Makes BP drop  Other Reaction(s): decreased blood  pressure   Oxycodone-Acetaminophen Other (See Comments)    Other Reaction(s): hallucinations  Other Reaction(s): Delusions (intolerance), Hallucinations, Other, Psychosis   Augmentin [Amoxicillin-Pot Clavulanate] Nausea And Vomiting   Orphenadrine Citrate  Swelling    Other Reaction(s): Hypotension  Presyncope   Other      Physical Exam There were no vitals filed for this visit.  There is no height or weight on file to calculate BMI.  General: Obese very pleasant elderly Caucasian lady seated, in no evident distress Head: head normocephalic and atraumatic.   Neck: supple with no carotid or supraclavicular bruits Cardiovascular: regular rate and rhythm, no murmurs Musculoskeletal: no deformity Skin:  no rash/petichiae Vascular:  Normal pulses all extremities  Neurologic Exam Mental Status: Awake and fully alert.  Fluent speech and language.  Unable to appreciate aphasia during today's visit.  Oriented to place and time. Recent memory impaired and remote memory intact. Attention span, concentration and fund of knowledge appropriate during visit. Mood and affect appropriate.  Cranial Nerves: Pupils equal, briskly reactive to light. Extraocular movements full without nystagmus. Visual fields full to confrontation. Hearing intact. Facial sensation intact. Face, tongue, palate moves normally and symmetrically.  Motor: Normal bulk and tone. Normal strength in all tested extremity muscles. Sensory.: intact to touch , pinprick , position and vibratory sensation.  Coordination: Rapid alternating movements normal in all extremities. Finger-to-nose and heel-to-shin performed accurately bilaterally.  Gait and Station: Arises from chair without difficulty. Stance is normal. Gait demonstrates normal stride, and balance without use of assistive device.  Difficulty performing heel, toe and tandem walk Reflexes: 1+ and symmetric. Toes downgoing.     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 Vascular risk factors of pre-DM, hypertension, hyperlipidemia, intracranial stenosis and obesity. Back in 03/2021, reported 6 month onset of 2-3 episodes of intermittent RLE tremor while standing and in 02/2021, experienced BLE tremor which progressed to BUE tremor leading a fall lasting 2-3 minutes. These eventually resolved on their own.    At today's visit, reports cognitive decline, occasional word finding difficulty and headaches.    Acute onset headache Mix of tension and migrainous type features likely in setting of increased stress and decreased sleep Hx of headaches/migraines, previously completed MRI brain with prior headache back in June which was negative for acute findings Neuroexam today intact without any red flag features, can hold off on repeat imaging for now Recommend Medrol Dosepak She would like to avoid starting a daily preventative if able Advised to call if headaches persist, may consider initiating preventative therapy and/or imaging at that time Advised to call 911 immediately if she experiences any severe headache associated with N/V and/or neurological/stroke related symptoms  L PCA stroke Continue aspirin 81 mg daily and atorvastatin 80mg  daily for secondary stroke prevention  Discussed secondary stroke prevention measures and importance of close PCP follow-up to maintain strict control of hypertension with blood pressure goal below 130/90, prediabetes with A1c goal<7 and lipids with LDL cholesterol goal below 70 mg/dL.     Follow-up in 3 months or call earlier if needed    CC:  Jeani Sow, MD    I spent 31 minutes of face-to-face and non-face-to-face time with patient and husband.  This included previsit chart review, lab review, study review, order entry, electronic health record documentation,  patient and husband education and discussion regarding above diagnoses and treatment plan and answered all the questions to patient and husband satisfaction  Ihor Austin, [facility]  [facility] [address] [place], [place] [address]  Phone [phone] Fax [phone]  Note: This document was prepared with digital dictation and possible smart phrase technology. Any transcriptional errors that result from this process are unintentional.

## 2023-01-31 ENCOUNTER — Ambulatory Visit (INDEPENDENT_AMBULATORY_CARE_PROVIDER_SITE_OTHER): Payer: Medicare Other | Admitting: Adult Health

## 2023-01-31 ENCOUNTER — Encounter: Payer: Self-pay | Admitting: Adult Health

## 2023-01-31 VITALS — BP 122/70 | HR 85 | Ht 59.5 in | Wt 168.0 lb

## 2023-01-31 DIAGNOSIS — I63432 Cerebral infarction due to embolism of left posterior cerebral artery: Secondary | ICD-10-CM

## 2023-01-31 DIAGNOSIS — G44229 Chronic tension-type headache, not intractable: Secondary | ICD-10-CM | POA: Diagnosis not present

## 2023-01-31 NOTE — Patient Instructions (Addendum)
Your Plan:  Suspect worsening headaches in setting of increased stressors  Please let me know if headaches persist and interested in starting on daily preventative medication  Okay to use Tylenol as needed for headaches - please ensure you do not take more than 2-3 times per week as this can cause rebound headaches   Continue aspirin and atorvastatin and continue to follow with PCP for aggressive stroke risk factor management        Thank you for coming to see [place] at [facility]. I hope we have been able to provide you high quality care today.  You may receive a patient satisfaction survey over the next few weeks. We would appreciate your feedback and comments so that we may continue to improve ourselves and the health of our patients.

## 2023-03-30 ENCOUNTER — Other Ambulatory Visit: Payer: Self-pay | Admitting: Internal Medicine

## 2023-04-19 DIAGNOSIS — M25521 Pain in right elbow: Secondary | ICD-10-CM | POA: Insufficient documentation

## 2023-04-23 ENCOUNTER — Other Ambulatory Visit: Payer: Self-pay | Admitting: Internal Medicine

## 2023-05-23 ENCOUNTER — Other Ambulatory Visit: Payer: Self-pay | Admitting: Family Medicine

## 2023-05-23 DIAGNOSIS — M722 Plantar fascial fibromatosis: Secondary | ICD-10-CM | POA: Insufficient documentation

## 2023-05-23 DIAGNOSIS — Z1231 Encounter for screening mammogram for malignant neoplasm of breast: Secondary | ICD-10-CM

## 2023-05-24 ENCOUNTER — Other Ambulatory Visit: Payer: Self-pay | Admitting: Family Medicine

## 2023-05-24 DIAGNOSIS — M8580 Other specified disorders of bone density and structure, unspecified site: Secondary | ICD-10-CM

## 2023-06-17 ENCOUNTER — Encounter (HOSPITAL_BASED_OUTPATIENT_CLINIC_OR_DEPARTMENT_OTHER): Payer: Self-pay

## 2023-06-17 ENCOUNTER — Other Ambulatory Visit: Payer: Self-pay

## 2023-06-17 ENCOUNTER — Emergency Department (HOSPITAL_BASED_OUTPATIENT_CLINIC_OR_DEPARTMENT_OTHER): Payer: Medicare Other

## 2023-06-17 ENCOUNTER — Emergency Department (HOSPITAL_BASED_OUTPATIENT_CLINIC_OR_DEPARTMENT_OTHER)
Admission: EM | Admit: 2023-06-17 | Discharge: 2023-06-17 | Disposition: A | Discharge status: Home / Self Care | Payer: Medicare Other | Attending: Emergency Medicine | Admitting: Emergency Medicine

## 2023-06-17 DIAGNOSIS — J45909 Unspecified asthma, uncomplicated: Secondary | ICD-10-CM | POA: Diagnosis not present

## 2023-06-17 DIAGNOSIS — S0993XA Unspecified injury of face, initial encounter: Secondary | ICD-10-CM | POA: Diagnosis present

## 2023-06-17 DIAGNOSIS — Z79899 Other long term (current) drug therapy: Secondary | ICD-10-CM | POA: Diagnosis not present

## 2023-06-17 DIAGNOSIS — I10 Essential (primary) hypertension: Secondary | ICD-10-CM | POA: Insufficient documentation

## 2023-06-17 DIAGNOSIS — W19XXXA Unspecified fall, initial encounter: Secondary | ICD-10-CM | POA: Diagnosis not present

## 2023-06-17 DIAGNOSIS — S0083XA Contusion of other part of head, initial encounter: Secondary | ICD-10-CM | POA: Diagnosis not present

## 2023-06-17 DIAGNOSIS — Z7982 Long term (current) use of aspirin: Secondary | ICD-10-CM | POA: Insufficient documentation

## 2023-06-17 DIAGNOSIS — Z7951 Long term (current) use of inhaled steroids: Secondary | ICD-10-CM | POA: Diagnosis not present

## 2023-06-17 NOTE — Discharge Instructions (Addendum)
Scans are normal without any sign of internal bleeding or broken bones.  You can continue to take Tylenol as needed.  If you start having persistent vomiting or confusion return to the emergency room.  Avoid eyestrain like reading fine print until your symptoms resolve.

## 2023-06-17 NOTE — ED Provider Notes (Signed)
[place] EMERGENCY DEPARTMENT AT [facility] Provider Note   CSN: [id number] Arrival date & time: 06/17/23  7829     History  Chief Complaint  Patient presents with   Marie Walker is a 72 y.o. female.  Patient is a 72 year old female with a history of hypertension, stroke, asthma, hyperlipidemia, GERD who is presenting today for evaluation after a fall she had on Thursday.  She reports that it was 3 AM in the morning and she had gotten up to go to the bathroom and was coming back to the [facility]rum which has a higher bed than what she is used to.  She remembers putting her hands on the bed but she somehow slipped falling down to the floor hitting the right side of her face on the floor.  She did not have any loss of consciousness.  Initially felt that she was okay yesterday but then today noticed there was more bruising around her eye and she felt a little" funny".  She describes that it is just being a little bit out of focus and not feeling 100%.  Minimal headache.  Also some pain over in the right side of her neck and shoulder.  She has had no difficulty walking.  Denies any nausea or vomiting.  She denies any weakness on one side of her body.  No loss of vision.  She does not take anticoagulation other than a baby aspirin.  The history is provided by the patient.  Fall       Home Medications Prior to Admission medications   Medication Sig Start Date End Date Taking? Authorizing Provider  albuterol (VENTOLIN HFA) 108 (90 Base) MCG/ACT inhaler every 4 (four) hours as needed.  11/05/19   [provider]  amLODipine (NORVASC) 10 MG tablet amlodipine 10 mg tablet 09/03/19   [provider]  aspirin 81 MG EC tablet Take by mouth once. Tablet 09/01/19   [provider]  atorvastatin (LIPITOR) 40 MG tablet Take 60 mg by mouth daily.    [provider]  B COMPLEX VITAMINS ER PO Take by mouth.    [provider]  BIOTIN PO Take by  mouth.    [provider]  CALCIUM PO Take by mouth.    [provider]  cetirizine (ZYRTEC) 10 MG tablet TAKE 1 TABLET BY MOUTH EVERY DAY. OTC NOT COVERED 12/30/22   Charlott Holler, MD  Ergocalciferol 10 MCG (400 UNIT) TABS Take 1,000 mcg by mouth daily. 01/29/21   [provider]  fluticasone (FLONASE) 50 MCG/ACT nasal spray Place 1 spray into both nostrils daily. 01/31/22   Charlott Holler, MD  montelukast (SINGULAIR) 10 MG tablet TAKE 1 TABLET BY MOUTH EVERYDAY AT BEDTIME 03/31/23   Charlott Holler, MD  Multiple Vitamin (MULTIVITAMIN) tablet Take 1 tablet by mouth daily.    [provider]  pantoprazole (PROTONIX) 40 MG tablet Take 40 mg by mouth daily. 12/19/19   [provider]  sertraline (ZOLOFT) 25 MG tablet Take 25 mg by mouth daily. 01/06/23   [provider]      Allergies    Doxycycline, Morphine, Orphenadrine, Oxycodone-acetaminophen, Augmentin [amoxicillin-pot clavulanate], Orphenadrine citrate, and Other    Review of Systems   Review of Systems  Physical Exam Updated Vital Signs BP (!) 164/78 (BP Location: Right Arm)   Pulse 94   Temp 98.7 F (37.1 C) (Oral)   Resp 16   Ht 4\' 11"  (1.499 m)  Wt 74.8 kg   SpO2 98%   BMI 33.33 kg/m  Physical Exam Vitals and nursing note reviewed.  Constitutional:      General: She is not in acute distress.    Appearance: She is well-developed.  HENT:     Head: Normocephalic. Contusion present.     Comments: Ecchymosis around the right eye and on the side of the right temple Eyes:     Extraocular Movements: Extraocular movements intact.     Conjunctiva/sclera: Conjunctivae normal.     Pupils: Pupils are equal, round, and reactive to light.  Neck:   Cardiovascular:     Rate and Rhythm: Normal rate.  Pulmonary:     Effort: Pulmonary effort is normal. No respiratory distress.  Musculoskeletal:        General: Normal range of motion.     Cervical back: Tenderness present. Spinous  process tenderness and muscular tenderness present.     Comments: No edema  Skin:    General: Skin is warm and dry.     Findings: No rash.  Neurological:     General: No focal deficit present.     Mental Status: She is alert and oriented to person, place, and time. Mental status is at baseline.     Cranial Nerves: No cranial nerve deficit.     Sensory: No sensory deficit.     Motor: No weakness.     Coordination: Coordination normal.     Gait: Gait normal.  Psychiatric:        Behavior: Behavior normal.     ED Results / Procedures / Treatments   Labs (all labs ordered are listed, but only abnormal results are displayed) Labs Reviewed - No data to display  EKG None  Radiology CT Head Wo Contrast Result Date: 06/17/2023 CLINICAL DATA:  Blunt facial trauma. Mechanical fall from standing 2 nights ago. Intermittent lightheadedness. EXAM: CT HEAD WITHOUT CONTRAST CT MAXILLOFACIAL WITHOUT CONTRAST CT CERVICAL SPINE WITHOUT CONTRAST TECHNIQUE: Multidetector CT imaging of the head, cervical spine, and maxillofacial structures were performed using the standard protocol without intravenous contrast. Multiplanar CT image reconstructions of the cervical spine and maxillofacial structures were also generated. RADIATION DOSE REDUCTION: This exam was performed according to the departmental dose-optimization program which includes automated exposure control, adjustment of the mA and/or kV according to patient size and/or use of iterative reconstruction technique. COMPARISON:  12/22/2021 brain MRI FINDINGS: CT HEAD FINDINGS Brain: No evidence of acute infarction, hemorrhage, hydrocephalus, extra-axial collection or mass lesion/mass effect. Vascular: No hyperdense vessel or unexpected calcification. Skull: Normal. Negative for fracture or focal lesion. CT MAXILLOFACIAL FINDINGS Osseous: No fracture or mandibular dislocation. No destructive process. Orbits: Negative. No traumatic or inflammatory finding.  Sinuses: Clear. Soft tissues: Negative. CT CERVICAL SPINE FINDINGS Alignment: Normal. Skull base and vertebrae: No acute fracture. No primary bone lesion or focal pathologic process. Soft tissues and spinal canal: No prevertebral fluid or swelling. No visible canal hematoma. Disc levels:  Ordinary and generalized degenerative spurring. Upper chest: No acute finding IMPRESSION: No evidence of acute intracranial or cervical spine injury. Negative for facial fracture. Electronically Signed   By: Tiburcio Pea M.D.   On: 06/17/2023 10:43   CT Cervical Spine Wo Contrast Result Date: 06/17/2023 CLINICAL DATA:  Blunt facial trauma. Mechanical fall from standing 2 nights ago. Intermittent lightheadedness. EXAM: CT HEAD WITHOUT CONTRAST CT MAXILLOFACIAL WITHOUT CONTRAST CT CERVICAL SPINE WITHOUT CONTRAST TECHNIQUE: Multidetector CT imaging of the head, cervical spine, and maxillofacial structures were performed using the  standard protocol without intravenous contrast. Multiplanar CT image reconstructions of the cervical spine and maxillofacial structures were also generated. RADIATION DOSE REDUCTION: This exam was performed according to the departmental dose-optimization program which includes automated exposure control, adjustment of the mA and/or kV according to patient size and/or use of iterative reconstruction technique. COMPARISON:  12/22/2021 brain MRI FINDINGS: CT HEAD FINDINGS Brain: No evidence of acute infarction, hemorrhage, hydrocephalus, extra-axial collection or mass lesion/mass effect. Vascular: No hyperdense vessel or unexpected calcification. Skull: Normal. Negative for fracture or focal lesion. CT MAXILLOFACIAL FINDINGS Osseous: No fracture or mandibular dislocation. No destructive process. Orbits: Negative. No traumatic or inflammatory finding. Sinuses: Clear. Soft tissues: Negative. CT CERVICAL SPINE FINDINGS Alignment: Normal. Skull base and vertebrae: No acute fracture. No primary bone lesion or  focal pathologic process. Soft tissues and spinal canal: No prevertebral fluid or swelling. No visible canal hematoma. Disc levels:  Ordinary and generalized degenerative spurring. Upper chest: No acute finding IMPRESSION: No evidence of acute intracranial or cervical spine injury. Negative for facial fracture. Electronically Signed   By: Tiburcio Pea M.D.   On: 06/17/2023 10:43   CT Maxillofacial Wo Contrast Result Date: 06/17/2023 CLINICAL DATA:  Blunt facial trauma. Mechanical fall from standing 2 nights ago. Intermittent lightheadedness. EXAM: CT HEAD WITHOUT CONTRAST CT MAXILLOFACIAL WITHOUT CONTRAST CT CERVICAL SPINE WITHOUT CONTRAST TECHNIQUE: Multidetector CT imaging of the head, cervical spine, and maxillofacial structures were performed using the standard protocol without intravenous contrast. Multiplanar CT image reconstructions of the cervical spine and maxillofacial structures were also generated. RADIATION DOSE REDUCTION: This exam was performed according to the departmental dose-optimization program which includes automated exposure control, adjustment of the mA and/or kV according to patient size and/or use of iterative reconstruction technique. COMPARISON:  12/22/2021 brain MRI FINDINGS: CT HEAD FINDINGS Brain: No evidence of acute infarction, hemorrhage, hydrocephalus, extra-axial collection or mass lesion/mass effect. Vascular: No hyperdense vessel or unexpected calcification. Skull: Normal. Negative for fracture or focal lesion. CT MAXILLOFACIAL FINDINGS Osseous: No fracture or mandibular dislocation. No destructive process. Orbits: Negative. No traumatic or inflammatory finding. Sinuses: Clear. Soft tissues: Negative. CT CERVICAL SPINE FINDINGS Alignment: Normal. Skull base and vertebrae: No acute fracture. No primary bone lesion or focal pathologic process. Soft tissues and spinal canal: No prevertebral fluid or swelling. No visible canal hematoma. Disc levels:  Ordinary and generalized  degenerative spurring. Upper chest: No acute finding IMPRESSION: No evidence of acute intracranial or cervical spine injury. Negative for facial fracture. Electronically Signed   By: Tiburcio Pea M.D.   On: 06/17/2023 10:43    Procedures Procedures    Medications Ordered in ED Medications - No data to display  ED Course/ Medical Decision Making/ A&P                                 Medical Decision Making Amount and/or Complexity of Data Reviewed Radiology: ordered and independent interpretation performed. Decision-making details documented in ED Course.   Pt with multiple medical problems and comorbidities and presenting today with a complaint that caries a high risk for morbidity and mortality.  Here today after evaluation of a fall a day and a half ago.  Patient does not take any anticoagulation but is having some mild symptoms.  Will rule out intracranial hemorrhage versus concussion.  Neurovascularly intact at this time.  No evidence of obvious ocular trauma.  I have independently visualized and interpreted pt's images today.  CT  head is negative for intracranial hemorrhage.  CT of cervical spine and face are negative for fracture.  Radiology reports no acute abnormalities in the head, neck or face.  Findings discussed with the patient.  She was given concussion precautions and return precautions.  She and her husband are comfortable with this plan.  No social determinants affecting her discharge today.         Final Clinical Impression(s) / ED Diagnoses Final diagnoses:  Facial contusion, initial encounter  Fall, initial encounter    Rx / DC Orders ED Discharge Orders     None         Gwyneth Sprout, MD 06/17/23 1110

## 2023-06-17 NOTE — ED Triage Notes (Signed)
Patient presents to ED with c/o mechanical fall from standing two nights ago. Patient reports feeling more pressure in her head. +intermittent lightheadedness. Denies headache, dizziness, numbness, tingling. Ambulatory with steady gait.

## 2023-07-02 ENCOUNTER — Encounter (HOSPITAL_COMMUNITY): Payer: Self-pay

## 2023-07-02 ENCOUNTER — Ambulatory Visit (HOSPITAL_COMMUNITY)
Admission: RE | Admit: 2023-07-02 | Discharge: 2023-07-02 | Disposition: A | Discharge status: Home / Self Care | Payer: Medicare Other | Source: Ambulatory Visit

## 2023-07-02 VITALS — BP 122/73 | HR 89 | Temp 98.5°F | Resp 18 | Ht 59.0 in | Wt 164.9 lb

## 2023-07-02 DIAGNOSIS — J069 Acute upper respiratory infection, unspecified: Secondary | ICD-10-CM

## 2023-07-02 MED ORDER — BENZONATATE 200 MG PO CAPS: 200.0000 mg | ORAL_CAPSULE | Freq: Three times a day (TID) | ORAL | 0 refills | Status: DC

## 2023-07-02 MED ORDER — AZITHROMYCIN 250 MG PO TABS: 250.0000 mg | ORAL_TABLET | Freq: Every day | ORAL | 0 refills | Status: DC

## 2023-07-02 NOTE — ED Provider Notes (Signed)
 [facility]    CSN: [id number] Arrival date & time: 07/02/23  1516      History   Chief Complaint Chief Complaint  Patient presents with   Appointment   Cough    HPI Marie Walker is a 73 y.o. female.   Patient presents to clinic for a continued cough and congestion that have been persistent over the past 3 weeks.  Initially she was exposed to a sick grandchild who is about 46-1/2 years old, husband was also exposed and has since recovered.  Presents to clinic today because she feels like her cough is becoming worse.  It is productive with yellow sputum.  She does have a history of asthma and bronchitis.  She has tried Mucinex and Coricidin with only mild relief.  Does not think she has had any fevers.  No nausea, vomiting or diarrhea.  Endorses sore throat.  The history is provided by the patient and medical records.  Cough   Past Medical History:  Diagnosis Date   ADD (attention deficit disorder)    Allergy    Anxiety    Asthma    Depression    Dermatitis    GERD (gastroesophageal reflux disease)    Heart murmur    Hyperlipidemia    Hypertension    Stroke [facility])     Patient Active Problem List   Diagnosis Date Noted   Asymptomatic microscopic hematuria 12/15/2022   Acute gout involving toe of left foot 06/08/2021   Asthma without status asthmaticus 06/08/2021   Attention deficit disorder (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 06/08/2021   Chronic gouty arthritis 06/08/2021   Essential hypertension 06/08/2021   Fibromyalgia 06/08/2021   Gastroesophageal reflux disease without esophagitis 06/08/2021   Generalized anxiety disorder 06/08/2021   Gout attack 06/08/2021   Hearing loss 06/08/2021   Muscular fasciculation 06/08/2021   Obesity 06/08/2021   Otitis externa 06/08/2021   Primary localized osteoarthritis of pelvic region and thigh 06/08/2021   Pure hypercholesterolemia 06/08/2021   Seasonal allergic rhinitis 06/08/2021   TIA (transient ischemic attack) 01/29/2021     Past Surgical History:  Procedure Laterality Date   BREAST BIOPSY  1980   REPLACEMENT TOTAL KNEE BILATERAL     right-2019, left-2009   right foot fracture      TONSILLECTOMY  1954    OB History   No obstetric history on file.      Home Medications    Prior to Admission medications   Medication Sig Start Date End Date Taking? Authorizing Provider  ADDERALL XR 15 MG 24 hr capsule 1 capsule in the morning Orally Once a day for 90 days 02/28/23  Yes [provider]  amLODipine (NORVASC) 10 MG tablet amlodipine 10 mg tablet 09/03/19  Yes [provider]  aspirin 81 MG EC tablet Take by mouth once. Tablet 09/01/19  Yes [provider]  atorvastatin  (LIPITOR) 40 MG tablet Take 60 mg by mouth daily.   Yes [provider]  azithromycin  (ZITHROMAX ) 250 MG tablet Take 1 tablet (250 mg total) by mouth daily. Take first 2 tablets together, then 1 every day until finished. 07/02/23  Yes Karolynn Infantino  N, FNP  B COMPLEX VITAMINS ER PO Take by mouth.   Yes [provider]  benzonatate  (TESSALON ) 200 MG capsule Take 1 capsule (200 mg total) by mouth every 8 (eight) hours. 07/02/23  Yes Dreama, Leilene Diprima  N, FNP  CALCIUM  PO Take by mouth.   Yes [provider]  cetirizine  (ZYRTEC ) 10 MG tablet  TAKE 1 TABLET BY MOUTH EVERY DAY. OTC NOT COVERED 12/30/22  Yes Desai, Nikita S, MD  Ergocalciferol 10 MCG (400 UNIT) TABS Take 1,000 mcg by mouth daily. 01/29/21  Yes [provider]  fluticasone  (FLONASE ) 50 MCG/ACT nasal spray Place 1 spray into both nostrils daily. 01/31/22  Yes Desai, Nikita S, MD  Magnesium 250 MG TABS 1 tablet with a meal Orally Once a day   Yes [provider]  montelukast  (SINGULAIR ) 10 MG tablet TAKE 1 TABLET BY MOUTH EVERYDAY AT BEDTIME 03/31/23  Yes Desai, Nikita S, MD  Multiple Vitamin (MULTIVITAMIN) tablet Take 1 tablet by mouth daily.   Yes [provider]  pantoprazole (PROTONIX) 40 MG tablet Take 40 mg by  mouth daily. 12/19/19  Yes [provider]  albuterol (VENTOLIN HFA) 108 (90 Base) MCG/ACT inhaler every 4 (four) hours as needed.  11/05/19   [provider]  hydrOXYzine (ATARAX) 25 MG tablet 1/2-1 tablet Orally every 8 hours as needed for anxiety or itching    [provider]  methocarbamol (ROBAXIN) 500 MG tablet Take 500 mg by mouth 3 (three) times daily as needed. 03/08/23   [provider]  traMADol (ULTRAM) 50 MG tablet Take 50 mg by mouth 3 (three) times daily as needed. 05/02/23   [provider]    Family History Family History  Problem Relation Age of Onset   Hypertension Mother    Hyperlipidemia Mother    Hearing loss Mother    Cancer Mother    Arthritis Mother    Heart attack Mother    Stroke Father    Heart attack Father    Hearing loss Father    Cancer Father    Alzheimer's disease Father    Depression Sister    Stroke Brother    Hypertension Brother    Arthritis Brother    Depression Brother    Heart attack Maternal Grandmother    Hearing loss Maternal Grandfather    Cancer Paternal Grandmother    Hearing loss Paternal Grandmother    Alcohol abuse Paternal Grandmother    Vision loss Paternal Grandmother    Early death Paternal Grandfather    Lung disease Neg Hx     Social History Social History   Tobacco Use   Smoking status: Never   Smokeless tobacco: Never  Vaping Use   Vaping status: Never Used  Substance Use Topics   Alcohol use: Yes    Alcohol/week: 2.0 standard drinks of alcohol    Types: 1 Glasses of wine, 1 Shots of liquor per week    Comment: ocassionally   Drug use: Not Currently     Allergies   Doxycycline, Morphine, Orphenadrine, Oxycodone-acetaminophen, Augmentin [amoxicillin-pot clavulanate], Orphenadrine citrate, and Other   Review of Systems Review of Systems  Per HPI   Physical Exam Triage Vital Signs ED Triage Vitals  Encounter Vitals Group     BP 07/02/23 1537 122/73      Systolic BP Percentile --      Diastolic BP Percentile --      Pulse Rate 07/02/23 1537 89     Resp 07/02/23 1537 18     Temp 07/02/23 1537 98.5 F (36.9 C)     Temp Source 07/02/23 1537 Oral     SpO2 07/02/23 1537 96 %     Weight 07/02/23 1537 164 lb 14.5 oz (74.8 kg)     Height 07/02/23 1537 4' 11 (1.499 m)     Head Circumference --  Peak Flow --      Pain Score 07/02/23 1534 0     Pain Loc --      Pain Education --      Exclude from Growth Chart --    No data found.  Updated Vital Signs BP 122/73 (BP Location: Left Wrist)   Pulse 89   Temp 98.5 F (36.9 C) (Oral)   Resp 18   Ht 4' 11 (1.499 m)   Wt 164 lb 14.5 oz (74.8 kg)   SpO2 96%   BMI 33.31 kg/m   Visual Acuity Right Eye Distance:   Left Eye Distance:   Bilateral Distance:    Right Eye Near:   Left Eye Near:    Bilateral Near:     Physical Exam Vitals and nursing note reviewed.  Constitutional:      Appearance: Normal appearance.  HENT:     Head: Normocephalic and atraumatic.     Right Ear: External ear normal.     Left Ear: External ear normal.     Nose: Congestion and rhinorrhea present.     Mouth/Throat:     Mouth: Mucous membranes are moist.     Pharynx: Posterior oropharyngeal erythema present.  Eyes:     Conjunctiva/sclera: Conjunctivae normal.  Cardiovascular:     Rate and Rhythm: Normal rate and regular rhythm.     Heart sounds: Normal heart sounds. No murmur heard. Pulmonary:     Effort: Pulmonary effort is normal. No respiratory distress.     Breath sounds: Normal breath sounds.  Skin:    General: Skin is warm and dry.  Neurological:     General: No focal deficit present.     Mental Status: She is alert and oriented to person, place, and time.  Psychiatric:        Mood and Affect: Mood normal.        Behavior: Behavior normal.      UC Treatments / Results  Labs (all labs ordered are listed, but only abnormal results are displayed) Labs Reviewed - No data to  display  EKG   Radiology No results found.  Procedures Procedures (including critical care time)  Medications Ordered in UC Medications - No data to display  Initial Impression / Assessment and Plan / UC Course  I have reviewed the triage vital signs and the nursing notes.  Pertinent labs & imaging results that were available during my care of the patient were reviewed by me and considered in my medical decision making (see chart for details).  Vitals and triage reviewed, patient is hemodynamically stable.  Lungs are vesicular, heart with regular rate and rhythm.  Congestion and rhinorrhea present on physical exam.  Due to symptom duration will cover with azithromycin  for bacterial URI.  Cough management reviewed.  Discussed that with vesicular lung sounds, good oxygenation and no fevers that we will defer chest x-ray and azithromycin  will cover for atypical pathogens.  Plan of care, follow-up care return precautions given, no questions at this time.     Final Clinical Impressions(s) / UC Diagnoses   Final diagnoses:  Upper respiratory tract infection, unspecified type     Discharge Instructions      Your [facility] is open and closes at 5 PM.  Take the antibiotics with food as prescribed and until finished.  You can use the cough medicine every 8 hours.  Continue with 1200 mg of Mucinex daily to help loosen up secretions.  Ensure you are drinking plenty of water  and you can also sleep with a humidifier.  Your symptoms should improve with these medications, if no improvement or any changes follow-up with your primary care provider or return to clinic for further evaluation.    ED Prescriptions     Medication Sig Dispense Auth. Provider   azithromycin  (ZITHROMAX ) 250 MG tablet Take 1 tablet (250 mg total) by mouth daily. Take first 2 tablets together, then 1 every day until finished. 6 tablet Dreama, Yancy Hascall  N, FNP   benzonatate  (TESSALON ) 200 MG capsule Take 1 capsule (200 mg  total) by mouth every 8 (eight) hours. 30 capsule Dreama, Patton Swisher  N, FNP      PDMP not reviewed this encounter.   Dreama, Estie Sproule  N, FNP 07/02/23 1601

## 2023-07-02 NOTE — Discharge Instructions (Addendum)
 [facility] is open and closes at 5 PM.  Take the antibiotics with food as prescribed and until finished.  You can use the cough medicine every 8 hours.  Continue with 1200 mg of Mucinex daily to help loosen up secretions.  Ensure you are drinking plenty of water and you can also sleep with a humidifier.  Your symptoms should improve with these medications, if no improvement or any changes follow-up with your primary care provider or return to clinic for further evaluation.

## 2023-07-02 NOTE — ED Triage Notes (Signed)
 SORE THROAT, COUGH Also Nasal congestion and possible chest congestion - Entered by patient  Onset 3 weeks ago and getting worse. States husband was sick but he is better. States their grandchild was sick first with a bad cold. Patient has history of asthma and bronchitis but no flares in a long time.   Patient tried Mucinex DM, Coricidin with mild relief.

## 2023-07-03 ENCOUNTER — Ambulatory Visit (HOSPITAL_BASED_OUTPATIENT_CLINIC_OR_DEPARTMENT_OTHER): Payer: Medicare Other | Admitting: Physical Therapy

## 2023-07-18 ENCOUNTER — Telehealth: Payer: Self-pay | Admitting: Family Medicine

## 2023-07-18 NOTE — Telephone Encounter (Signed)
Good Afternoon would you mind removing Dr Ruthine Dose from this patients PCP.  She no longer is seen here at [facility].   Thank you  Gabriel [facility] TEAM Direct Dial [phone]

## 2023-08-01 ENCOUNTER — Encounter (HOSPITAL_BASED_OUTPATIENT_CLINIC_OR_DEPARTMENT_OTHER): Payer: Self-pay | Admitting: Physical Therapy

## 2023-08-01 ENCOUNTER — Ambulatory Visit (HOSPITAL_BASED_OUTPATIENT_CLINIC_OR_DEPARTMENT_OTHER): Payer: Medicare Other | Attending: Orthopedic Surgery | Admitting: Physical Therapy

## 2023-08-01 DIAGNOSIS — M25552 Pain in left hip: Secondary | ICD-10-CM | POA: Insufficient documentation

## 2023-08-01 DIAGNOSIS — M7052 Other bursitis of knee, left knee: Secondary | ICD-10-CM | POA: Insufficient documentation

## 2023-08-01 DIAGNOSIS — M6281 Muscle weakness (generalized): Secondary | ICD-10-CM | POA: Diagnosis not present

## 2023-08-01 DIAGNOSIS — R29898 Other symptoms and signs involving the musculoskeletal system: Secondary | ICD-10-CM | POA: Diagnosis not present

## 2023-08-01 DIAGNOSIS — R2689 Other abnormalities of gait and mobility: Secondary | ICD-10-CM | POA: Insufficient documentation

## 2023-08-01 NOTE — Therapy (Signed)
 OUTPATIENT PHYSICAL THERAPY LOWER EXTREMITY EVALUATION   Patient Name: Marie Walker MRN: [id number] DOB:[birth year], 73 y.o., female Today's Date: 08/01/2023  END OF SESSION:  PT End of Session - 08/01/23 1109     Visit Number 1    Number of Visits 16    Date for PT Re-Evaluation 09/26/23    Authorization Type Medicare    Progress Note Due on Visit 10    PT Start Time 1108    PT Stop Time 1146    PT Time Calculation (min) 38 min    Activity Tolerance Patient tolerated treatment well    Behavior During Therapy WFL for tasks assessed/performed             Past Medical History:  Diagnosis Date   ADD (attention deficit disorder)    Allergy    Anxiety    Asthma    Depression    Dermatitis    GERD (gastroesophageal reflux disease)    Heart murmur    Hyperlipidemia    Hypertension    Stroke [facility])    Past Surgical History:  Procedure Laterality Date   BREAST BIOPSY  1980   REPLACEMENT TOTAL KNEE BILATERAL     right-2019, left-2009   right foot fracture      TONSILLECTOMY  1954   Patient Active Problem List   Diagnosis Date Noted   Asymptomatic microscopic hematuria 12/15/2022   Acute gout involving toe of left foot 06/08/2021   Asthma without status asthmaticus 06/08/2021   Attention deficit disorder (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 06/08/2021   Chronic gouty arthritis 06/08/2021   Essential hypertension 06/08/2021   Fibromyalgia 06/08/2021   Gastroesophageal reflux disease without esophagitis 06/08/2021   Generalized anxiety disorder 06/08/2021   Gout attack 06/08/2021   Hearing loss 06/08/2021   Muscular fasciculation 06/08/2021   Obesity 06/08/2021   Otitis externa 06/08/2021   Primary localized osteoarthritis of pelvic region and thigh 06/08/2021   Pure hypercholesterolemia 06/08/2021   Seasonal allergic rhinitis 06/08/2021   TIA (transient ischemic attack) 01/29/2021    PCP: No PCP  REFERRING PROVIDER: Fidel Rogue, MD  REFERRING DIAG: M70.52 (ICD-10-CM) - Other  bursitis of knee, left knee - Pes anserinus bursitis of L knee; Patient stating left hip bursitis is actually the issue.   THERAPY DIAG:  Muscle weakness (generalized)  Pain in left hip  Other abnormalities of gait and mobility  Other symptoms and signs involving the musculoskeletal system  Rationale for Evaluation and Treatment: Rehabilitation  ONSET DATE: chronic   SUBJECTIVE:   SUBJECTIVE STATEMENT: Patient states left hip bursitis causing referred pain down leg. Stroke affected LLE. Has had a shot which has helped in the past but did not help recently. Pain increases with walking. Pain after sitting playing the piano for too long. Hip is affecting the back too. Has to wear back brace with playing the piano.   PERTINENT HISTORY: Hx Anxiety/depression, HLD, HTN, hx CVA affecting LLE PAIN:  Are you having pain? No  PRECAUTIONS: None  WEIGHT BEARING RESTRICTIONS: No  FALLS:  Has patient fallen in last 6 months? Yes. Number of falls 1  PLOF: Independent  PATIENT GOALS: want to be able to walk without pain, play piano without pain  OBJECTIVE: (objective measures from initial evaluation unless otherwise dated)  PATIENT SURVEYS: LEFS 53/80  COGNITION: Overall cognitive status: Within functional limits for tasks assessed     SENSATION: WFL   PALPATION:TTP L glutes, greater troch, lower L lumbar paraspinals  LOWER EXTREMITY ROM: [facility]  for tasks assessed  Active ROM Right eval Left eval  Hip flexion    Hip extension    Hip abduction    Hip adduction    Hip internal rotation    Hip external rotation    Knee flexion    Knee extension    Ankle dorsiflexion    Ankle plantarflexion    Ankle inversion    Ankle eversion     (Blank rows = not tested) *= pain/symptoms  LOWER EXTREMITY MMT:  MMT Right eval Left eval  Hip flexion 5 4  Hip extension 4 4-  Hip abduction 4- 4-  Hip adduction    Hip internal rotation    Hip external rotation    Knee flexion 5 5   Knee extension 4 4+  Ankle dorsiflexion    Ankle plantarflexion    Ankle inversion    Ankle eversion     (Blank rows = not tested) *= pain/symptoms  FUNCTIONAL TESTS:  5 times sit to stand: 10.54 seconds without UE support, impaired eccentric control, mild dynamic knee valgus Stairs: 7 inch alternating, no UE support, decreased concentric and eccentric strength  GAIT: Distance walked: 100 feet Assistive device utilized: None Level of assistance: Complete Independence Comments: WFL   TODAY'S TREATMENT:                                                                                                                              DATE:  08/01/23 Eval and HEP    PATIENT EDUCATION:  Education details: Patient educated on exam findings, POC, scope of PT, HEP, anatomy, and exercise mechanics. Person educated: Patient Education method: Explanation, Demonstration, and Handouts Education comprehension: verbalized understanding, returned demonstration, verbal cues required, and tactile cues required  HOME EXERCISE PROGRAM: Access Code: [email or web address] URL: [email or web address]  ASSESSMENT:  CLINICAL IMPRESSION: Patient a 73 y.o. y.o. female who was seen today for physical therapy evaluation and treatment for L hip bursitis. Patient presents with pain limited deficits in L hip strength, ROM, endurance, activity tolerance, and functional mobility with ADL. Patient is having to modify and restrict ADL as indicated by outcome measure score as well as subjective information and objective measures which is affecting overall participation. Patient will benefit from skilled physical therapy in order to improve function and reduce impairment.  OBJECTIVE IMPAIRMENTS: Abnormal gait, decreased activity tolerance, decreased balance, decreased mobility, difficulty walking, decreased strength, impaired flexibility, improper body mechanics, and pain.   ACTIVITY LIMITATIONS: carrying, lifting,  bending, standing, squatting, stairs, transfers, hygiene/grooming, locomotion level, and caring for others  PARTICIPATION LIMITATIONS: meal prep, cleaning, laundry, shopping, community activity, and yard work  PERSONAL FACTORS: 3+ comorbidities: Hx Anxiety/depression, HLD, HTN hx CVA  are also affecting patient's functional outcome.   REHAB POTENTIAL: Good  CLINICAL DECISION MAKING: Evolving/moderate complexity  EVALUATION COMPLEXITY: Moderate   GOALS: Goals reviewed with patient? Yes  SHORT TERM GOALS: Target date: 08/29/2023    Patient will be independent with HEP in  order to improve functional outcomes. Baseline: Goal status: INITIAL  2.  Patient will report at least 25% improvement in symptoms for improved quality of life. Baseline: Goal status: INITIAL    LONG TERM GOALS: Target date: 09/26/2023    Patient will report at least 75% improvement in symptoms for improved quality of life. Baseline:  Goal status: INITIAL  2.  Patient will improve LEFS score by at least 9 points in order to indicate improved tolerance to activity. Baseline: 53/80 Goal status: INITIAL  3.  Patient will demonstrate grade of 5/5 MMT grade in all tested musculature as evidence of improved strength to assist with stair ambulation and gait.   Baseline:  Goal status: INITIAL  4.  Patient will be able to complete 5x STS in under 10 seconds with improved motor control for improved functional strength. Baseline:  Goal status: INITIAL  5.  Patient will report improved ability to ambulate without pain.  Baseline:  Goal status: INITIAL    PLAN:  PT FREQUENCY: 2x/week  PT DURATION: 8 weeks  PLANNED INTERVENTIONS: 97164- PT Re-evaluation, 97110-Therapeutic exercises, 97530- Therapeutic activity, 97112- Neuromuscular re-education, 97535- Self Care, [id number]- Manual therapy, [phone]- Gait training, [phone]- Orthotic Fit/training, [phone]- Canalith repositioning, [id number]- Aquatic Therapy, [phone]- Splinting,  Patient/Family education, Balance training, Stair training, Taping, Dry Needling, Joint mobilization, Joint manipulation, Spinal manipulation, Spinal mobilization, Scar mobilization, and DME instructions.  PLAN FOR NEXT SESSION: f/u with HEP, glute and quad strength, core strength, single leg stability   Prentice RAMAN Syna Gad, PT 08/01/2023, 12:05 PM

## 2023-08-02 ENCOUNTER — Ambulatory Visit: Payer: Medicare Other

## 2023-08-10 ENCOUNTER — Encounter (HOSPITAL_BASED_OUTPATIENT_CLINIC_OR_DEPARTMENT_OTHER): Payer: Self-pay

## 2023-08-10 ENCOUNTER — Ambulatory Visit: Payer: Medicare Other

## 2023-08-10 ENCOUNTER — Ambulatory Visit (HOSPITAL_BASED_OUTPATIENT_CLINIC_OR_DEPARTMENT_OTHER): Payer: Medicare Other

## 2023-08-10 DIAGNOSIS — R2689 Other abnormalities of gait and mobility: Secondary | ICD-10-CM

## 2023-08-10 DIAGNOSIS — R29898 Other symptoms and signs involving the musculoskeletal system: Secondary | ICD-10-CM

## 2023-08-10 DIAGNOSIS — M25552 Pain in left hip: Secondary | ICD-10-CM

## 2023-08-10 DIAGNOSIS — M7052 Other bursitis of knee, left knee: Secondary | ICD-10-CM | POA: Diagnosis not present

## 2023-08-10 DIAGNOSIS — M6281 Muscle weakness (generalized): Secondary | ICD-10-CM

## 2023-08-10 NOTE — Therapy (Signed)
OUTPATIENT PHYSICAL THERAPY LOWER EXTREMITY EVALUATION   Patient Name: Marie Walker MRN: [id number] DOB:Mar 05, 1951, 73 y.o., female Today's Date: 08/10/2023  END OF SESSION:  PT End of Session - 08/10/23 1310     Visit Number 2    Number of Visits 16    Date for PT Re-Evaluation 09/26/23    Authorization Type Medicare    Progress Note Due on Visit 10    PT Start Time 1102    PT Stop Time 1145    PT Time Calculation (min) 43 min    Activity Tolerance Patient tolerated treatment well    Behavior During Therapy WFL for tasks assessed/performed              Past Medical History:  Diagnosis Date   ADD (attention deficit disorder)    Allergy    Anxiety    Asthma    Depression    Dermatitis    GERD (gastroesophageal reflux disease)    Heart murmur    Hyperlipidemia    Hypertension    Stroke [facility])    Past Surgical History:  Procedure Laterality Date   BREAST BIOPSY  1980   REPLACEMENT TOTAL KNEE BILATERAL     right-2019, left-2009   right foot fracture      TONSILLECTOMY  1954   Patient Active Problem List   Diagnosis Date Noted   Asymptomatic microscopic hematuria 12/15/2022   Acute gout involving toe of left foot 06/08/2021   Asthma without status asthmaticus 06/08/2021   Attention deficit disorder (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 06/08/2021   Chronic gouty arthritis 06/08/2021   Essential hypertension 06/08/2021   Fibromyalgia 06/08/2021   Gastroesophageal reflux disease without esophagitis 06/08/2021   Generalized anxiety disorder 06/08/2021   Gout attack 06/08/2021   Hearing loss 06/08/2021   Muscular fasciculation 06/08/2021   Obesity 06/08/2021   Otitis externa 06/08/2021   Primary localized osteoarthritis of pelvic region and thigh 06/08/2021   Pure hypercholesterolemia 06/08/2021   Seasonal allergic rhinitis 06/08/2021   TIA (transient ischemic attack) 01/29/2021    PCP: No PCP  REFERRING PROVIDER: Samson Frederic, MD  REFERRING DIAG: M70.52 (ICD-10-CM) -  Other bursitis of knee, left knee - Pes anserinus bursitis of L knee; Patient stating left hip bursitis is actually the issue.   THERAPY DIAG:  Pain in left hip  Muscle weakness (generalized)  Other abnormalities of gait and mobility  Other symptoms and signs involving the musculoskeletal system  Rationale for Evaluation and Treatment: Rehabilitation  ONSET DATE: chronic   SUBJECTIVE:   SUBJECTIVE STATEMENT:  Pt reports her hip isn't feeling too bad today. 5/10 pain level.   Eval: Patient states left hip bursitis causing referred pain down leg. Stroke affected LLE. Has had a shot which has helped in the past but did not help recently. Pain increases with walking. Pain after sitting playing the piano for too long. Hip is affecting the back too. Has to wear back brace with playing the piano.   PERTINENT HISTORY: Hx Anxiety/depression, HLD, HTN, hx CVA affecting LLE PAIN:  Are you having pain? No  PRECAUTIONS: None  WEIGHT BEARING RESTRICTIONS: No  FALLS:  Has patient fallen in last 6 months? Yes. Number of falls 1  PLOF: Independent  PATIENT GOALS: want to be able to walk without pain, play piano without pain  OBJECTIVE: (objective measures from initial evaluation unless otherwise dated)  PATIENT SURVEYS: LEFS 53/80  COGNITION: Overall cognitive status: Within functional limits for tasks assessed     SENSATION:  WFL   PALPATION:TTP L glutes, greater troch, lower L lumbar paraspinals  LOWER EXTREMITY ROM: WFL for tasks assessed  Active ROM Right eval Left eval  Hip flexion    Hip extension    Hip abduction    Hip adduction    Hip internal rotation    Hip external rotation    Knee flexion    Knee extension    Ankle dorsiflexion    Ankle plantarflexion    Ankle inversion    Ankle eversion     (Blank rows = not tested) *= pain/symptoms  LOWER EXTREMITY MMT:  MMT Right eval Left eval  Hip flexion 5 4  Hip extension 4 4-  Hip abduction 4- 4-  Hip  adduction    Hip internal rotation    Hip external rotation    Knee flexion 5 5  Knee extension 4 4+  Ankle dorsiflexion    Ankle plantarflexion    Ankle inversion    Ankle eversion     (Blank rows = not tested) *= pain/symptoms  FUNCTIONAL TESTS:  5 times sit to stand: 10.54 seconds without UE support, impaired eccentric control, mild dynamic knee valgus Stairs: 7 inch alternating, no UE support, decreased concentric and eccentric strength  GAIT: Distance walked: 100 feet Assistive device utilized: None Level of assistance: Complete Independence Comments: WFL   TODAY'S TREATMENT:                                                                                                                              DATE:   Treatment                            08/10/23: Blank lines following charge title = not provided on this treatment date.   Manual:  TPDN No PROM L hip STM to L gluteal mm and QL There-ex: HSS Piriformis stretch LTR 5" x10ea Supine SLR 2x10L Bridge 2x10 S/l clam 2x10bil Sit to stands 2x10 LAQ 3# 5" 3x10L There-Act:  Self Care:  Nuro-Re-ed:  Gait Training:    08/01/23 Eval and HEP    PATIENT EDUCATION:  Education details: Patient educated on exam findings, POC, scope of PT, HEP, anatomy, and exercise mechanics. Person educated: Patient Education method: Explanation, Demonstration, and Handouts Education comprehension: verbalized understanding, returned demonstration, verbal cues required, and tactile cues required  HOME EXERCISE PROGRAM: Access Code: [email or web address] URL: [email or web address]  ASSESSMENT:  CLINICAL IMPRESSION:  Tender throughout L hip and low back upon palpation. Utilized gentle STM to decrease soft tissue restrictions in glutes and QL on L side. Good tolerance to exercises today with cues for pt to remain within pain limitations. With sit to stands she did note mild knee discomfort. Reviewed gym machine she can use when  she works out at [employer].   Eval: Patient a 73 y.o. y.o. female who was seen today for physical therapy evaluation and treatment for L hip  bursitis. Patient presents with pain limited deficits in L hip strength, ROM, endurance, activity tolerance, and functional mobility with ADL. Patient is having to modify and restrict ADL as indicated by outcome measure score as well as subjective information and objective measures which is affecting overall participation. Patient will benefit from skilled physical therapy in order to improve function and reduce impairment.  OBJECTIVE IMPAIRMENTS: Abnormal gait, decreased activity tolerance, decreased balance, decreased mobility, difficulty walking, decreased strength, impaired flexibility, improper body mechanics, and pain.   ACTIVITY LIMITATIONS: carrying, lifting, bending, standing, squatting, stairs, transfers, hygiene/grooming, locomotion level, and caring for others  PARTICIPATION LIMITATIONS: meal prep, cleaning, laundry, shopping, community activity, and yard work  PERSONAL FACTORS: 3+ comorbidities: Hx Anxiety/depression, HLD, HTN hx CVA  are also affecting patient's functional outcome.   REHAB POTENTIAL: Good  CLINICAL DECISION MAKING: Evolving/moderate complexity  EVALUATION COMPLEXITY: Moderate   GOALS: Goals reviewed with patient? Yes  SHORT TERM GOALS: Target date: 08/29/2023    Patient will be independent with HEP in order to improve functional outcomes. Baseline: Goal status: INITIAL  2.  Patient will report at least 25% improvement in symptoms for improved quality of life. Baseline: Goal status: INITIAL    LONG TERM GOALS: Target date: 09/26/2023    Patient will report at least 75% improvement in symptoms for improved quality of life. Baseline:  Goal status: INITIAL  2.  Patient will improve LEFS score by at least 9 points in order to indicate improved tolerance to activity. Baseline: 53/80 Goal status: INITIAL  3.   Patient will demonstrate grade of 5/5 MMT grade in all tested musculature as evidence of improved strength to assist with stair ambulation and gait.   Baseline:  Goal status: INITIAL  4.  Patient will be able to complete 5x STS in under 10 seconds with improved motor control for improved functional strength. Baseline:  Goal status: INITIAL  5.  Patient will report improved ability to ambulate without pain.  Baseline:  Goal status: INITIAL    PLAN:  PT FREQUENCY: 2x/week  PT DURATION: 8 weeks  PLANNED INTERVENTIONS: 97164- PT Re-evaluation, 97110-Therapeutic exercises, 97530- Therapeutic activity, 97112- Neuromuscular re-education, 97535- Self Care, 16109- Manual therapy, [phone]- Gait training, [phone]- Orthotic Fit/training, [phone]- Canalith repositioning, [id number]- Aquatic Therapy, [phone]- Splinting, Patient/Family education, Balance training, Stair training, Taping, Dry Needling, Joint mobilization, Joint manipulation, Spinal manipulation, Spinal mobilization, Scar mobilization, and DME instructions.  PLAN FOR NEXT SESSION: f/u with HEP, glute and quad strength, core strength, single leg stability   Donnel Saxon Macky Galik, PTA 08/10/2023, 1:11 PM

## 2023-08-14 ENCOUNTER — Encounter (HOSPITAL_BASED_OUTPATIENT_CLINIC_OR_DEPARTMENT_OTHER): Payer: Self-pay | Admitting: Physical Therapy

## 2023-08-14 ENCOUNTER — Ambulatory Visit (HOSPITAL_BASED_OUTPATIENT_CLINIC_OR_DEPARTMENT_OTHER): Payer: Medicare Other | Admitting: Physical Therapy

## 2023-08-14 DIAGNOSIS — M25552 Pain in left hip: Secondary | ICD-10-CM

## 2023-08-14 DIAGNOSIS — R2689 Other abnormalities of gait and mobility: Secondary | ICD-10-CM

## 2023-08-14 DIAGNOSIS — M7052 Other bursitis of knee, left knee: Secondary | ICD-10-CM | POA: Diagnosis not present

## 2023-08-14 DIAGNOSIS — M6281 Muscle weakness (generalized): Secondary | ICD-10-CM

## 2023-08-14 NOTE — Therapy (Signed)
 OUTPATIENT PHYSICAL THERAPY LOWER EXTREMITY TREATMENT   Patient Name: Marie Walker MRN: [id number] DOB:[birth year], 73 y.o., female Today's Date: 08/14/2023  END OF SESSION:  PT End of Session - 08/14/23 0930     Visit Number 3    Number of Visits 16    Date for PT Re-Evaluation 09/26/23    Authorization Type Medicare    Progress Note Due on Visit 10    PT Start Time 0930    PT Stop Time 1008    PT Time Calculation (min) 38 min              Past Medical History:  Diagnosis Date   ADD (attention deficit disorder)    Allergy    Anxiety    Asthma    Depression    Dermatitis    GERD (gastroesophageal reflux disease)    Heart murmur    Hyperlipidemia    Hypertension    Stroke [facility])    Past Surgical History:  Procedure Laterality Date   BREAST BIOPSY  1980   REPLACEMENT TOTAL KNEE BILATERAL     right-2019, left-2009   right foot fracture      TONSILLECTOMY  1954   Patient Active Problem List   Diagnosis Date Noted   Asymptomatic microscopic hematuria 12/15/2022   Acute gout involving toe of left foot 06/08/2021   Asthma without status asthmaticus 06/08/2021   Attention deficit disorder (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 06/08/2021   Chronic gouty arthritis 06/08/2021   Essential hypertension 06/08/2021   Fibromyalgia 06/08/2021   Gastroesophageal reflux disease without esophagitis 06/08/2021   Generalized anxiety disorder 06/08/2021   Gout attack 06/08/2021   Hearing loss 06/08/2021   Muscular fasciculation 06/08/2021   Obesity 06/08/2021   Otitis externa 06/08/2021   Primary localized osteoarthritis of pelvic region and thigh 06/08/2021   Pure hypercholesterolemia 06/08/2021   Seasonal allergic rhinitis 06/08/2021   TIA (transient ischemic attack) 01/29/2021    PCP: No PCP  REFERRING PROVIDER: Samson Frederic, MD  REFERRING DIAG: M70.52 (ICD-10-CM) - Other bursitis of knee, left knee - Pes anserinus bursitis of L knee; Patient stating left hip bursitis is actually the  issue.   THERAPY DIAG:  Pain in left hip  Muscle weakness (generalized)  Other abnormalities of gait and mobility  Rationale for Evaluation and Treatment: Rehabilitation  ONSET DATE: chronic   SUBJECTIVE:   SUBJECTIVE STATEMENT:  Pt reports her hip isn't too painful today.  Doesn't remember how she did after last session. She comes to [facility] 3x / wk - 4 laps on track, completes hip machines and NuStep (~40 min total)  Eval: Patient states left hip bursitis causing referred pain down leg. Stroke affected LLE. Has had a shot which has helped in the past but did not help recently. Pain increases with walking. Pain after sitting playing the piano for too long. Hip is affecting the back too. Has to wear back brace with playing the piano.   PERTINENT HISTORY: Hx Anxiety/depression, HLD, HTN, hx CVA affecting LLE PAIN:  Are you having pain? Yes NPRS: 2/10 Location: L hip    PRECAUTIONS: None  WEIGHT BEARING RESTRICTIONS: No  FALLS:  Has patient fallen in last 6 months? Yes. Number of falls 1  PLOF: Independent  PATIENT GOALS: want to be able to walk without pain, play piano without pain  OBJECTIVE: (objective measures from initial evaluation unless otherwise dated)  PATIENT SURVEYS: LEFS 53/80  COGNITION: Overall cognitive status: Within functional limits for tasks assessed  SENSATION: WFL   PALPATION:TTP L glutes, greater troch, lower L lumbar paraspinals  LOWER EXTREMITY ROM: WFL for tasks assessed  Active ROM Right eval Left eval  Hip flexion    Hip extension    Hip abduction    Hip adduction    Hip internal rotation    Hip external rotation    Knee flexion    Knee extension    Ankle dorsiflexion    Ankle plantarflexion    Ankle inversion    Ankle eversion     (Blank rows = not tested) *= pain/symptoms  LOWER EXTREMITY MMT:  MMT Right eval Left eval  Hip flexion 5 4  Hip extension 4 4-  Hip abduction 4- 4-  Hip adduction    Hip  internal rotation    Hip external rotation    Knee flexion 5 5  Knee extension 4 4+  Ankle dorsiflexion    Ankle plantarflexion    Ankle inversion    Ankle eversion     (Blank rows = not tested) *= pain/symptoms  FUNCTIONAL TESTS:  5 times sit to stand: 10.54 seconds without UE support, impaired eccentric control, mild dynamic knee valgus Stairs: 7 inch alternating, no UE support, decreased concentric and eccentric strength  GAIT: Distance walked: 100 feet Assistive device utilized: None Level of assistance: Complete Independence Comments: WFL   TODAY'S TREATMENT:                                                                                                                              [facility] Adult PT Treatment:                                                DATE: 08/14/23  Therapeutic Exercise: NuStep L4, UE/LE,  x 5 min for warm up - therapist present to monitor and discuss progress Lt LAQ with 3#, 5 sec hold, 2 x 10 L hip abdct 2 x 10 L clams 2 x 10 Bridge with 5 sec hold 2 x 10 Hooklying piriformis stretch 20s x 2 each LE Supine ITB stretch with strap (not tolerated-back pain) Seated hamstring/ITB stretch (not tolerated-back pain) Standing ITB stretch (not tolerated-back pain) Therapeutic Activity: STS with forward arm reach,x 10 - cues to slow speed    Treatment                            08/10/23:  Manual:  TPDN No PROM L hip STM to L gluteal mm and QL There-ex: HSS Piriformis stretch LTR 5" x10ea Supine SLR 2x10L Bridge 2x10 S/l clam 2x10bil Sit to stands 2x10 LAQ 3# 5" 3x10L   08/01/23 Eval and HEP    PATIENT EDUCATION:  Education details: HEP, anatomy, and exercise mechanics. Person educated: Patient Education method: Explanation, Demonstration,  and Handouts Education comprehension: verbalized understanding, returned demonstration, verbal cues required, and tactile cues required  HOME EXERCISE PROGRAM: Access Code: [email or web address] URL:  [email or web address]  ASSESSMENT:  CLINICAL IMPRESSION: Pt tolerated L hip strengthening exercises well, but did not tolerate attempts at L ITB stretches as this produced pain in her central lower back. She required frequent cues to slow the speed of her exercises.  Updated and printed HEP.  Goals are ongoing. Patient will benefit from skilled physical therapy in order to improve function and reduce impairment.    From Initial Eval: Patient a 73 y.o. y.o. female who was seen today for physical therapy evaluation and treatment for L hip bursitis. Patient presents with pain limited deficits in L hip strength, ROM, endurance, activity tolerance, and functional mobility with ADL. Patient is having to modify and restrict ADL as indicated by outcome measure score as well as subjective information and objective measures which is affecting overall participation. Patient will benefit from skilled physical therapy in order to improve function and reduce impairment.  OBJECTIVE IMPAIRMENTS: Abnormal gait, decreased activity tolerance, decreased balance, decreased mobility, difficulty walking, decreased strength, impaired flexibility, improper body mechanics, and pain.   ACTIVITY LIMITATIONS: carrying, lifting, bending, standing, squatting, stairs, transfers, hygiene/grooming, locomotion level, and caring for others  PARTICIPATION LIMITATIONS: meal prep, cleaning, laundry, shopping, community activity, and yard work  PERSONAL FACTORS: 3+ comorbidities: Hx Anxiety/depression, HLD, HTN hx CVA  are also affecting patient's functional outcome.   REHAB POTENTIAL: Good  CLINICAL DECISION MAKING: Evolving/moderate complexity  EVALUATION COMPLEXITY: Moderate   GOALS: Goals reviewed with patient? Yes  SHORT TERM GOALS: Target date: 08/29/2023    Patient will be independent with HEP in order to improve functional outcomes. Baseline: Goal status: INITIAL  2.  Patient will report at least  25% improvement in symptoms for improved quality of life. Baseline: Goal status: INITIAL    LONG TERM GOALS: Target date: 09/26/2023    Patient will report at least 75% improvement in symptoms for improved quality of life. Baseline:  Goal status: INITIAL  2.  Patient will improve LEFS score by at least 9 points in order to indicate improved tolerance to activity. Baseline: 53/80 Goal status: INITIAL  3.  Patient will demonstrate grade of 5/5 MMT grade in all tested musculature as evidence of improved strength to assist with stair ambulation and gait.   Baseline:  Goal status: INITIAL  4.  Patient will be able to complete 5x STS in under 10 seconds with improved motor control for improved functional strength. Baseline:  Goal status: INITIAL  5.  Patient will report improved ability to ambulate without pain.  Baseline:  Goal status: INITIAL    PLAN:  PT FREQUENCY: 2x/week  PT DURATION: 8 weeks  PLANNED INTERVENTIONS: 97164- PT Re-evaluation, 97110-Therapeutic exercises, 97530- Therapeutic activity, 97112- Neuromuscular re-education, 97535- Self Care, 16109- Manual therapy, [phone]- Gait training, [phone]- Orthotic Fit/training, [phone]- Canalith repositioning, [id number]- Aquatic Therapy, [phone]- Splinting, Patient/Family education, Balance training, Stair training, Taping, Dry Needling, Joint mobilization, Joint manipulation, Spinal manipulation, Spinal mobilization, Scar mobilization, and DME instructions.  PLAN FOR NEXT SESSION: f/u with HEP, glute and quad strength, core strength, single leg stability   Mayer Camel, PTA 08/14/23 12:12 PM [facility] [address] [place], [place], [address] Phone: [phone]   Fax:  [phone]

## 2023-08-17 ENCOUNTER — Ambulatory Visit: Payer: Medicare Other

## 2023-08-21 ENCOUNTER — Encounter (HOSPITAL_BASED_OUTPATIENT_CLINIC_OR_DEPARTMENT_OTHER): Payer: Self-pay

## 2023-08-21 ENCOUNTER — Ambulatory Visit (HOSPITAL_BASED_OUTPATIENT_CLINIC_OR_DEPARTMENT_OTHER): Payer: Medicare Other

## 2023-08-21 DIAGNOSIS — R29898 Other symptoms and signs involving the musculoskeletal system: Secondary | ICD-10-CM

## 2023-08-21 DIAGNOSIS — M25552 Pain in left hip: Secondary | ICD-10-CM

## 2023-08-21 DIAGNOSIS — R2689 Other abnormalities of gait and mobility: Secondary | ICD-10-CM

## 2023-08-21 DIAGNOSIS — M6281 Muscle weakness (generalized): Secondary | ICD-10-CM

## 2023-08-21 DIAGNOSIS — M7052 Other bursitis of knee, left knee: Secondary | ICD-10-CM | POA: Diagnosis not present

## 2023-08-21 NOTE — Therapy (Signed)
 OUTPATIENT PHYSICAL THERAPY LOWER EXTREMITY TREATMENT   Patient Name: Marie Walker MRN: [id number] DOB:[birth year], 73 y.o., female Today's Date: 08/21/2023  END OF SESSION:  PT End of Session - 08/21/23 1112     Visit Number 4    Number of Visits 16    Date for PT Re-Evaluation 09/26/23    Authorization Type Medicare    Progress Note Due on Visit 10    PT Start Time 1106    PT Stop Time 1146    PT Time Calculation (min) 40 min    Activity Tolerance Patient tolerated treatment well    Behavior During Therapy WFL for tasks assessed/performed               Past Medical History:  Diagnosis Date   ADD (attention deficit disorder)    Allergy    Anxiety    Asthma    Depression    Dermatitis    GERD (gastroesophageal reflux disease)    Heart murmur    Hyperlipidemia    Hypertension    Stroke [facility])    Past Surgical History:  Procedure Laterality Date   BREAST BIOPSY  1980   REPLACEMENT TOTAL KNEE BILATERAL     right-2019, left-2009   right foot fracture      TONSILLECTOMY  1954   Patient Active Problem List   Diagnosis Date Noted   Asymptomatic microscopic hematuria 12/15/2022   Acute gout involving toe of left foot 06/08/2021   Asthma without status asthmaticus 06/08/2021   Attention deficit disorder (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 06/08/2021   Chronic gouty arthritis 06/08/2021   Essential hypertension 06/08/2021   Fibromyalgia 06/08/2021   Gastroesophageal reflux disease without esophagitis 06/08/2021   Generalized anxiety disorder 06/08/2021   Gout attack 06/08/2021   Hearing loss 06/08/2021   Muscular fasciculation 06/08/2021   Obesity 06/08/2021   Otitis externa 06/08/2021   Primary localized osteoarthritis of pelvic region and thigh 06/08/2021   Pure hypercholesterolemia 06/08/2021   Seasonal allergic rhinitis 06/08/2021   TIA (transient ischemic attack) 01/29/2021    PCP: No PCP  REFERRING PROVIDER: Samson Frederic, MD  REFERRING DIAG: M70.52 (ICD-10-CM) -  Other bursitis of knee, left knee - Pes anserinus bursitis of L knee; Patient stating left hip bursitis is actually the issue.   THERAPY DIAG:  Pain in left hip  Muscle weakness (generalized)  Other abnormalities of gait and mobility  Other symptoms and signs involving the musculoskeletal system  Rationale for Evaluation and Treatment: Rehabilitation  ONSET DATE: chronic   SUBJECTIVE:   SUBJECTIVE STATEMENT:  Pt reports her hip isn't too painful today.  Doesn't remember how she did after last session. She comes to [facility] 3x / wk - 4 laps on track, completes hip machines and NuStep (~40 min total)  Eval: Patient states left hip bursitis causing referred pain down leg. Stroke affected LLE. Has had a shot which has helped in the past but did not help recently. Pain increases with walking. Pain after sitting playing the piano for too long. Hip is affecting the back too. Has to wear back brace with playing the piano.   PERTINENT HISTORY: Hx Anxiety/depression, HLD, HTN, hx CVA affecting LLE PAIN:  Are you having pain? Yes NPRS: 2/10 Location: L hip    PRECAUTIONS: None  WEIGHT BEARING RESTRICTIONS: No  FALLS:  Has patient fallen in last 6 months? Yes. Number of falls 1  PLOF: Independent  PATIENT GOALS: want to be able to walk without pain, play piano without  pain  OBJECTIVE: (objective measures from initial evaluation unless otherwise dated)  PATIENT SURVEYS: LEFS 53/80  COGNITION: Overall cognitive status: Within functional limits for tasks assessed     SENSATION: WFL   PALPATION:TTP L glutes, greater troch, lower L lumbar paraspinals  LOWER EXTREMITY ROM: WFL for tasks assessed  Active ROM Right eval Left eval  Hip flexion    Hip extension    Hip abduction    Hip adduction    Hip internal rotation    Hip external rotation    Knee flexion    Knee extension    Ankle dorsiflexion    Ankle plantarflexion    Ankle inversion    Ankle eversion      (Blank rows = not tested) *= pain/symptoms  LOWER EXTREMITY MMT:  MMT Right eval Left eval  Hip flexion 5 4  Hip extension 4 4-  Hip abduction 4- 4-  Hip adduction    Hip internal rotation    Hip external rotation    Knee flexion 5 5  Knee extension 4 4+  Ankle dorsiflexion    Ankle plantarflexion    Ankle inversion    Ankle eversion     (Blank rows = not tested) *= pain/symptoms  FUNCTIONAL TESTS:  5 times sit to stand: 10.54 seconds without UE support, impaired eccentric control, mild dynamic knee valgus Stairs: 7 inch alternating, no UE support, decreased concentric and eccentric strength  GAIT: Distance walked: 100 feet Assistive device utilized: None Level of assistance: Complete Independence Comments: WFL   TODAY'S TREATMENT:                                                                                                                              [facility] Adult PT   Treatment:                                                DATE: 08/21/23  Therapeutic Exercise: NuStep L4, UE/LE,  x 5 min  Lt LAQ with 4#, 5 sec hold, 1x10, 1x8 L hip abdct 2 x 10 SL clams 2x15 bil Bridge 3x5 (held 5"with first set, but had increased pain so stopped) Hooklying piriformis stretch 30s x 2 each LE Standing HR/TR 2# cuff weights x20 Therapeutic Activity: Staggered STS with L placed posterior x10 Standing march 2# 2x10    Treatment:                                                DATE: 08/14/23  Therapeutic Exercise: NuStep L4, UE/LE,  x 5 min for warm up - therapist present to monitor and discuss progress Lt LAQ with 3#, 5 sec hold, 2 x 10 L  hip abdct 2 x 10 L clams 2 x 10 Bridge with 5 sec hold 2 x 10 Hooklying piriformis stretch 20s x 2 each LE Supine ITB stretch with strap (not tolerated-back pain) Seated hamstring/ITB stretch (not tolerated-back pain) Standing ITB stretch (not tolerated-back pain) Therapeutic Activity: STS with forward arm reach,x 10 - cues to slow speed     Treatment                            08/10/23:  Manual:  TPDN No PROM L hip STM to L gluteal mm and QL There-ex: HSS Piriformis stretch LTR 5" x10ea Supine SLR 2x10L Bridge 2x10 S/l clam 2x10bil Sit to stands 2x10 LAQ 3# 5" 3x10L   08/01/23 Eval and HEP    PATIENT EDUCATION:  Education details: HEP, anatomy, and exercise mechanics. Person educated: Patient Education method: Explanation, Demonstration, and Handouts Education comprehension: verbalized understanding, returned demonstration, verbal cues required, and tactile cues required  HOME EXERCISE PROGRAM: Access Code: [email or web address] URL: [email or web address]  ASSESSMENT:  CLINICAL IMPRESSION: Pt experienced some cramping in hip flexors/proximal quads with LAQ initially, though this improved quickly. Able to increase weight with LAQ and initiate resisted open chain exercises in standing. Some fatigue in L quads present with sit to stands, especially in medial aspect. Some tenderness here as well. Pt noted increased fatigue in L LE with standing hip abduction and s/l clams compared to R side. Provided update pictures for HEP at pt request.    From Initial Eval: Patient a 73 y.o. y.o. female who was seen today for physical therapy evaluation and treatment for L hip bursitis. Patient presents with pain limited deficits in L hip strength, ROM, endurance, activity tolerance, and functional mobility with ADL. Patient is having to modify and restrict ADL as indicated by outcome measure score as well as subjective information and objective measures which is affecting overall participation. Patient will benefit from skilled physical therapy in order to improve function and reduce impairment.  OBJECTIVE IMPAIRMENTS: Abnormal gait, decreased activity tolerance, decreased balance, decreased mobility, difficulty walking, decreased strength, impaired flexibility, improper body mechanics, and pain.   ACTIVITY LIMITATIONS:  carrying, lifting, bending, standing, squatting, stairs, transfers, hygiene/grooming, locomotion level, and caring for others  PARTICIPATION LIMITATIONS: meal prep, cleaning, laundry, shopping, community activity, and yard work  PERSONAL FACTORS: 3+ comorbidities: Hx Anxiety/depression, HLD, HTN hx CVA  are also affecting patient's functional outcome.   REHAB POTENTIAL: Good  CLINICAL DECISION MAKING: Evolving/moderate complexity  EVALUATION COMPLEXITY: Moderate   GOALS: Goals reviewed with patient? Yes  SHORT TERM GOALS: Target date: 08/29/2023    Patient will be independent with HEP in order to improve functional outcomes. Baseline: Goal status: MET 2/24  2.  Patient will report at least 25% improvement in symptoms for improved quality of life. Baseline: Goal status: INITIAL    LONG TERM GOALS: Target date: 09/26/2023    Patient will report at least 75% improvement in symptoms for improved quality of life. Baseline:  Goal status: INITIAL  2.  Patient will improve LEFS score by at least 9 points in order to indicate improved tolerance to activity. Baseline: 53/80 Goal status: INITIAL  3.  Patient will demonstrate grade of 5/5 MMT grade in all tested musculature as evidence of improved strength to assist with stair ambulation and gait.   Baseline:  Goal status: INITIAL  4.  Patient will be able to complete 5x STS in under 10 seconds with improved motor control  for improved functional strength. Baseline:  Goal status: INITIAL  5.  Patient will report improved ability to ambulate without pain.  Baseline:  Goal status: INITIAL    PLAN:  PT FREQUENCY: 2x/week  PT DURATION: 8 weeks  PLANNED INTERVENTIONS: 97164- PT Re-evaluation, 97110-Therapeutic exercises, 97530- Therapeutic activity, 97112- Neuromuscular re-education, 97535- Self Care, 16109- Manual therapy, [phone]- Gait training, [phone]- Orthotic Fit/training, [phone]- Canalith repositioning, [id number]- Aquatic Therapy,  [phone]- Splinting, Patient/Family education, Balance training, Stair training, Taping, Dry Needling, Joint mobilization, Joint manipulation, Spinal manipulation, Spinal mobilization, Scar mobilization, and DME instructions.  PLAN FOR NEXT SESSION: f/u with HEP, glute and quad strength, core strength, single leg stability   Riki Altes, PTA  08/21/23 12:04 PM [facility] [address] [place], [place], [address] Phone: [phone]   Fax:  [phone]

## 2023-08-24 ENCOUNTER — Encounter (HOSPITAL_BASED_OUTPATIENT_CLINIC_OR_DEPARTMENT_OTHER): Payer: Medicare Other | Admitting: Physical Therapy

## 2023-08-25 NOTE — Therapy (Signed)
 OUTPATIENT PHYSICAL THERAPY LOWER EXTREMITY TREATMENT   Patient Name: Marie Walker MRN: [id number] DOB:04/11/1951, 73 y.o., female Today's Date: 08/28/2023  END OF SESSION:  PT End of Session - 08/28/23 1227     Visit Number 5    Number of Visits 16    Date for PT Re-Evaluation 09/26/23    Authorization Type Medicare    Progress Note Due on Visit 10    PT Start Time 1145    PT Stop Time 1225    PT Time Calculation (min) 40 min    Activity Tolerance Patient tolerated treatment well    Behavior During Therapy WFL for tasks assessed/performed                Past Medical History:  Diagnosis Date   ADD (attention deficit disorder)    Allergy    Anxiety    Asthma    Depression    Dermatitis    GERD (gastroesophageal reflux disease)    Heart murmur    Hyperlipidemia    Hypertension    Stroke [facility])    Past Surgical History:  Procedure Laterality Date   BREAST BIOPSY  1980   REPLACEMENT TOTAL KNEE BILATERAL     right-2019, left-2009   right foot fracture      TONSILLECTOMY  1954   Patient Active Problem List   Diagnosis Date Noted   Asymptomatic microscopic hematuria 12/15/2022   Acute gout involving toe of left foot 06/08/2021   Asthma without status asthmaticus 06/08/2021   Attention deficit disorder (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 06/08/2021   Chronic gouty arthritis 06/08/2021   Essential hypertension 06/08/2021   Fibromyalgia 06/08/2021   Gastroesophageal reflux disease without esophagitis 06/08/2021   Generalized anxiety disorder 06/08/2021   Gout attack 06/08/2021   Hearing loss 06/08/2021   Muscular fasciculation 06/08/2021   Obesity 06/08/2021   Otitis externa 06/08/2021   Primary localized osteoarthritis of pelvic region and thigh 06/08/2021   Pure hypercholesterolemia 06/08/2021   Seasonal allergic rhinitis 06/08/2021   TIA (transient ischemic attack) 01/29/2021    PCP: No PCP  REFERRING PROVIDER: Samson Frederic, MD  REFERRING DIAG: M70.52 (ICD-10-CM) -  Other bursitis of knee, left knee - Pes anserinus bursitis of L knee; Patient stating left hip bursitis is actually the issue.   THERAPY DIAG:  Pain in left hip  Muscle weakness (generalized)  Other abnormalities of gait and mobility  Other symptoms and signs involving the musculoskeletal system  Rationale for Evaluation and Treatment: Rehabilitation  ONSET DATE: chronic   SUBJECTIVE:   SUBJECTIVE STATEMENT:  Pt reports no pain today.  Eval: Patient states left hip bursitis causing referred pain down leg. Stroke affected LLE. Has had a shot which has helped in the past but did not help recently. Pain increases with walking. Pain after sitting playing the piano for too long. Hip is affecting the back too. Has to wear back brace with playing the piano.   PERTINENT HISTORY: Hx Anxiety/depression, HLD, HTN, hx CVA affecting LLE PAIN:  Are you having pain? Yes NPRS: 2/10 Location: L hip    PRECAUTIONS: None  WEIGHT BEARING RESTRICTIONS: No  FALLS:  Has patient fallen in last 6 months? Yes. Number of falls 1  PLOF: Independent  PATIENT GOALS: want to be able to walk without pain, play piano without pain  OBJECTIVE: (objective measures from initial evaluation unless otherwise dated)  PATIENT SURVEYS: LEFS 53/80  COGNITION: Overall cognitive status: Within functional limits for tasks assessed  SENSATION: WFL   PALPATION:TTP L glutes, greater troch, lower L lumbar paraspinals  LOWER EXTREMITY ROM: WFL for tasks assessed  Active ROM Right eval Left eval  Hip flexion    Hip extension    Hip abduction    Hip adduction    Hip internal rotation    Hip external rotation    Knee flexion    Knee extension    Ankle dorsiflexion    Ankle plantarflexion    Ankle inversion    Ankle eversion     (Blank rows = not tested) *= pain/symptoms  LOWER EXTREMITY MMT:  MMT Right eval Left eval  Hip flexion 5 4  Hip extension 4 4-  Hip abduction 4- 4-  Hip  adduction    Hip internal rotation    Hip external rotation    Knee flexion 5 5  Knee extension 4 4+  Ankle dorsiflexion    Ankle plantarflexion    Ankle inversion    Ankle eversion     (Blank rows = not tested) *= pain/symptoms  FUNCTIONAL TESTS:  5 times sit to stand: 10.54 seconds without UE support, impaired eccentric control, mild dynamic knee valgus Stairs: 7 inch alternating, no UE support, decreased concentric and eccentric strength  GAIT: Distance walked: 100 feet Assistive device utilized: None Level of assistance: Complete Independence Comments: WFL   TODAY'S TREATMENT:                                                                                                                              [facility] Adult PT  Treatment:                                                DATE: 08/28/23  Therapeutic Exercise: NuStep L4, UE/LE,  x 5 min  Lt LAQ with 4#, 5 sec hold, 1x10, 1x8 L hip abdct 2 x 10 SL clams 2x15 bil Bridge 3x5 (held 5"with first set, but had increased pain so stopped) Hooklying piriformis stretch 30s x 2 each LE Side stepping along table with GTB around knees.  Therapeutic Activity: Staggered STS with L placed posterior x10 Standing march 2# 2x10   Treatment:                                                DATE: 08/21/23  Therapeutic Exercise: NuStep L4, UE/LE,  x 5 min  Lt LAQ with 4#, 5 sec hold, 1x10, 1x8 L hip abdct 2 x 10 SL clams 2x15 bil Bridge 3x5 (held 5"with first set, but had increased pain so stopped) Hooklying piriformis stretch 30s x 2 each LE Standing HR/TR 2# cuff weights x20 Therapeutic Activity: Staggered STS with L  placed posterior x10 Standing march 2# 2x10    Treatment:                                                DATE: 08/14/23  Therapeutic Exercise: NuStep L4, UE/LE,  x 5 min for warm up - therapist present to monitor and discuss progress Lt LAQ with 3#, 5 sec hold, 2 x 10 L hip abdct 2 x 10 L clams 2 x 10 Bridge with 5  sec hold 2 x 10 Hooklying piriformis stretch 20s x 2 each LE Supine ITB stretch with strap (not tolerated-back pain) Seated hamstring/ITB stretch (not tolerated-back pain) Standing ITB stretch (not tolerated-back pain) Therapeutic Activity: STS with forward arm reach,x 10 - cues to slow speed    Treatment                            08/10/23:  Manual:  TPDN No PROM L hip STM to L gluteal mm and QL There-ex: HSS Piriformis stretch LTR 5" x10ea Supine SLR 2x10L Bridge 2x10 S/l clam 2x10bil Sit to stands 2x10 LAQ 3# 5" 3x10L   08/01/23 Eval and HEP    PATIENT EDUCATION:  Education details: HEP, anatomy, and exercise mechanics. Person educated: Patient Education method: Explanation, Demonstration, and Handouts Education comprehension: verbalized understanding, returned demonstration, verbal cues required, and tactile cues required  HOME EXERCISE PROGRAM: Access Code: [email or web address] URL: [email or web address]  ASSESSMENT:  CLINICAL IMPRESSION: Pt tolerated all prescribed exercises well today. No adverse effects or pain today. She is making good progress and shows no fatigue today. Pt will continue to benefit from skilled PT to address continued deficits.      From Initial Eval: Patient a 73 y.o. y.o. female who was seen today for physical therapy evaluation and treatment for L hip bursitis. Patient presents with pain limited deficits in L hip strength, ROM, endurance, activity tolerance, and functional mobility with ADL. Patient is having to modify and restrict ADL as indicated by outcome measure score as well as subjective information and objective measures which is affecting overall participation. Patient will benefit from skilled physical therapy in order to improve function and reduce impairment.  OBJECTIVE IMPAIRMENTS: Abnormal gait, decreased activity tolerance, decreased balance, decreased mobility, difficulty walking, decreased strength, impaired  flexibility, improper body mechanics, and pain.   ACTIVITY LIMITATIONS: carrying, lifting, bending, standing, squatting, stairs, transfers, hygiene/grooming, locomotion level, and caring for others  PARTICIPATION LIMITATIONS: meal prep, cleaning, laundry, shopping, community activity, and yard work  PERSONAL FACTORS: 3+ comorbidities: Hx Anxiety/depression, HLD, HTN hx CVA  are also affecting patient's functional outcome.   REHAB POTENTIAL: Good  CLINICAL DECISION MAKING: Evolving/moderate complexity  EVALUATION COMPLEXITY: Moderate   GOALS: Goals reviewed with patient? Yes  SHORT TERM GOALS: Target date: 08/29/2023    Patient will be independent with HEP in order to improve functional outcomes. Baseline: Goal status: MET 2/24  2.  Patient will report at least 25% improvement in symptoms for improved quality of life. Baseline: Goal status: INITIAL    LONG TERM GOALS: Target date: 09/26/2023    Patient will report at least 75% improvement in symptoms for improved quality of life. Baseline:  Goal status: INITIAL  2.  Patient will improve LEFS score by at least 9 points in order to indicate improved tolerance to activity. Baseline: 53/80 Goal  status: INITIAL  3.  Patient will demonstrate grade of 5/5 MMT grade in all tested musculature as evidence of improved strength to assist with stair ambulation and gait.   Baseline:  Goal status: INITIAL  4.  Patient will be able to complete 5x STS in under 10 seconds with improved motor control for improved functional strength. Baseline:  Goal status: INITIAL  5.  Patient will report improved ability to ambulate without pain.  Baseline:  Goal status: INITIAL    PLAN:  PT FREQUENCY: 2x/week  PT DURATION: 8 weeks  PLANNED INTERVENTIONS: 97164- PT Re-evaluation, 97110-Therapeutic exercises, 97530- Therapeutic activity, 97112- Neuromuscular re-education, 97535- Self Care, 16109- Manual therapy, [phone]- Gait training, [phone]-  Orthotic Fit/training, [phone]- Canalith repositioning, [id number]- Aquatic Therapy, [phone]- Splinting, Patient/Family education, Balance training, Stair training, Taping, Dry Needling, Joint mobilization, Joint manipulation, Spinal manipulation, Spinal mobilization, Scar mobilization, and DME instructions.  PLAN FOR NEXT SESSION: f/u with HEP, glute and quad strength, core strength, single leg stability   Royal Hawthorn PT, DPT 08/28/23  12:28 PM   [facility] [address] [place], [place], [address] Phone: [phone]   Fax:  [phone]

## 2023-08-28 ENCOUNTER — Ambulatory Visit (HOSPITAL_BASED_OUTPATIENT_CLINIC_OR_DEPARTMENT_OTHER): Payer: Medicare Other | Attending: Orthopedic Surgery | Admitting: Physical Therapy

## 2023-08-28 DIAGNOSIS — R29898 Other symptoms and signs involving the musculoskeletal system: Secondary | ICD-10-CM | POA: Insufficient documentation

## 2023-08-28 DIAGNOSIS — M6281 Muscle weakness (generalized): Secondary | ICD-10-CM | POA: Diagnosis present

## 2023-08-28 DIAGNOSIS — R2689 Other abnormalities of gait and mobility: Secondary | ICD-10-CM | POA: Insufficient documentation

## 2023-08-28 DIAGNOSIS — M25552 Pain in left hip: Secondary | ICD-10-CM | POA: Insufficient documentation

## 2023-08-31 ENCOUNTER — Encounter (HOSPITAL_BASED_OUTPATIENT_CLINIC_OR_DEPARTMENT_OTHER): Payer: Self-pay | Admitting: Physical Therapy

## 2023-08-31 ENCOUNTER — Ambulatory Visit: Payer: Medicare Other

## 2023-08-31 ENCOUNTER — Telehealth (HOSPITAL_BASED_OUTPATIENT_CLINIC_OR_DEPARTMENT_OTHER): Payer: Self-pay

## 2023-08-31 ENCOUNTER — Ambulatory Visit (HOSPITAL_BASED_OUTPATIENT_CLINIC_OR_DEPARTMENT_OTHER): Payer: Medicare Other | Admitting: Physical Therapy

## 2023-08-31 DIAGNOSIS — M6281 Muscle weakness (generalized): Secondary | ICD-10-CM

## 2023-08-31 DIAGNOSIS — R2689 Other abnormalities of gait and mobility: Secondary | ICD-10-CM

## 2023-08-31 DIAGNOSIS — M25552 Pain in left hip: Secondary | ICD-10-CM | POA: Diagnosis not present

## 2023-08-31 NOTE — Therapy (Signed)
 OUTPATIENT PHYSICAL THERAPY LOWER EXTREMITY TREATMENT   Patient Name: Marie Walker MRN: [id number] DOB:September 27, 1950, 73 y.o., female Today's Date: 08/31/2023  END OF SESSION:  PT End of Session - 08/31/23 1350     Visit Number 6    Number of Visits 16    Date for PT Re-Evaluation 09/26/23    Authorization Type Medicare    Progress Note Due on Visit 10    PT Start Time 1350    PT Stop Time 1430    PT Time Calculation (min) 40 min    Activity Tolerance Patient tolerated treatment well    Behavior During Therapy WFL for tasks assessed/performed                Past Medical History:  Diagnosis Date   ADD (attention deficit disorder)    Allergy    Anxiety    Asthma    Depression    Dermatitis    GERD (gastroesophageal reflux disease)    Heart murmur    Hyperlipidemia    Hypertension    Stroke [facility]    Past Surgical History:  Procedure Laterality Date   BREAST BIOPSY  1980   REPLACEMENT TOTAL KNEE BILATERAL     right-2019, left-2009   right foot fracture      TONSILLECTOMY  1954   Patient Active Problem List   Diagnosis Date Noted   Asymptomatic microscopic hematuria 12/15/2022   Acute gout involving toe of left foot 06/08/2021   Asthma without status asthmaticus 06/08/2021   Attention deficit disorder (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 06/08/2021   Chronic gouty arthritis 06/08/2021   Essential hypertension 06/08/2021   Fibromyalgia 06/08/2021   Gastroesophageal reflux disease without esophagitis 06/08/2021   Generalized anxiety disorder 06/08/2021   Gout attack 06/08/2021   Hearing loss 06/08/2021   Muscular fasciculation 06/08/2021   Obesity 06/08/2021   Otitis externa 06/08/2021   Primary localized osteoarthritis of pelvic region and thigh 06/08/2021   Pure hypercholesterolemia 06/08/2021   Seasonal allergic rhinitis 06/08/2021   TIA (transient ischemic attack) 01/29/2021    PCP: No PCP  REFERRING PROVIDER: Samson Frederic, MD  REFERRING DIAG: M70.52 (ICD-10-CM) -  Other bursitis of knee, left knee - Pes anserinus bursitis of L knee; Patient stating left hip bursitis is actually the issue.   THERAPY DIAG:  Pain in left hip  Muscle weakness (generalized)  Other abnormalities of gait and mobility  Rationale for Evaluation and Treatment: Rehabilitation  ONSET DATE: chronic   SUBJECTIVE:   SUBJECTIVE STATEMENT:  Pt reports LLE still weak.   Eval: Patient states left hip bursitis causing referred pain down leg. Stroke affected LLE. Has had a shot which has helped in the past but did not help recently. Pain increases with walking. Pain after sitting playing the piano for too long. Hip is affecting the back too. Has to wear back brace with playing the piano.   PERTINENT HISTORY: Hx Anxiety/depression, HLD, HTN, hx CVA affecting LLE PAIN:  Are you having pain? Yes NPRS: 2/10 Location: L hip    PRECAUTIONS: None  WEIGHT BEARING RESTRICTIONS: No  FALLS:  Has patient fallen in last 6 months? Yes. Number of falls 1  PLOF: Independent  PATIENT GOALS: want to be able to walk without pain, play piano without pain  OBJECTIVE: (objective measures from initial evaluation unless otherwise dated)  PATIENT SURVEYS: LEFS 53/80  COGNITION: Overall cognitive status: Within functional limits for tasks assessed     SENSATION: WFL   PALPATION:TTP L glutes, greater  troch, lower L lumbar paraspinals  LOWER EXTREMITY ROM: WFL for tasks assessed  Active ROM Right eval Left eval  Hip flexion    Hip extension    Hip abduction    Hip adduction    Hip internal rotation    Hip external rotation    Knee flexion    Knee extension    Ankle dorsiflexion    Ankle plantarflexion    Ankle inversion    Ankle eversion     (Blank rows = not tested) *= pain/symptoms  LOWER EXTREMITY MMT:  MMT Right eval Left eval  Hip flexion 5 4  Hip extension 4 4-  Hip abduction 4- 4-  Hip adduction    Hip internal rotation    Hip external rotation    Knee  flexion 5 5  Knee extension 4 4+  Ankle dorsiflexion    Ankle plantarflexion    Ankle inversion    Ankle eversion     (Blank rows = not tested) *= pain/symptoms  FUNCTIONAL TESTS:  5 times sit to stand: 10.54 seconds without UE support, impaired eccentric control, mild dynamic knee valgus Stairs: 7 inch alternating, no UE support, decreased concentric and eccentric strength  GAIT: Distance walked: 100 feet Assistive device utilized: None Level of assistance: Complete Independence Comments: WFL   TODAY'S TREATMENT:                                                                                                                              [facility] Adult PT  08/31/23 NuStep L6, UE/LE,  x 5 min  Prone hip extension 3 x 10 Single leg stance with vectors 5 x 5 second holds Lateral stepping GTB at knees 6 x 10 feet bilateral Lateral step up 6  inch 2 x 10   Treatment:                                                DATE: 08/28/23  Therapeutic Exercise: NuStep L4, UE/LE,  x 5 min  Lt LAQ with 4#, 5 sec hold, 1x10, 1x8 L hip abdct 2 x 10 SL clams 2x15 bil Bridge 3x5 (held 5"with first set, but had increased pain so stopped) Hooklying piriformis stretch 30s x 2 each LE Side stepping along table with GTB around knees.  Therapeutic Activity: Staggered STS with L placed posterior x10 Standing march 2# 2x10   Treatment:                                                DATE: 08/21/23  Therapeutic Exercise: NuStep L4, UE/LE,  x 5 min  Lt LAQ with 4#, 5 sec hold, 1x10, 1x8 L hip abdct 2  x 10 SL clams 2x15 bil Bridge 3x5 (held 5"with first set, but had increased pain so stopped) Hooklying piriformis stretch 30s x 2 each LE Standing HR/TR 2# cuff weights x20 Therapeutic Activity: Staggered STS with L placed posterior x10 Standing march 2# 2x10    Treatment:                                                DATE: 08/14/23  Therapeutic Exercise: NuStep L4, UE/LE,  x 5 min for warm up -  therapist present to monitor and discuss progress Lt LAQ with 3#, 5 sec hold, 2 x 10 L hip abdct 2 x 10 L clams 2 x 10 Bridge with 5 sec hold 2 x 10 Hooklying piriformis stretch 20s x 2 each LE Supine ITB stretch with strap (not tolerated-back pain) Seated hamstring/ITB stretch (not tolerated-back pain) Standing ITB stretch (not tolerated-back pain) Therapeutic Activity: STS with forward arm reach,x 10 - cues to slow speed    Treatment                            08/10/23:  Manual:  TPDN No PROM L hip STM to L gluteal mm and QL There-ex: HSS Piriformis stretch LTR 5" x10ea Supine SLR 2x10L Bridge 2x10 S/l clam 2x10bil Sit to stands 2x10 LAQ 3# 5" 3x10L   08/01/23 Eval and HEP    PATIENT EDUCATION:  Education details: HEP, anatomy, and exercise mechanics. Person educated: Patient Education method: Explanation, Demonstration, and Handouts Education comprehension: verbalized understanding, returned demonstration, verbal cues required, and tactile cues required  HOME EXERCISE PROGRAM: Access Code: [email or web address] URL: [email or web address]  ASSESSMENT:  CLINICAL IMPRESSION: Emphasis on glute and functional strength today. She tolerates session well and overall appears to be progressing well. Patient will continue to benefit from physical therapy in order to improve function and reduce impairment.      From Initial Eval: Patient a 73 y.o. y.o. female who was seen today for physical therapy evaluation and treatment for L hip bursitis. Patient presents with pain limited deficits in L hip strength, ROM, endurance, activity tolerance, and functional mobility with ADL. Patient is having to modify and restrict ADL as indicated by outcome measure score as well as subjective information and objective measures which is affecting overall participation. Patient will benefit from skilled physical therapy in order to improve function and reduce impairment.  OBJECTIVE  IMPAIRMENTS: Abnormal gait, decreased activity tolerance, decreased balance, decreased mobility, difficulty walking, decreased strength, impaired flexibility, improper body mechanics, and pain.   ACTIVITY LIMITATIONS: carrying, lifting, bending, standing, squatting, stairs, transfers, hygiene/grooming, locomotion level, and caring for others  PARTICIPATION LIMITATIONS: meal prep, cleaning, laundry, shopping, community activity, and yard work  PERSONAL FACTORS: 3+ comorbidities: Hx Anxiety/depression, HLD, HTN hx CVA  are also affecting patient's functional outcome.   REHAB POTENTIAL: Good  CLINICAL DECISION MAKING: Evolving/moderate complexity  EVALUATION COMPLEXITY: Moderate   GOALS: Goals reviewed with patient? Yes  SHORT TERM GOALS: Target date: 08/29/2023    Patient will be independent with HEP in order to improve functional outcomes. Baseline: Goal status: MET 2/24  2.  Patient will report at least 25% improvement in symptoms for improved quality of life. Baseline: Goal status: INITIAL    LONG TERM GOALS: Target date: 09/26/2023    Patient will report at least 75%  improvement in symptoms for improved quality of life. Baseline:  Goal status: INITIAL  2.  Patient will improve LEFS score by at least 9 points in order to indicate improved tolerance to activity. Baseline: 53/80 Goal status: INITIAL  3.  Patient will demonstrate grade of 5/5 MMT grade in all tested musculature as evidence of improved strength to assist with stair ambulation and gait.   Baseline:  Goal status: INITIAL  4.  Patient will be able to complete 5x STS in under 10 seconds with improved motor control for improved functional strength. Baseline:  Goal status: INITIAL  5.  Patient will report improved ability to ambulate without pain.  Baseline:  Goal status: INITIAL    PLAN:  PT FREQUENCY: 2x/week  PT DURATION: 8 weeks  PLANNED INTERVENTIONS: 97164- PT Re-evaluation, 97110-Therapeutic  exercises, 97530- Therapeutic activity, 97112- Neuromuscular re-education, 97535- Self Care, 16109- Manual therapy, [phone]- Gait training, [phone]- Orthotic Fit/training, [phone]- Canalith repositioning, [id number]- Aquatic Therapy, [phone]- Splinting, Patient/Family education, Balance training, Stair training, Taping, Dry Needling, Joint mobilization, Joint manipulation, Spinal manipulation, Spinal mobilization, Scar mobilization, and DME instructions.  PLAN FOR NEXT SESSION: f/u with HEP, glute and quad strength, core strength, single leg stability   1:51 PM, 08/31/23 Wyman Songster PT, DPT Physical Therapist at [facility] [address] [place], [place], [address] Phone: [phone]   Fax:  [phone]

## 2023-08-31 NOTE — Therapy (Signed)
 OUTPATIENT PHYSICAL THERAPY LOWER EXTREMITY TREATMENT   Patient Name: Marie Walker MRN: [id number] DOB:June 29, 1950, 73 y.o., female Today's Date: 08/31/2023  END OF SESSION:       Past Medical History:  Diagnosis Date   ADD (attention deficit disorder)    Allergy    Anxiety    Asthma    Depression    Dermatitis    GERD (gastroesophageal reflux disease)    Heart murmur    Hyperlipidemia    Hypertension    Stroke [facility])    Past Surgical History:  Procedure Laterality Date   BREAST BIOPSY  1980   REPLACEMENT TOTAL KNEE BILATERAL     right-2019, left-2009   right foot fracture      TONSILLECTOMY  1954   Patient Active Problem List   Diagnosis Date Noted   Asymptomatic microscopic hematuria 12/15/2022   Acute gout involving toe of left foot 06/08/2021   Asthma without status asthmaticus 06/08/2021   Attention deficit disorder (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 06/08/2021   Chronic gouty arthritis 06/08/2021   Essential hypertension 06/08/2021   Fibromyalgia 06/08/2021   Gastroesophageal reflux disease without esophagitis 06/08/2021   Generalized anxiety disorder 06/08/2021   Gout attack 06/08/2021   Hearing loss 06/08/2021   Muscular fasciculation 06/08/2021   Obesity 06/08/2021   Otitis externa 06/08/2021   Primary localized osteoarthritis of pelvic region and thigh 06/08/2021   Pure hypercholesterolemia 06/08/2021   Seasonal allergic rhinitis 06/08/2021   TIA (transient ischemic attack) 01/29/2021    PCP: No PCP  REFERRING PROVIDER: Samson Frederic, MD  REFERRING DIAG: M70.52 (ICD-10-CM) - Other bursitis of knee, left knee - Pes anserinus bursitis of L knee; Patient stating left hip bursitis is actually the issue.   THERAPY DIAG:  No diagnosis found.  Rationale for Evaluation and Treatment: Rehabilitation  ONSET DATE: chronic   SUBJECTIVE:   SUBJECTIVE STATEMENT:  Pt reports no pain today.  Eval: Patient states left hip bursitis causing referred pain down leg.  Stroke affected LLE. Has had a shot which has helped in the past but did not help recently. Pain increases with walking. Pain after sitting playing the piano for too long. Hip is affecting the back too. Has to wear back brace with playing the piano.   PERTINENT HISTORY: Hx Anxiety/depression, HLD, HTN, hx CVA affecting LLE PAIN:  Are you having pain? Yes NPRS: 2/10 Location: L hip    PRECAUTIONS: None  WEIGHT BEARING RESTRICTIONS: No  FALLS:  Has patient fallen in last 6 months? Yes. Number of falls 1  PLOF: Independent  PATIENT GOALS: want to be able to walk without pain, play piano without pain  OBJECTIVE: (objective measures from initial evaluation unless otherwise dated)  PATIENT SURVEYS: LEFS 53/80  COGNITION: Overall cognitive status: Within functional limits for tasks assessed     SENSATION: WFL   PALPATION:TTP L glutes, greater troch, lower L lumbar paraspinals  LOWER EXTREMITY ROM: WFL for tasks assessed  Active ROM Right eval Left eval  Hip flexion    Hip extension    Hip abduction    Hip adduction    Hip internal rotation    Hip external rotation    Knee flexion    Knee extension    Ankle dorsiflexion    Ankle plantarflexion    Ankle inversion    Ankle eversion     (Blank rows = not tested) *= pain/symptoms  LOWER EXTREMITY MMT:  MMT Right eval Left eval  Hip flexion 5 4  Hip extension  4 4-  Hip abduction 4- 4-  Hip adduction    Hip internal rotation    Hip external rotation    Knee flexion 5 5  Knee extension 4 4+  Ankle dorsiflexion    Ankle plantarflexion    Ankle inversion    Ankle eversion     (Blank rows = not tested) *= pain/symptoms  FUNCTIONAL TESTS:  5 times sit to stand: 10.54 seconds without UE support, impaired eccentric control, mild dynamic knee valgus Stairs: 7 inch alternating, no UE support, decreased concentric and eccentric strength  GAIT: Distance walked: 100 feet Assistive device utilized: None Level of  assistance: Complete Independence Comments: WFL   TODAY'S TREATMENT:                                                                                                                              [facility] Adult PT  Treatment:                                                DATE: 08/28/23  Therapeutic Exercise: NuStep L4, UE/LE,  x 5 min  Lt LAQ with 4#, 5 sec hold, 1x10, 1x8 L hip abdct 2 x 10 SL clams 2x15 bil Bridge 3x5 (held 5"with first set, but had increased pain so stopped) Hooklying piriformis stretch 30s x 2 each LE Side stepping along table with GTB around knees.  Therapeutic Activity: Staggered STS with L placed posterior x10 Standing march 2# 2x10   Treatment:                                                DATE: 08/21/23  Therapeutic Exercise: NuStep L4, UE/LE,  x 5 min  Lt LAQ with 4#, 5 sec hold, 1x10, 1x8 L hip abdct 2 x 10 SL clams 2x15 bil Bridge 3x5 (held 5"with first set, but had increased pain so stopped) Hooklying piriformis stretch 30s x 2 each LE Standing HR/TR 2# cuff weights x20 Therapeutic Activity: Staggered STS with L placed posterior x10 Standing march 2# 2x10    Treatment:                                                DATE: 08/14/23  Therapeutic Exercise: NuStep L4, UE/LE,  x 5 min for warm up - therapist present to monitor and discuss progress Lt LAQ with 3#, 5 sec hold, 2 x 10 L hip abdct 2 x 10 L clams 2 x 10 Bridge with 5 sec hold 2 x 10 Hooklying piriformis stretch 20s x 2 each  LE Supine ITB stretch with strap (not tolerated-back pain) Seated hamstring/ITB stretch (not tolerated-back pain) Standing ITB stretch (not tolerated-back pain) Therapeutic Activity: STS with forward arm reach,x 10 - cues to slow speed    Treatment                            08/10/23:  Manual:  TPDN No PROM L hip STM to L gluteal mm and QL There-ex: HSS Piriformis stretch LTR 5" x10ea Supine SLR 2x10L Bridge 2x10 S/l clam 2x10bil Sit to stands  2x10 LAQ 3# 5" 3x10L   08/01/23 Eval and HEP    PATIENT EDUCATION:  Education details: HEP, anatomy, and exercise mechanics. Person educated: Patient Education method: Explanation, Demonstration, and Handouts Education comprehension: verbalized understanding, returned demonstration, verbal cues required, and tactile cues required  HOME EXERCISE PROGRAM: Access Code: [email or web address] URL: [email or web address]  ASSESSMENT:  CLINICAL IMPRESSION: Pt tolerated all prescribed exercises well today. No adverse effects or pain today. She is making good progress and shows no fatigue today. Pt will continue to benefit from skilled PT to address continued deficits.      From Initial Eval: Patient a 72 y.o. y.o. female who was seen today for physical therapy evaluation and treatment for L hip bursitis. Patient presents with pain limited deficits in L hip strength, ROM, endurance, activity tolerance, and functional mobility with ADL. Patient is having to modify and restrict ADL as indicated by outcome measure score as well as subjective information and objective measures which is affecting overall participation. Patient will benefit from skilled physical therapy in order to improve function and reduce impairment.  OBJECTIVE IMPAIRMENTS: Abnormal gait, decreased activity tolerance, decreased balance, decreased mobility, difficulty walking, decreased strength, impaired flexibility, improper body mechanics, and pain.   ACTIVITY LIMITATIONS: carrying, lifting, bending, standing, squatting, stairs, transfers, hygiene/grooming, locomotion level, and caring for others  PARTICIPATION LIMITATIONS: meal prep, cleaning, laundry, shopping, community activity, and yard work  PERSONAL FACTORS: 3+ comorbidities: Hx Anxiety/depression, HLD, HTN hx CVA  are also affecting patient's functional outcome.   REHAB POTENTIAL: Good  CLINICAL DECISION MAKING: Evolving/moderate complexity  EVALUATION  COMPLEXITY: Moderate   GOALS: Goals reviewed with patient? Yes  SHORT TERM GOALS: Target date: 08/29/2023    Patient will be independent with HEP in order to improve functional outcomes. Baseline: Goal status: MET 2/24  2.  Patient will report at least 25% improvement in symptoms for improved quality of life. Baseline: Goal status: INITIAL    LONG TERM GOALS: Target date: 09/26/2023    Patient will report at least 75% improvement in symptoms for improved quality of life. Baseline:  Goal status: INITIAL  2.  Patient will improve LEFS score by at least 9 points in order to indicate improved tolerance to activity. Baseline: 53/80 Goal status: INITIAL  3.  Patient will demonstrate grade of 5/5 MMT grade in all tested musculature as evidence of improved strength to assist with stair ambulation and gait.   Baseline:  Goal status: INITIAL  4.  Patient will be able to complete 5x STS in under 10 seconds with improved motor control for improved functional strength. Baseline:  Goal status: INITIAL  5.  Patient will report improved ability to ambulate without pain.  Baseline:  Goal status: INITIAL    PLAN:  PT FREQUENCY: 2x/week  PT DURATION: 8 weeks  PLANNED INTERVENTIONS: 97164- PT Re-evaluation, 97110-Therapeutic exercises, 97530- Therapeutic activity, [id number]- Neuromuscular re-education, 97535- Self Care, 16109- Manual therapy, [id number]- Gait  training, 16109- Orthotic Fit/training, 60454- Canalith repositioning, 09811- Aquatic Therapy, [phone]- Splinting, Patient/Family education, Balance training, Stair training, Taping, Dry Needling, Joint mobilization, Joint manipulation, Spinal manipulation, Spinal mobilization, Scar mobilization, and DME instructions.  PLAN FOR NEXT SESSION: f/u with HEP, glute and quad strength, core strength, single leg stability    Lenore Manner, PTA 08/31/2023, 7:54 AM    [facility] [address] [place], [place], [address] Phone: [phone]   Fax:  [phone]

## 2023-08-31 NOTE — Telephone Encounter (Signed)
 Called pt and left VM regarding missed appt this morning. Reminded of next scheduled visit.

## 2023-09-01 ENCOUNTER — Ambulatory Visit
Admission: RE | Admit: 2023-09-01 | Discharge: 2023-09-01 | Disposition: A | Discharge status: Home / Self Care | Source: Ambulatory Visit | Attending: Family Medicine | Admitting: Family Medicine

## 2023-09-01 DIAGNOSIS — Z1231 Encounter for screening mammogram for malignant neoplasm of breast: Secondary | ICD-10-CM

## 2023-09-02 ENCOUNTER — Ambulatory Visit
Admission: RE | Admit: 2023-09-02 | Discharge: 2023-09-02 | Disposition: A | Discharge status: Home / Self Care | Payer: Self-pay | Source: Ambulatory Visit | Attending: Internal Medicine | Admitting: Internal Medicine

## 2023-09-02 VITALS — BP 131/81 | HR 88 | Temp 97.8°F | Resp 18

## 2023-09-02 DIAGNOSIS — R350 Frequency of micturition: Secondary | ICD-10-CM | POA: Diagnosis not present

## 2023-09-02 DIAGNOSIS — R300 Dysuria: Secondary | ICD-10-CM | POA: Diagnosis not present

## 2023-09-02 LAB — POCT URINALYSIS DIP (MANUAL ENTRY)
Bilirubin, UA: NEGATIVE
Glucose, UA: NEGATIVE mg/dL
Ketones, POC UA: NEGATIVE mg/dL
Leukocytes, UA: NEGATIVE
Nitrite, UA: NEGATIVE
Protein Ur, POC: NEGATIVE mg/dL
Spec Grav, UA: 1.02 (ref 1.010–1.025)
Urobilinogen, UA: 0.2 U/dL
pH, UA: 6 (ref 5.0–8.0)

## 2023-09-02 NOTE — ED Provider Notes (Signed)
 [facility]    CSN: [id number] Arrival date & time: 09/02/23  0851      History   Chief Complaint Chief Complaint  Patient presents with   UTI    Entered by patient    HPI Marie Walker is a 73 y.o. female.   Patient presents for concern of urinary tract infection.  Patient reports that her urine has felt "picky" and she has had a little bit of urinary frequency over the past 3 days.  Denies any new vaginal discharge.  Patient reports that she is occasionally sexually active but denies exposure to STD.  Denies abdominal pain, flank pain, fever.  Denies history of frequent urinary tract infections.     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   Pain in right elbow 04/19/2023   Asymptomatic microscopic hematuria 12/15/2022   Acute gout involving toe of left foot 06/08/2021   Asthma without status asthmaticus 06/08/2021   Attention deficit disorder (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 06/08/2021   Chronic gouty arthritis 06/08/2021   Essential hypertension 06/08/2021   Fibromyalgia 06/08/2021   Gastroesophageal reflux disease without esophagitis 06/08/2021   Generalized anxiety disorder 06/08/2021   Gout attack 06/08/2021   Hearing loss 06/08/2021   Muscular fasciculation 06/08/2021   Obesity 06/08/2021   Otitis externa 06/08/2021   Primary localized osteoarthritis of pelvic region and thigh 06/08/2021   Pure hypercholesterolemia 06/08/2021   Seasonal allergic rhinitis 06/08/2021   Lumbar spondylosis 02/23/2021   TIA (transient ischemic attack) 01/29/2021   Low back pain 01/19/2021   Acquired hallux rigidus 07/10/2019   Closed fracture of base of fifth metatarsal bone  07/10/2019   Pain in left knee 07/10/2019   Dyslipidemia 04/24/2014   Family history of premature CAD 04/24/2014    Past Surgical History:  Procedure Laterality Date   BREAST BIOPSY  1980   REPLACEMENT TOTAL KNEE BILATERAL     right-2019, left-2009   right foot fracture      TONSILLECTOMY  1954    OB History   No obstetric history on file.      Home Medications    Prior to Admission medications   Medication Sig Start Date End Date Taking? Authorizing Provider  cyclobenzaprine (FLEXERIL) 10 MG tablet Take 1 tablet 3 times a day by oral route. 08/10/22  Yes [provider]  escitalopram (LEXAPRO) 10 MG tablet Take 10 mg by mouth daily. 06/28/23  Yes [provider]  finasteride (PROSCAR) 5 MG tablet Take 2.5 mg by mouth daily. 08/10/23  Yes [provider]  ADDERALL XR 15 MG 24 hr capsule 1 capsule in the morning Orally Once a day for 90 days 02/28/23   [provider]  albuterol (VENTOLIN HFA) 108 (90 Base) MCG/ACT inhaler every 4 (four) hours as needed.  11/05/19   [provider]  amLODipine (NORVASC) 10 MG tablet amlodipine 10 mg tablet 09/03/19   [provider]  aspirin 81 MG EC tablet Take by mouth once. Tablet 09/01/19   [provider]  atorvastatin (LIPITOR) 40 MG tablet Take 60 mg by mouth daily.  [provider]  azithromycin (ZITHROMAX) 250 MG tablet Take 1 tablet (250 mg total) by mouth daily. Take first 2 tablets together, then 1 every day until finished. 07/02/23   Garrison, [place] N, FNP  B COMPLEX VITAMINS ER PO Take by mouth.    [provider]  benzonatate (TESSALON) 200 MG capsule Take 1 capsule (200 mg total) by mouth every 8 (eight) hours. 07/02/23   Garrison, [place] N, FNP  CALCIUM PO Take by mouth.    [provider]  cetirizine (ZYRTEC) 10 MG chewable tablet     [provider]  cetirizine (ZYRTEC) 10 MG tablet TAKE 1 TABLET BY MOUTH EVERY DAY. OTC NOT COVERED 12/30/22    Charlott Holler, MD  Ergocalciferol 10 MCG (400 UNIT) TABS Take 1,000 mcg by mouth daily. 01/29/21   [provider]  fluticasone (FLONASE) 50 MCG/ACT nasal spray Place 1 spray into both nostrils daily. 01/31/22   Charlott Holler, MD  hydrOXYzine (ATARAX) 25 MG tablet 1/2-1 tablet Orally every 8 hours as needed for anxiety or itching    [provider]  Magnesium 250 MG TABS 1 tablet with a meal Orally Once a day    [provider]  meloxicam (MOBIC) 7.5 MG tablet Take 7.5 mg by mouth daily as needed.    [provider]  methocarbamol (ROBAXIN) 500 MG tablet Take 500 mg by mouth 3 (three) times daily as needed. 03/08/23   [provider]  montelukast (SINGULAIR) 10 MG tablet TAKE 1 TABLET BY MOUTH EVERYDAY AT BEDTIME 03/31/23   Charlott Holler, MD  Multiple Vitamin (MULTIVITAMIN) tablet Take 1 tablet by mouth daily.    [provider]  pantoprazole (PROTONIX) 40 MG tablet Take 40 mg by mouth daily. 12/19/19   [provider]  traMADol (ULTRAM) 50 MG tablet Take 50 mg by mouth 3 (three) times daily as needed. 05/02/23   [provider]    Family History Family History  Problem Relation Age of Onset   Hypertension Mother    Hyperlipidemia Mother    Hearing loss Mother    Cancer Mother    Arthritis Mother    Heart attack Mother    Stroke Father    Heart attack Father    Hearing loss Father    Cancer Father    Alzheimer's disease Father    Depression Sister    Stroke Brother    Hypertension Brother    Arthritis Brother    Depression Brother    Heart attack Maternal Grandmother    Hearing loss Maternal Grandfather    Cancer Paternal Grandmother    Hearing loss Paternal Grandmother    Alcohol abuse Paternal Grandmother    Vision loss Paternal Grandmother    Early death Paternal Grandfather    Lung disease Neg Hx     Social History Social History   Tobacco Use   Smoking status: Never   Smokeless tobacco: Never   Vaping Use   Vaping status: Never Used  Substance Use Topics   Alcohol use: Yes    Alcohol/week: 2.0 standard drinks of alcohol    Types: 1 Glasses of wine, 1 Shots of liquor per week    Comment: ocassionally   Drug use: Not Currently     Allergies   Doxycycline, Molds & smuts, Morphine, Orphenadrine, Oxycodone-acetaminophen, Augmentin [amoxicillin-pot clavulanate], Orphenadrine citrate, Other, and Sulfamethoxazole-trimethoprim   Review of Systems Review of Systems Per HPI  Physical Exam Triage Vital Signs ED Triage Vitals  Encounter Vitals Group     BP 09/02/23 0907 131/81     Systolic BP Percentile --      Diastolic BP Percentile --      Pulse Rate 09/02/23 0907 88     Resp 09/02/23 0907 18     Temp 09/02/23 0907 97.8 F (36.6 C)     Temp Source 09/02/23 0907 Oral     SpO2 09/02/23 0907 97 %     Weight --      Height --      Head Circumference --      Peak Flow --      Pain Score 09/02/23 0906 0     Pain Loc --      Pain Education --      Exclude from Growth Chart --    No data found.  Updated Vital Signs BP 131/81 (BP Location: Left Arm)   Pulse 88   Temp 97.8 F (36.6 C) (Oral)   Resp 18   SpO2 97%   Visual Acuity Right Eye Distance:   Left Eye Distance:   Bilateral Distance:    Right Eye Near:   Left Eye Near:    Bilateral Near:     Physical Exam Constitutional:      General: She is not in acute distress.    Appearance: Normal appearance. She is not toxic-appearing or diaphoretic.  HENT:     Head: Normocephalic and atraumatic.  Eyes:     Extraocular Movements: Extraocular movements intact.     Conjunctiva/sclera: Conjunctivae normal.  Pulmonary:     Effort: Pulmonary effort is normal.  Genitourinary:    Comments: Deferred with shared decision making. Self swab performed.  Neurological:     General: No focal deficit present.     Mental Status: She is alert and oriented to person, place, and time. Mental status is at baseline.   Psychiatric:        Mood and Affect: Mood normal.        Behavior: Behavior normal.        Thought Content: Thought content normal.        Judgment: Judgment normal.      UC Treatments / Results  Labs (all labs ordered are listed, but only abnormal results are displayed) Labs Reviewed  POCT URINALYSIS DIP (MANUAL ENTRY) - Abnormal; Notable for the following components:      Result Value   Blood, UA trace-intact (*)    All other components within normal limits  URINE CULTURE  CERVICOVAGINAL ANCILLARY ONLY    EKG   Radiology No results found.  Procedures Procedures (including critical care time)  Medications Ordered in UC Medications - No data to display  Initial Impression / Assessment and Plan / UC Course  I have reviewed the triage vital signs and the nursing notes.  Pertinent labs & imaging results that were available during my care of the patient were reviewed by me and considered in my medical decision making (see chart for details).     UA unremarkable for infection.  Will send urine culture to confirm.  Cervicovaginal swab pending to rule out vaginitis as cause of patient's symptoms.  Patient had previous UA and culture that was unremarkable at previous urgent care visit so will await urine culture before treatment.  Patient declines STD testing.  Advised strict follow-up precautions if symptoms persist or worsen.  Encouraged clear water intake.  Also provided patient with urogynecology clinic evaluation.  Patient verbalized understanding and was  agreeable with plan. Final Clinical Impressions(s) / UC Diagnoses   Final diagnoses:  Dysuria  Urinary frequency     Discharge Instructions      Your vaginal swab and urine culture are pending.  We will call if they are abnormal.  Please follow-up if symptoms persist or worsen.  I have also attached phone number for urogynecology if necessary.    ED Prescriptions   None    PDMP not reviewed this  encounter.   Gustavus Bryant, [place] 09/02/23 1059

## 2023-09-02 NOTE — Discharge Instructions (Signed)
 Your vaginal swab and urine culture are pending.  We will call if they are abnormal.  Please follow-up if symptoms persist or worsen.  I have also attached phone number for urogynecology if necessary.

## 2023-09-02 NOTE — ED Triage Notes (Signed)
 Pt presents with UTI symptoms that onset around 3 days ago. Pt says she tested positive at home for UTI. Pt denies abdominal pain.

## 2023-09-03 LAB — URINE CULTURE: Culture: NO GROWTH

## 2023-09-04 ENCOUNTER — Encounter (HOSPITAL_BASED_OUTPATIENT_CLINIC_OR_DEPARTMENT_OTHER): Payer: Self-pay | Admitting: Physical Therapy

## 2023-09-04 ENCOUNTER — Ambulatory Visit (HOSPITAL_BASED_OUTPATIENT_CLINIC_OR_DEPARTMENT_OTHER): Payer: Medicare Other | Admitting: Physical Therapy

## 2023-09-04 DIAGNOSIS — M6281 Muscle weakness (generalized): Secondary | ICD-10-CM

## 2023-09-04 DIAGNOSIS — R2689 Other abnormalities of gait and mobility: Secondary | ICD-10-CM

## 2023-09-04 DIAGNOSIS — M25552 Pain in left hip: Secondary | ICD-10-CM | POA: Diagnosis not present

## 2023-09-04 LAB — CERVICOVAGINAL ANCILLARY ONLY
Bacterial Vaginitis (gardnerella): NEGATIVE
Candida Glabrata: NEGATIVE
Candida Vaginitis: NEGATIVE
Comment: NEGATIVE
Comment: NEGATIVE
Comment: NEGATIVE

## 2023-09-04 NOTE — Therapy (Signed)
 OUTPATIENT PHYSICAL THERAPY LOWER EXTREMITY TREATMENT   Patient Name: Marie Walker MRN: [id number] DOB:Aug 14, 1950, 73 y.o., female Today's Date: 09/04/2023  END OF SESSION:  PT End of Session - 09/04/23 1151     Visit Number 7    Number of Visits 16    Date for PT Re-Evaluation 09/26/23    Authorization Type Medicare    Progress Note Due on Visit 10    PT Start Time 1150    PT Stop Time 1228    PT Time Calculation (min) 38 min    Behavior During Therapy WFL for tasks assessed/performed                Past Medical History:  Diagnosis Date   ADD (attention deficit disorder)    Allergy    Anxiety    Asthma    Depression    Dermatitis    GERD (gastroesophageal reflux disease)    Heart murmur    Hyperlipidemia    Hypertension    Stroke [facility])    Past Surgical History:  Procedure Laterality Date   BREAST BIOPSY  1980   REPLACEMENT TOTAL KNEE BILATERAL     right-2019, left-2009   right foot fracture      TONSILLECTOMY  1954   Patient Active Problem List   Diagnosis Date Noted   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   Pain in right elbow 04/19/2023   Asymptomatic microscopic hematuria 12/15/2022   Acute gout involving toe of left foot 06/08/2021   Asthma without status asthmaticus 06/08/2021   Attention deficit disorder (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 06/08/2021   Chronic gouty arthritis 06/08/2021   Essential hypertension 06/08/2021   Fibromyalgia 06/08/2021   Gastroesophageal reflux disease without esophagitis 06/08/2021   Generalized anxiety disorder 06/08/2021   Gout attack 06/08/2021   Hearing loss 06/08/2021   Muscular fasciculation 06/08/2021   Obesity 06/08/2021   Otitis externa 06/08/2021   Primary localized osteoarthritis of pelvic region and thigh 06/08/2021   Pure hypercholesterolemia 06/08/2021   Seasonal allergic rhinitis 06/08/2021   Lumbar spondylosis 02/23/2021   TIA (transient ischemic attack) 01/29/2021   Low back pain 01/19/2021   Acquired hallux  rigidus 07/10/2019   Closed fracture of base of fifth metatarsal bone 07/10/2019   Pain in left knee 07/10/2019   Dyslipidemia 04/24/2014   Family history of premature CAD 04/24/2014    PCP: No PCP  REFERRING PROVIDER: Samson Frederic, MD  REFERRING DIAG: M70.52 (ICD-10-CM) - Other bursitis of knee, left knee - Pes anserinus bursitis of L knee; Patient stating left hip bursitis is actually the issue.   THERAPY DIAG:  Pain in left hip  Muscle weakness (generalized)  Other abnormalities of gait and mobility  Rationale for Evaluation and Treatment: Rehabilitation  ONSET DATE: chronic   SUBJECTIVE:   SUBJECTIVE STATEMENT:  Pt reports LLE is better, but still not as strong as RLE.   Unable to rate percentage of improvement. Continues to have pain with playing the piano.    Eval: Patient states left hip bursitis causing referred pain down leg. Stroke affected LLE. Has had a shot which has helped in the past but did not help recently. Pain increases with walking. Pain after sitting playing the piano for too long. Hip is affecting the back too. Has to wear back brace with playing the piano.   PERTINENT HISTORY: Hx Anxiety/depression, HLD, HTN, hx CVA affecting LLE PAIN:  Are you having pain? Yes NPRS: 2/10 Location: lower back into L hip  PRECAUTIONS: None  WEIGHT BEARING RESTRICTIONS: No  FALLS:  Has patient fallen in last 6 months? Yes. Number of falls 1  PLOF: Independent  PATIENT GOALS: want to be able to walk without pain, play piano without pain  OBJECTIVE: (objective measures from initial evaluation unless otherwise dated)  PATIENT SURVEYS: LEFS 53/80 ; 09/04/23:  57/80  COGNITION: Overall cognitive status: Within functional limits for tasks assessed     SENSATION: WFL   PALPATION:TTP L glutes, greater troch, lower L lumbar paraspinals  LOWER EXTREMITY ROM: WFL for tasks assessed  Active ROM Right eval Left eval  Hip flexion    Hip extension     Hip abduction    Hip adduction    Hip internal rotation    Hip external rotation    Knee flexion    Knee extension    Ankle dorsiflexion    Ankle plantarflexion    Ankle inversion    Ankle eversion     (Blank rows = not tested) *= pain/symptoms  LOWER EXTREMITY MMT:  MMT Right eval Left eval  Hip flexion 5 4  Hip extension 4 4-  Hip abduction 4- 4-  Hip adduction    Hip internal rotation    Hip external rotation    Knee flexion 5 5  Knee extension 4 4+  Ankle dorsiflexion    Ankle plantarflexion    Ankle inversion    Ankle eversion     (Blank rows = not tested) *= pain/symptoms  FUNCTIONAL TESTS:  5 times sit to stand: 10.54 seconds without UE support, impaired eccentric control, mild dynamic knee valgus Stairs: 7 inch alternating, no UE support, decreased concentric and eccentric strength  09/04/23: 5x STS: 9.53 without support  GAIT: Distance walked: 100 feet Assistive device utilized: None Level of assistance: Complete Independence Comments: WFL   TODAY'S TREATMENT:                                                                                                                              Treatment:                                                DATE: 09/04/23  5x STS  LEFS Therapeutic Exercise: NuStep L5, UE/LE,  x 5 min  Hip abdct 2# on ankle 2 x 10 each LE (some pain in L SI) - reduced LE height Hip ext 2# on ankle, 3 x 10 LLE, 2 x 10 RLE Staggered stance with bilat shoulder ext x 10 with LLE back ,x 5 with RLE back Lt LAQ with 4#, 5 sec hold, 2 x 10 Seated hamstring stretch x 15s each Seated piriformis stretch 15s x 2 each LE  Therapeutic Activity: Staggered STS with L placed posterior 2 x 5, 3 sec lowering Standing march 2# 2x10 Lateral step ups  x 10, with BUE on rail;  x 5 with slow controlled descent  [facility]                                       08/31/23   NuStep L6, UE/LE,  x 5 min  Prone hip extension 3 x 10 Single leg stance with  vectors 5 x 5 second holds Lateral stepping GTB at knees 6 x 10 feet bilateral Lateral step up 6  inch 2 x 10   Treatment:                                                DATE: 08/28/23  Therapeutic Exercise: NuStep L4, UE/LE,  x 5 min  Lt LAQ with 4#, 5 sec hold, 1x10, 1x8 L hip abdct 2 x 10 SL clams 2x15 bil Bridge 3x5 (held 5"with first set, but had increased pain so stopped) Hooklying piriformis stretch 30s x 2 each LE Side stepping along table with GTB around knees.  Therapeutic Activity: Staggered STS with L placed posterior x10 Standing march 2# 2x10   Treatment:                                                DATE: 08/21/23  Therapeutic Exercise: NuStep L4, UE/LE,  x 5 min  Lt LAQ with 4#, 5 sec hold, 1x10, 1x8 L hip abdct 2 x 10 SL clams 2x15 bil Bridge 3x5 (held 5"with first set, but had increased pain so stopped) Hooklying piriformis stretch 30s x 2 each LE Standing HR/TR 2# cuff weights x20 Therapeutic Activity: Staggered STS with L placed posterior x10 Standing march 2# 2x10    Treatment:                                                DATE: 08/14/23  Therapeutic Exercise: NuStep L5, UE/LE,  x 5 min for warm up - therapist present to monitor and discuss progress Lt LAQ with 3#, 5 sec hold, 2 x 10 L hip abdct 2 x 10 L clams 2 x 10 Bridge with 5 sec hold 2 x 10 Hooklying piriformis stretch 20s x 2 each LE Supine ITB stretch with strap (not tolerated-back pain) Seated hamstring/ITB stretch (not tolerated-back pain) Standing ITB stretch (not tolerated-back pain) Therapeutic Activity: STS with forward arm reach,x 10 - cues to slow speed    Treatment                            08/10/23:  Manual:  TPDN No PROM L hip STM to L gluteal mm and QL There-ex: HSS Piriformis stretch LTR 5" x10ea Supine SLR 2x10L Bridge 2x10 S/l clam 2x10bil Sit to stands 2x10 LAQ 3# 5" 3x10L   08/01/23 Eval and HEP    PATIENT EDUCATION:  Education details: exercise  mechanics. Person educated: Patient Education method: Explanation, Demonstration Education comprehension: verbalized understanding, returned demonstration, verbal cues required, and tactile cues required  HOME  EXERCISE PROGRAM: Access Code: [email or web address] URL: [email or web address]  ASSESSMENT:  CLINICAL IMPRESSION: Pt reported some increase in Lt hip discomfort with resisted standing hip abdct and staggered stance; eased with change in exercise. Improved time for 5x STS; has met this goal.  LEFS improved 4 points.  Patient will continue to benefit from physical therapy in order to improve function and reduce impairment.      From Initial Eval: Patient a 73 y.o. y.o. female who was seen today for physical therapy evaluation and treatment for L hip bursitis. Patient presents with pain limited deficits in L hip strength, ROM, endurance, activity tolerance, and functional mobility with ADL. Patient is having to modify and restrict ADL as indicated by outcome measure score as well as subjective information and objective measures which is affecting overall participation. Patient will benefit from skilled physical therapy in order to improve function and reduce impairment.  OBJECTIVE IMPAIRMENTS: Abnormal gait, decreased activity tolerance, decreased balance, decreased mobility, difficulty walking, decreased strength, impaired flexibility, improper body mechanics, and pain.   ACTIVITY LIMITATIONS: carrying, lifting, bending, standing, squatting, stairs, transfers, hygiene/grooming, locomotion level, and caring for others  PARTICIPATION LIMITATIONS: meal prep, cleaning, laundry, shopping, community activity, and yard work  PERSONAL FACTORS: 3+ comorbidities: Hx Anxiety/depression, HLD, HTN hx CVA  are also affecting patient's functional outcome.   REHAB POTENTIAL: Good  CLINICAL DECISION MAKING: Evolving/moderate complexity  EVALUATION COMPLEXITY: Moderate   GOALS: Goals  reviewed with patient? Yes  SHORT TERM GOALS: Target date: 08/29/2023    Patient will be independent with HEP in order to improve functional outcomes. Baseline: Goal status: MET 2/24  2.  Patient will report at least 25% improvement in symptoms for improved quality of life. Baseline: Goal status:In Progress - 09/04/23    LONG TERM GOALS: Target date: 09/26/2023    Patient will report at least 75% improvement in symptoms for improved quality of life. Baseline:  Goal status: INITIAL  2.  Patient will improve LEFS score by at least 9 points in order to indicate improved tolerance to activity. Baseline: 53/80, see above Goal status:in progress -09/04/23  3.  Patient will demonstrate grade of 5/5 MMT grade in all tested musculature as evidence of improved strength to assist with stair ambulation and gait.   Baseline:  Goal status: INITIAL  4.  Patient will be able to complete 5x STS in under 10 seconds with improved motor control for improved functional strength. Baseline:  Goal status: MET - 09/04/23  5.  Patient will report improved ability to ambulate without pain.  Baseline:  Goal status: INITIAL    PLAN:  PT FREQUENCY: 2x/week  PT DURATION: 8 weeks  PLANNED INTERVENTIONS: 97164- PT Re-evaluation, 97110-Therapeutic exercises, 97530- Therapeutic activity, 97112- Neuromuscular re-education, 97535- Self Care, 16109- Manual therapy, [phone]- Gait training, [phone]- Orthotic Fit/training, [phone]- Canalith repositioning, [id number]- Aquatic Therapy, [phone]- Splinting, Patient/Family education, Balance training, Stair training, Taping, Dry Needling, Joint mobilization, Joint manipulation, Spinal manipulation, Spinal mobilization, Scar mobilization, and DME instructions.  PLAN FOR NEXT SESSION: f/u with HEP, glute and quad strength, core strength, single leg stability  Mayer Camel, PTA 09/04/23 12:40 PM [facility] [address] [place], [place], [address] Phone: [phone]   Fax:  [phone]

## 2023-09-07 ENCOUNTER — Ambulatory Visit (HOSPITAL_BASED_OUTPATIENT_CLINIC_OR_DEPARTMENT_OTHER): Payer: Medicare Other | Admitting: Physical Therapy

## 2023-09-11 ENCOUNTER — Ambulatory Visit (HOSPITAL_BASED_OUTPATIENT_CLINIC_OR_DEPARTMENT_OTHER): Payer: Medicare Other

## 2023-09-14 ENCOUNTER — Ambulatory Visit (HOSPITAL_BASED_OUTPATIENT_CLINIC_OR_DEPARTMENT_OTHER): Payer: Medicare Other | Admitting: Physical Therapy

## 2023-09-14 ENCOUNTER — Encounter (HOSPITAL_BASED_OUTPATIENT_CLINIC_OR_DEPARTMENT_OTHER): Payer: Self-pay | Admitting: Physical Therapy

## 2023-09-14 DIAGNOSIS — R2689 Other abnormalities of gait and mobility: Secondary | ICD-10-CM

## 2023-09-14 DIAGNOSIS — M25552 Pain in left hip: Secondary | ICD-10-CM | POA: Diagnosis not present

## 2023-09-14 DIAGNOSIS — M6281 Muscle weakness (generalized): Secondary | ICD-10-CM

## 2023-09-14 DIAGNOSIS — R29898 Other symptoms and signs involving the musculoskeletal system: Secondary | ICD-10-CM

## 2023-09-14 NOTE — Therapy (Signed)
 OUTPATIENT PHYSICAL THERAPY LOWER EXTREMITY TREATMENT   Patient Name: Marie Walker MRN: [id number] DOB:January 13, 1951, 73 y.o., female Today's Date: 09/14/2023  END OF SESSION:  PT End of Session - 09/14/23 1020     Visit Number 8    Number of Visits 16    Date for PT Re-Evaluation 09/26/23    Authorization Type Medicare    Progress Note Due on Visit 10    PT Start Time 1019    PT Stop Time 1058    PT Time Calculation (min) 39 min    Behavior During Therapy WFL for tasks assessed/performed                Past Medical History:  Diagnosis Date   ADD (attention deficit disorder)    Allergy    Anxiety    Asthma    Depression    Dermatitis    GERD (gastroesophageal reflux disease)    Heart murmur    Hyperlipidemia    Hypertension    Stroke [facility])    Past Surgical History:  Procedure Laterality Date   BREAST BIOPSY  1980   REPLACEMENT TOTAL KNEE BILATERAL     right-2019, left-2009   right foot fracture      TONSILLECTOMY  1954   Patient Active Problem List   Diagnosis Date Noted   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   Pain in right elbow 04/19/2023   Asymptomatic microscopic hematuria 12/15/2022   Acute gout involving toe of left foot 06/08/2021   Asthma without status asthmaticus 06/08/2021   Attention deficit disorder (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 06/08/2021   Chronic gouty arthritis 06/08/2021   Essential hypertension 06/08/2021   Fibromyalgia 06/08/2021   Gastroesophageal reflux disease without esophagitis 06/08/2021   Generalized anxiety disorder 06/08/2021   Gout attack 06/08/2021   Hearing loss 06/08/2021   Muscular fasciculation 06/08/2021   Obesity 06/08/2021   Otitis externa 06/08/2021   Primary localized osteoarthritis of pelvic region and thigh 06/08/2021   Pure hypercholesterolemia 06/08/2021   Seasonal allergic rhinitis 06/08/2021   Lumbar spondylosis 02/23/2021   TIA (transient ischemic attack) 01/29/2021   Low back pain 01/19/2021   Acquired hallux  rigidus 07/10/2019   Closed fracture of base of fifth metatarsal bone 07/10/2019   Pain in left knee 07/10/2019   Dyslipidemia 04/24/2014   Family history of premature CAD 04/24/2014    PCP: No PCP  REFERRING PROVIDER: Samson Frederic, MD  REFERRING DIAG: M70.52 (ICD-10-CM) - Other bursitis of knee, left knee - Pes anserinus bursitis of L knee; Patient stating left hip bursitis is actually the issue.   THERAPY DIAG:  Pain in left hip  Muscle weakness (generalized)  Other abnormalities of gait and mobility  Other symptoms and signs involving the musculoskeletal system  Rationale for Evaluation and Treatment: Rehabilitation  ONSET DATE: chronic   SUBJECTIVE:   SUBJECTIVE STATEMENT:  Pt reports LLE is intermittently a problem. Symptoms are getting better. Doing HEP   Eval: Patient states left hip bursitis causing referred pain down leg. Stroke affected LLE. Has had a shot which has helped in the past but did not help recently. Pain increases with walking. Pain after sitting playing the piano for too long. Hip is affecting the back too. Has to wear back brace with playing the piano.   PERTINENT HISTORY: Hx Anxiety/depression, HLD, HTN, hx CVA affecting LLE PAIN:  Are you having pain? Yes NPRS: 2/10 Location: lower back into L hip    PRECAUTIONS: None  WEIGHT BEARING RESTRICTIONS: No  FALLS:  Has patient fallen in last 6 months? Yes. Number of falls 1  PLOF: Independent  PATIENT GOALS: want to be able to walk without pain, play piano without pain  OBJECTIVE: (objective measures from initial evaluation unless otherwise dated)  PATIENT SURVEYS: LEFS 53/80 ; 09/04/23:  57/80  COGNITION: Overall cognitive status: Within functional limits for tasks assessed     SENSATION: WFL   PALPATION:TTP L glutes, greater troch, lower L lumbar paraspinals  LOWER EXTREMITY ROM: WFL for tasks assessed  Active ROM Right eval Left eval  Hip flexion    Hip extension     Hip abduction    Hip adduction    Hip internal rotation    Hip external rotation    Knee flexion    Knee extension    Ankle dorsiflexion    Ankle plantarflexion    Ankle inversion    Ankle eversion     (Blank rows = not tested) *= pain/symptoms  LOWER EXTREMITY MMT:  MMT Right eval Left eval  Hip flexion 5 4  Hip extension 4 4-  Hip abduction 4- 4-  Hip adduction    Hip internal rotation    Hip external rotation    Knee flexion 5 5  Knee extension 4 4+  Ankle dorsiflexion    Ankle plantarflexion    Ankle inversion    Ankle eversion     (Blank rows = not tested) *= pain/symptoms  FUNCTIONAL TESTS:  5 times sit to stand: 10.54 seconds without UE support, impaired eccentric control, mild dynamic knee valgus Stairs: 7 inch alternating, no UE support, decreased concentric and eccentric strength  09/04/23: 5x STS: 9.53 without support  GAIT: Distance walked: 100 feet Assistive device utilized: None Level of assistance: Complete Independence Comments: WFL   TODAY'S TREATMENT:                                                                                                                              09/04/23 Nustep level 5, UE/LE, x 5 minutes Sidelying clam RTB at knees 3 x 10 SLR 3 x 10 LAQ with 4#, 5 sec hold, 2 x 10 Standing hip abduction RTB at knees 3 x 10 Standing hip extension RTB at knees 3 x 10 Bicep curl to shoulder press 3# 1 x 10  Standing row RTB 2 x 10  Treatment:                                                DATE: 09/04/23  5x STS  LEFS Therapeutic Exercise: NuStep L5, UE/LE,  x 5 min  Hip abdct 2# on ankle 2 x 10 each LE (some pain in L SI) - reduced LE height Hip ext 2# on ankle, 3 x 10 LLE, 2 x 10 RLE Staggered stance with bilat shoulder ext  x 10 with LLE back ,x 5 with RLE back Lt LAQ with 4#, 5 sec hold, 2 x 10 Seated hamstring stretch x 15s each Seated piriformis stretch 15s x 2 each LE  Therapeutic Activity: Staggered STS with L  placed posterior 2 x 5, 3 sec lowering Standing march 2# 2x10 Lateral step ups x 10, with BUE on rail;  x 5 with slow controlled descent  [facility]                                       08/31/23   NuStep L6, UE/LE,  x 5 min  Prone hip extension 3 x 10 Single leg stance with vectors 5 x 5 second holds Lateral stepping GTB at knees 6 x 10 feet bilateral Lateral step up 6  inch 2 x 10   Treatment:                                                DATE: 08/28/23  Therapeutic Exercise: NuStep L4, UE/LE,  x 5 min  Lt LAQ with 4#, 5 sec hold, 1x10, 1x8 L hip abdct 2 x 10 SL clams 2x15 bil Bridge 3x5 (held 5"with first set, but had increased pain so stopped) Hooklying piriformis stretch 30s x 2 each LE Side stepping along table with GTB around knees.  Therapeutic Activity: Staggered STS with L placed posterior x10 Standing march 2# 2x10   Treatment:                                                DATE: 08/21/23  Therapeutic Exercise: NuStep L4, UE/LE,  x 5 min  Lt LAQ with 4#, 5 sec hold, 1x10, 1x8 L hip abdct 2 x 10 SL clams 2x15 bil Bridge 3x5 (held 5"with first set, but had increased pain so stopped) Hooklying piriformis stretch 30s x 2 each LE Standing HR/TR 2# cuff weights x20 Therapeutic Activity: Staggered STS with L placed posterior x10 Standing march 2# 2x10    Treatment:                                                DATE: 08/14/23  Therapeutic Exercise: NuStep L5, UE/LE,  x 5 min for warm up - therapist present to monitor and discuss progress Lt LAQ with 3#, 5 sec hold, 2 x 10 L hip abdct 2 x 10 L clams 2 x 10 Bridge with 5 sec hold 2 x 10 Hooklying piriformis stretch 20s x 2 each LE Supine ITB stretch with strap (not tolerated-back pain) Seated hamstring/ITB stretch (not tolerated-back pain) Standing ITB stretch (not tolerated-back pain) Therapeutic Activity: STS with forward arm reach,x 10 - cues to slow speed    Treatment                             08/10/23:  Manual:  TPDN No PROM L hip STM to L gluteal mm and QL There-ex: HSS Piriformis stretch LTR  5" x10ea Supine SLR 2x10L Bridge 2x10 S/l clam 2x10bil Sit to stands 2x10 LAQ 3# 5" 3x10L   08/01/23 Eval and HEP    PATIENT EDUCATION:  Education details: exercise mechanics. Person educated: Patient Education method: Explanation, Demonstration Education comprehension: verbalized understanding, returned demonstration, verbal cues required, and tactile cues required  HOME EXERCISE PROGRAM: Access Code: [email or web address] URL: [email or web address]  ASSESSMENT:  CLINICAL IMPRESSION: Continued with glute and LE strengthening which is tolerated well. Moderate LE fatigue. Patient requesting UE strengthening exercises to assist with lifting grand child. She performed well today. Patient will continue to benefit from physical therapy in order to improve function and reduce impairment.      From Initial Eval: Patient a 73 y.o. y.o. female who was seen today for physical therapy evaluation and treatment for L hip bursitis. Patient presents with pain limited deficits in L hip strength, ROM, endurance, activity tolerance, and functional mobility with ADL. Patient is having to modify and restrict ADL as indicated by outcome measure score as well as subjective information and objective measures which is affecting overall participation. Patient will benefit from skilled physical therapy in order to improve function and reduce impairment.  OBJECTIVE IMPAIRMENTS: Abnormal gait, decreased activity tolerance, decreased balance, decreased mobility, difficulty walking, decreased strength, impaired flexibility, improper body mechanics, and pain.   ACTIVITY LIMITATIONS: carrying, lifting, bending, standing, squatting, stairs, transfers, hygiene/grooming, locomotion level, and caring for others  PARTICIPATION LIMITATIONS: meal prep, cleaning, laundry, shopping, community activity, and  yard work  PERSONAL FACTORS: 3+ comorbidities: Hx Anxiety/depression, HLD, HTN hx CVA  are also affecting patient's functional outcome.   REHAB POTENTIAL: Good  CLINICAL DECISION MAKING: Evolving/moderate complexity  EVALUATION COMPLEXITY: Moderate   GOALS: Goals reviewed with patient? Yes  SHORT TERM GOALS: Target date: 08/29/2023    Patient will be independent with HEP in order to improve functional outcomes. Baseline: Goal status: MET 2/24  2.  Patient will report at least 25% improvement in symptoms for improved quality of life. Baseline: Goal status:In Progress - 09/04/23    LONG TERM GOALS: Target date: 09/26/2023    Patient will report at least 75% improvement in symptoms for improved quality of life. Baseline:  Goal status: INITIAL  2.  Patient will improve LEFS score by at least 9 points in order to indicate improved tolerance to activity. Baseline: 53/80, see above Goal status:in progress -09/04/23  3.  Patient will demonstrate grade of 5/5 MMT grade in all tested musculature as evidence of improved strength to assist with stair ambulation and gait.   Baseline:  Goal status: INITIAL  4.  Patient will be able to complete 5x STS in under 10 seconds with improved motor control for improved functional strength. Baseline:  Goal status: MET - 09/04/23  5.  Patient will report improved ability to ambulate without pain.  Baseline:  Goal status: INITIAL    PLAN:  PT FREQUENCY: 2x/week  PT DURATION: 8 weeks  PLANNED INTERVENTIONS: 97164- PT Re-evaluation, 97110-Therapeutic exercises, 97530- Therapeutic activity, 97112- Neuromuscular re-education, 97535- Self Care, 16109- Manual therapy, [phone]- Gait training, [phone]- Orthotic Fit/training, [phone]- Canalith repositioning, [id number]- Aquatic Therapy, [phone]- Splinting, Patient/Family education, Balance training, Stair training, Taping, Dry Needling, Joint mobilization, Joint manipulation, Spinal manipulation, Spinal  mobilization, Scar mobilization, and DME instructions.  PLAN FOR NEXT SESSION: f/u with HEP, glute and quad strength, core strength, single leg stability   Wyman Songster, PT 09/14/2023, 10:21 AM  [facility] [address] [place], [place], [address] Phone:  [phone]   Fax:  [phone]

## 2023-09-18 ENCOUNTER — Telehealth (HOSPITAL_BASED_OUTPATIENT_CLINIC_OR_DEPARTMENT_OTHER): Payer: Self-pay

## 2023-09-18 ENCOUNTER — Ambulatory Visit (HOSPITAL_BASED_OUTPATIENT_CLINIC_OR_DEPARTMENT_OTHER): Payer: Medicare Other

## 2023-09-18 NOTE — Telephone Encounter (Signed)
 Called and spoke with patient regarding missed appt. She states she tried to call and cancel but there was no answer and she thought she had left a message. Plans to attend next visit on 3/27.

## 2023-09-21 ENCOUNTER — Encounter (HOSPITAL_BASED_OUTPATIENT_CLINIC_OR_DEPARTMENT_OTHER): Payer: Self-pay | Admitting: Physical Therapy

## 2023-09-21 ENCOUNTER — Ambulatory Visit (HOSPITAL_BASED_OUTPATIENT_CLINIC_OR_DEPARTMENT_OTHER): Payer: Medicare Other | Admitting: Physical Therapy

## 2023-09-21 DIAGNOSIS — M6281 Muscle weakness (generalized): Secondary | ICD-10-CM

## 2023-09-21 DIAGNOSIS — M25552 Pain in left hip: Secondary | ICD-10-CM

## 2023-09-21 DIAGNOSIS — R29898 Other symptoms and signs involving the musculoskeletal system: Secondary | ICD-10-CM

## 2023-09-21 DIAGNOSIS — R2689 Other abnormalities of gait and mobility: Secondary | ICD-10-CM

## 2023-09-21 NOTE — Therapy (Signed)
 OUTPATIENT PHYSICAL THERAPY LOWER EXTREMITY TREATMENT   Patient Name: Marie Walker MRN: [id number] DOB:August 07, 1950, 73 y.o., female Today's Date: 09/21/2023  PHYSICAL THERAPY DISCHARGE SUMMARY  Visits from Start of Care: 9   Current functional level related to goals / functional outcomes: See below   Remaining deficits: See below   Education / Equipment: See below   Patient agrees to discharge. Patient goals were partially met. Patient is being discharged due to  will transition to HEP/self management.   END OF SESSION:  PT End of Session - 09/21/23 1152     Visit Number 9    Number of Visits 16    Date for PT Re-Evaluation 09/26/23    Authorization Type Medicare    Progress Note Due on Visit 10    PT Start Time 1151    PT Stop Time 1229    PT Time Calculation (min) 38 min    Behavior During Therapy WFL for tasks assessed/performed                Past Medical History:  Diagnosis Date   ADD (attention deficit disorder)    Allergy    Anxiety    Asthma    Depression    Dermatitis    GERD (gastroesophageal reflux disease)    Heart murmur    Hyperlipidemia    Hypertension    Stroke [facility])    Past Surgical History:  Procedure Laterality Date   BREAST BIOPSY  1980   REPLACEMENT TOTAL KNEE BILATERAL     right-2019, left-2009   right foot fracture      TONSILLECTOMY  1954   Patient Active Problem List   Diagnosis Date Noted   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   Pain in right elbow 04/19/2023   Asymptomatic microscopic hematuria 12/15/2022   Acute gout involving toe of left foot 06/08/2021   Asthma without status asthmaticus 06/08/2021   Attention deficit disorder (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 06/08/2021   Chronic gouty arthritis 06/08/2021   Essential hypertension 06/08/2021   Fibromyalgia 06/08/2021   Gastroesophageal reflux disease without esophagitis 06/08/2021   Generalized anxiety disorder 06/08/2021   Gout attack 06/08/2021   Hearing loss 06/08/2021    Muscular fasciculation 06/08/2021   Obesity 06/08/2021   Otitis externa 06/08/2021   Primary localized osteoarthritis of pelvic region and thigh 06/08/2021   Pure hypercholesterolemia 06/08/2021   Seasonal allergic rhinitis 06/08/2021   Lumbar spondylosis 02/23/2021   TIA (transient ischemic attack) 01/29/2021   Low back pain 01/19/2021   Acquired hallux rigidus 07/10/2019   Closed fracture of base of fifth metatarsal bone 07/10/2019   Pain in left knee 07/10/2019   Dyslipidemia 04/24/2014   Family history of premature CAD 04/24/2014    PCP: No PCP  REFERRING PROVIDER: Samson Frederic, MD  REFERRING DIAG: M70.52 (ICD-10-CM) - Other bursitis of knee, left knee - Pes anserinus bursitis of L knee; Patient stating left hip bursitis is actually the issue.   THERAPY DIAG:  Pain in left hip  Muscle weakness (generalized)  Other abnormalities of gait and mobility  Other symptoms and signs involving the musculoskeletal system  Rationale for Evaluation and Treatment: Rehabilitation  ONSET DATE: chronic   SUBJECTIVE:   SUBJECTIVE STATEMENT:  Pt reports leg is painful sometimes. Sitting in the car for a long time makes things worse. Unable to quantify percent improvement. She feels like she needs to work out more.    Eval: Patient states left hip bursitis causing referred pain down leg. Stroke affected  LLE. Has had a shot which has helped in the past but did not help recently. Pain increases with walking. Pain after sitting playing the piano for too long. Hip is affecting the back too. Has to wear back brace with playing the piano.   PERTINENT HISTORY: Hx Anxiety/depression, HLD, HTN, hx CVA affecting LLE PAIN:  Are you having pain? Yes NPRS: 5/10 Location: lower back into L hip    PRECAUTIONS: None  WEIGHT BEARING RESTRICTIONS: No  FALLS:  Has patient fallen in last 6 months? Yes. Number of falls 1  PLOF: Independent  PATIENT GOALS: want to be able to walk  without pain, play piano without pain  OBJECTIVE: (objective measures from initial evaluation unless otherwise dated)  PATIENT SURVEYS: LEFS 53/80 ; 09/04/23:  57/80; 09/21/23: 51/80  COGNITION: Overall cognitive status: Within functional limits for tasks assessed     SENSATION: WFL   PALPATION:TTP L glutes, greater troch, lower L lumbar paraspinals  LOWER EXTREMITY ROM: WFL for tasks assessed  Active ROM Right eval Left eval  Hip flexion    Hip extension    Hip abduction    Hip adduction    Hip internal rotation    Hip external rotation    Knee flexion    Knee extension    Ankle dorsiflexion    Ankle plantarflexion    Ankle inversion    Ankle eversion     (Blank rows = not tested) *= pain/symptoms  LOWER EXTREMITY MMT:  MMT Right eval Left eval Right 09/21/23 Left 09/21/23  Hip flexion 5 4 5 5   Hip extension 4 4- 4 4  Hip abduction 4- 4- 4 4  Hip adduction      Hip internal rotation      Hip external rotation      Knee flexion 5 5 5 5   Knee extension 4 4+ 5 5  Ankle dorsiflexion      Ankle plantarflexion      Ankle inversion      Ankle eversion       (Blank rows = not tested) *= pain/symptoms  FUNCTIONAL TESTS:  5 times sit to stand: 10.54 seconds without UE support, impaired eccentric control, mild dynamic knee valgus Stairs: 7 inch alternating, no UE support, decreased concentric and eccentric strength  09/04/23: 5x STS: 9.53 without support  GAIT: Distance walked: 100 feet Assistive device utilized: None Level of assistance: Complete Independence Comments: WFL   TODAY'S TREATMENT:                                                                                                                              09/21/23 Nustep level 5, UE/LE, x 5 minutes Reassessment and discussion of POC  09/14/23 Nustep level 5, UE/LE, x 5 minutes Sidelying clam RTB at knees 3 x 10 SLR 3 x 10 LAQ with 4#, 5 sec hold, 2 x 10 Standing hip abduction RTB at knees 3 x  10 Standing hip  extension RTB at knees 3 x 10 Bicep curl to shoulder press 3# 1 x 10  Standing row RTB 2 x 10  Treatment:                                                DATE: 09/04/23  5x STS  LEFS Therapeutic Exercise: NuStep L5, UE/LE,  x 5 min  Hip abdct 2# on ankle 2 x 10 each LE (some pain in L SI) - reduced LE height Hip ext 2# on ankle, 3 x 10 LLE, 2 x 10 RLE Staggered stance with bilat shoulder ext x 10 with LLE back ,x 5 with RLE back Lt LAQ with 4#, 5 sec hold, 2 x 10 Seated hamstring stretch x 15s each Seated piriformis stretch 15s x 2 each LE  Therapeutic Activity: Staggered STS with L placed posterior 2 x 5, 3 sec lowering Standing march 2# 2x10 Lateral step ups x 10, with BUE on rail;  x 5 with slow controlled descent  [facility] PT                                       08/31/23   NuStep L6, UE/LE,  x 5 min  Prone hip extension 3 x 10 Single leg stance with vectors 5 x 5 second holds Lateral stepping GTB at knees 6 x 10 feet bilateral Lateral step up 6  inch 2 x 10   Treatment:                                                DATE: 08/28/23  Therapeutic Exercise: NuStep L4, UE/LE,  x 5 min  Lt LAQ with 4#, 5 sec hold, 1x10, 1x8 L hip abdct 2 x 10 SL clams 2x15 bil Bridge 3x5 (held 5"with first set, but had increased pain so stopped) Hooklying piriformis stretch 30s x 2 each LE Side stepping along table with GTB around knees.  Therapeutic Activity: Staggered STS with L placed posterior x10 Standing march 2# 2x10   Treatment:                                                DATE: 08/21/23  Therapeutic Exercise: NuStep L4, UE/LE,  x 5 min  Lt LAQ with 4#, 5 sec hold, 1x10, 1x8 L hip abdct 2 x 10 SL clams 2x15 bil Bridge 3x5 (held 5"with first set, but had increased pain so stopped) Hooklying piriformis stretch 30s x 2 each LE Standing HR/TR 2# cuff weights x20 Therapeutic Activity: Staggered STS with L placed posterior x10 Standing march 2#  2x10    Treatment:                                                DATE: 08/14/23  Therapeutic Exercise: NuStep L5, UE/LE,  x 5 min for warm up - therapist present to  monitor and discuss progress Lt LAQ with 3#, 5 sec hold, 2 x 10 L hip abdct 2 x 10 L clams 2 x 10 Bridge with 5 sec hold 2 x 10 Hooklying piriformis stretch 20s x 2 each LE Supine ITB stretch with strap (not tolerated-back pain) Seated hamstring/ITB stretch (not tolerated-back pain) Standing ITB stretch (not tolerated-back pain) Therapeutic Activity: STS with forward arm reach,x 10 - cues to slow speed    Treatment                            08/10/23:  Manual:  TPDN No PROM L hip STM to L gluteal mm and QL There-ex: HSS Piriformis stretch LTR 5" x10ea Supine SLR 2x10L Bridge 2x10 S/l clam 2x10bil Sit to stands 2x10 LAQ 3# 5" 3x10L   08/01/23 Eval and HEP    PATIENT EDUCATION:  Education details: exercise mechanics. Person educated: Patient Education method: Explanation, Demonstration Education comprehension: verbalized understanding, returned demonstration, verbal cues required, and tactile cues required  HOME EXERCISE PROGRAM: Access Code: [email or web address] URL: [email or web address]  ASSESSMENT:  CLINICAL IMPRESSION: Patient has met 1/1 short term goals and 1/4 long term goals with ability to complete HEP and improvement in symptoms and functional mobility. Remaining goals not met due to continued deficits in intermittent symptoms , strength, ROM, activity tolerance, and functional mobility. Patient has made good progress toward remaining goals especially in strength and she is now equal bilaterally. Deferred improvement goals as patient unable to quantify although states improvement. Discussed POC with patient and she is agreeable to transition to HEP and will continue to go to the gym. Instructed her to return to PT if needed. Patient discharged from PT at this time.        From Initial  Eval: Patient a 73 y.o. y.o. female who was seen today for physical therapy evaluation and treatment for L hip bursitis. Patient presents with pain limited deficits in L hip strength, ROM, endurance, activity tolerance, and functional mobility with ADL. Patient is having to modify and restrict ADL as indicated by outcome measure score as well as subjective information and objective measures which is affecting overall participation. Patient will benefit from skilled physical therapy in order to improve function and reduce impairment.  OBJECTIVE IMPAIRMENTS: Abnormal gait, decreased activity tolerance, decreased balance, decreased mobility, difficulty walking, decreased strength, impaired flexibility, improper body mechanics, and pain.   ACTIVITY LIMITATIONS: carrying, lifting, bending, standing, squatting, stairs, transfers, hygiene/grooming, locomotion level, and caring for others  PARTICIPATION LIMITATIONS: meal prep, cleaning, laundry, shopping, community activity, and yard work  PERSONAL FACTORS: 3+ comorbidities: Hx Anxiety/depression, HLD, HTN hx CVA  are also affecting patient's functional outcome.   REHAB POTENTIAL: Good  CLINICAL DECISION MAKING: Evolving/moderate complexity  EVALUATION COMPLEXITY: Moderate   GOALS: Goals reviewed with patient? Yes  SHORT TERM GOALS: Target date: 08/29/2023    Patient will be independent with HEP in order to improve functional outcomes. Baseline: Goal status: MET 2/24  2.  Patient will report at least 25% improvement in symptoms for improved quality of life. Baseline: Goal status:Deferred - patient unable to quantify    LONG TERM GOALS: Target date: 09/26/2023    Patient will report at least 75% improvement in symptoms for improved quality of life. Baseline:  Goal status::Deferred - patient unable to quantify  2.  Patient will improve LEFS score by at least 9 points in order to indicate improved tolerance to activity. Baseline: 53/80,  see above;  09/21/23: 51/80 Goal status:in progress   3.  Patient will demonstrate grade of 5/5 MMT grade in all tested musculature as evidence of improved strength to assist with stair ambulation and gait.   Baseline:  Goal status: INITIAL  4.  Patient will be able to complete 5x STS in under 10 seconds with improved motor control for improved functional strength. Baseline:  Goal status: MET - 09/04/23  5.  Patient will report improved ability to ambulate without pain.  Baseline:  Goal status: INITIAL    PLAN:  PT FREQUENCY: 2x/week  PT DURATION: 8 weeks  PLANNED INTERVENTIONS: 97164- PT Re-evaluation, 97110-Therapeutic exercises, 97530- Therapeutic activity, 97112- Neuromuscular re-education, 97535- Self Care, 16109- Manual therapy, [phone]- Gait training, [phone]- Orthotic Fit/training, [phone]- Canalith repositioning, [id number]- Aquatic Therapy, [phone]- Splinting, Patient/Family education, Balance training, Stair training, Taping, Dry Needling, Joint mobilization, Joint manipulation, Spinal manipulation, Spinal mobilization, Scar mobilization, and DME instructions.  PLAN FOR NEXT SESSION: f/u with HEP, glute and quad strength, core strength, single leg stability   Wyman Songster, PT 09/21/2023, 12:28 PM  [facility] [address] [place], [place], [address] Phone: [phone]   Fax:  [phone]

## 2023-09-25 ENCOUNTER — Encounter (HOSPITAL_BASED_OUTPATIENT_CLINIC_OR_DEPARTMENT_OTHER): Payer: Medicare Other

## 2023-09-28 ENCOUNTER — Encounter (HOSPITAL_BASED_OUTPATIENT_CLINIC_OR_DEPARTMENT_OTHER): Payer: Medicare Other | Admitting: Physical Therapy

## 2023-11-08 ENCOUNTER — Other Ambulatory Visit: Payer: Self-pay | Admitting: Internal Medicine

## 2023-12-03 ENCOUNTER — Encounter (HOSPITAL_COMMUNITY): Payer: Self-pay

## 2023-12-03 ENCOUNTER — Ambulatory Visit (HOSPITAL_COMMUNITY)
Admission: RE | Admit: 2023-12-03 | Discharge: 2023-12-03 | Disposition: A | Discharge status: Home / Self Care | Payer: Self-pay | Source: Ambulatory Visit

## 2023-12-03 VITALS — BP 128/65 | HR 94 | Temp 97.8°F | Resp 18

## 2023-12-03 DIAGNOSIS — H6122 Impacted cerumen, left ear: Secondary | ICD-10-CM

## 2023-12-03 MED ORDER — CARBAMIDE PEROXIDE 6.5 % OT SOLN: 5.0000 [drp] | Freq: Two times a day (BID) | OTIC | 0 refills | Status: AC

## 2023-12-03 NOTE — ED Triage Notes (Signed)
 Patient presents with c/o left ear fullness and sore throat x 3 days.

## 2023-12-03 NOTE — Discharge Instructions (Signed)
 Debrox ear drops -- 5 drops into the left ear, twice daily, for 5 days in a row  Please return after 5 days if you still have symptoms and we can attempt irrigation.

## 2023-12-03 NOTE — ED Provider Notes (Signed)
 [facility]    CSN: [id number] Arrival date & time: 12/03/23  1443      History   Chief Complaint Chief Complaint  Patient presents with   Ear Fullness    Ear ache and tenderness. - Entered by patient   Sore Throat    HPI Marie Walker is a 73 y.o. female.  3-day history of left ear tenderness with movement, earache and fullness sensation.  Started to feel it down the side of the left neck.  Not having fever or sore throat.  No cough. 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   Pain in right elbow 04/19/2023   Asymptomatic microscopic hematuria 12/15/2022   Acute gout involving toe of left foot 06/08/2021   Asthma without status asthmaticus 06/08/2021   Attention deficit disorder (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 06/08/2021   Chronic gouty arthritis 06/08/2021   Essential hypertension 06/08/2021   Fibromyalgia 06/08/2021   Gastroesophageal reflux disease without esophagitis 06/08/2021   Generalized anxiety disorder 06/08/2021   Gout attack 06/08/2021   Hearing loss 06/08/2021   Muscular fasciculation 06/08/2021   Obesity 06/08/2021   Otitis externa 06/08/2021   Primary localized osteoarthritis of pelvic region and thigh 06/08/2021   Pure hypercholesterolemia 06/08/2021   Seasonal allergic rhinitis 06/08/2021   Lumbar spondylosis 02/23/2021   TIA (transient ischemic attack) 01/29/2021   Low back pain 01/19/2021   Acquired hallux rigidus 07/10/2019   Closed fracture of base of fifth metatarsal bone 07/10/2019   Pain in left knee 07/10/2019   Dyslipidemia 04/24/2014   Family history of premature CAD 04/24/2014    Past Surgical History:  Procedure  Laterality Date   BREAST BIOPSY  1980   REPLACEMENT TOTAL KNEE BILATERAL     right-2019, left-2009   right foot fracture      TONSILLECTOMY  1954    OB History   No obstetric history on file.      Home Medications    Prior to Admission medications   Medication Sig Start Date End Date Taking? Authorizing Provider  carbamide peroxide (DEBROX) 6.5 % OTIC solution Place 5 drops into the left ear 2 (two) times daily for 5 days. 12/03/23 12/08/23 Yes Jazzmen Restivo, Ivette Marks, PA-C  traZODone (DESYREL) 50 MG tablet 1 tablet at bedtime as needed Orally Once a day 10/27/23  Yes [provider]  ADDERALL XR 15 MG 24 hr capsule 1 capsule in the morning Orally Once a day for 90 days 02/28/23   [provider]  albuterol (VENTOLIN HFA) 108 (90 Base) MCG/ACT inhaler every 4 (four) hours as needed.  11/05/19   [provider]  amLODipine (NORVASC) 10 MG tablet amlodipine 10 mg tablet 09/03/19   [provider]  aspirin 81 MG EC tablet Take by mouth once. Tablet 09/01/19   [provider]  atorvastatin  (LIPITOR) 40 MG tablet Take 60 mg by mouth daily.    [provider]  B COMPLEX VITAMINS ER PO Take by mouth.    [provider]  CALCIUM  PO Take by mouth.    [provider]  cetirizine  (ZYRTEC ) 10 MG chewable tablet     [provider]  cetirizine  (ZYRTEC ) 10 MG tablet TAKE 1 TABLET BY MOUTH EVERY DAY. OTC NOT COVERED 12/30/22   Desai, Nikita S, MD  Ergocalciferol 10 MCG (400 UNIT) TABS Take 1,000 mcg by mouth daily. 01/29/21   [provider]  finasteride (PROSCAR) 5 MG tablet Take 2.5 mg by mouth daily. 08/10/23   [provider]  fluticasone  (FLONASE ) 50 MCG/ACT nasal spray Place 1 spray into both nostrils daily. 01/31/22   Desai, Nikita S, MD  hydrOXYzine (ATARAX) 25 MG tablet 1/2-1 tablet Orally every 8 hours as needed for anxiety or itching    [provider]  Magnesium 250 MG TABS 1 tablet with a meal Orally Once  a day    [provider]  montelukast  (SINGULAIR ) 10 MG tablet TAKE 1 TABLET BY MOUTH EVERYDAY AT BEDTIME 03/31/23   Desai, Nikita S, MD  Multiple Vitamin (MULTIVITAMIN) tablet Take 1 tablet by mouth daily.    [provider]  pantoprazole (PROTONIX) 40 MG tablet Take 40 mg by mouth daily. 12/19/19   [provider]  traMADol (ULTRAM) 50 MG tablet Take 50 mg by mouth 3 (three) times daily as needed. 05/02/23   [provider]    Family History Family History  Problem Relation Age of Onset   Hypertension Mother    Hyperlipidemia Mother    Hearing loss Mother    Cancer Mother    Arthritis Mother    Heart attack Mother    Stroke Father    Heart attack Father    Hearing loss Father    Cancer Father    Alzheimer's disease Father    Depression Sister    Stroke Brother    Hypertension Brother    Arthritis Brother    Depression Brother    Heart attack Maternal Grandmother    Hearing loss Maternal Grandfather    Cancer Paternal Grandmother    Hearing loss Paternal Grandmother    Alcohol abuse Paternal Grandmother    Vision loss Paternal Grandmother    Early death Paternal Grandfather    Lung disease Neg Hx     Social History Social History   Tobacco Use   Smoking status: Never   Smokeless tobacco: Never  Vaping Use   Vaping status: Never Used  Substance Use Topics   Alcohol use: Yes    Alcohol/week: 2.0 standard drinks of alcohol    Types: 1 Glasses of wine, 1 Shots of liquor per week    Comment: ocassionally   Drug use: Not Currently     Allergies   Doxycycline, Molds & smuts, Morphine, Orphenadrine, Oxycodone-acetaminophen, Augmentin [amoxicillin-pot clavulanate], Orphenadrine citrate, Other, and Sulfamethoxazole -trimethoprim    Review of Systems Review of Systems Per HPI  Physical Exam Triage Vital Signs ED Triage Vitals  Encounter Vitals Group     BP 12/03/23 1458 128/65     Systolic BP Percentile --      Diastolic BP  Percentile --      Pulse Rate 12/03/23 1458 94     Resp 12/03/23 1458 18     Temp 12/03/23 1458 97.8 F (36.6 C)     Temp Source 12/03/23 1458 Oral     SpO2 12/03/23 1458 98 %     Weight --      Height --      Head Circumference --      Peak Flow --      Pain Score 12/03/23 1500  0     Pain Loc --      Pain Education --      Exclude from Growth Chart --    No data found.  Updated Vital Signs BP 128/65 (BP Location: Left Arm)   Pulse 94   Temp 97.8 F (36.6 C) (Oral)   Resp 18   SpO2 98%   Physical Exam Vitals and nursing note reviewed.  Constitutional:      General: She is not in acute distress.    Appearance: She is not ill-appearing.  HENT:     Ears:     Comments: Large amount of wax in bilateral ears, L > R.  TMs are not visualized.  There is no tenderness with movement of pinna or tragus.  With otoscope tip in the canal there is some discomfort    Mouth/Throat:     Comments: Patient does not have tonsils.  No erythema Eyes:     Extraocular Movements: Extraocular movements intact.     Pupils: Pupils are equal, round, and reactive to light.  Cardiovascular:     Rate and Rhythm: Normal rate and regular rhythm.  Pulmonary:     Effort: Pulmonary effort is normal.  Musculoskeletal:     Cervical back: Normal range of motion.  Skin:    General: Skin is warm and dry.  Neurological:     Mental Status: She is alert and oriented to person, place, and time.     UC Treatments / Results  Labs (all labs ordered are listed, but only abnormal results are displayed) Labs Reviewed - No data to display  EKG   Radiology No results found.  Procedures Procedures (including critical care time)  Medications Ordered in UC Medications - No data to display  Initial Impression / Assessment and Plan / UC Course  I have reviewed the triage vital signs and the nursing notes.  Pertinent labs & imaging results that were available during my care of the patient were reviewed by  me and considered in my medical decision making (see chart for details).  Excessive earwax bilateral ears.  She has discomfort with otoscope exam.  I did offer irrigation, however with the discomfort she may not tolerate.  Reports she has had ear irrigation in the past and it was not comfortable.  For now I have recommended using Debrox drops twice daily for 5 days and reassessing symptoms.  She can return here after the 5 days and we can attempt irrigation and further exam etc.  Patient is agreeable with this plan, no questions  Final Clinical Impressions(s) / UC Diagnoses   Final diagnoses:  Excessive ear wax, left     Discharge Instructions      Debrox ear drops -- 5 drops into the left ear, twice daily, for 5 days in a row  Please return after 5 days if you still have symptoms and we can attempt irrigation.    ED Prescriptions     Medication Sig Dispense Auth. Provider   carbamide peroxide (DEBROX) 6.5 % OTIC solution Place 5 drops into the left ear 2 (two) times daily for 5 days. 2.5 mL Angelique Chevalier, Ivette Marks, PA-C      PDMP not reviewed this encounter.   Albert Devaul, Ivette Marks, [place] 12/03/23 1825

## 2023-12-05 ENCOUNTER — Ambulatory Visit (INDEPENDENT_AMBULATORY_CARE_PROVIDER_SITE_OTHER): Admitting: Podiatry

## 2023-12-05 ENCOUNTER — Ambulatory Visit (INDEPENDENT_AMBULATORY_CARE_PROVIDER_SITE_OTHER)

## 2023-12-05 DIAGNOSIS — M7750 Other enthesopathy of unspecified foot: Secondary | ICD-10-CM

## 2023-12-05 DIAGNOSIS — M722 Plantar fascial fibromatosis: Secondary | ICD-10-CM | POA: Diagnosis not present

## 2023-12-05 DIAGNOSIS — M19079 Primary osteoarthritis, unspecified ankle and foot: Secondary | ICD-10-CM

## 2023-12-05 DIAGNOSIS — M19072 Primary osteoarthritis, left ankle and foot: Secondary | ICD-10-CM

## 2023-12-05 DIAGNOSIS — Q666 Other congenital valgus deformities of feet: Secondary | ICD-10-CM

## 2023-12-05 NOTE — Patient Instructions (Signed)
 Look at [facility] or [facility] for shoes at home  --  You can use VOLTAREN GEL on the area  --  Plantar Fasciitis (Heel Spur Syndrome) with Rehab The plantar fascia is a fibrous, ligament-like, soft-tissue structure that spans the bottom of the foot. Plantar fasciitis is a condition that causes pain in the foot due to inflammation of the tissue. SYMPTOMS  Pain and tenderness on the underneath side of the foot. Pain that worsens with standing or walking. CAUSES  Plantar fasciitis is caused by irritation and injury to the plantar fascia on the underneath side of the foot. Common mechanisms of injury include: Direct trauma to bottom of the foot. Damage to a small nerve that runs under the foot where the main fascia attaches to the heel bone. Stress placed on the plantar fascia due to bone spurs. RISK INCREASES WITH:  Activities that place stress on the plantar fascia (running, jumping, pivoting, or cutting). Poor strength and flexibility. Improperly fitted shoes. Tight calf muscles. Flat feet. Failure to warm-up properly before activity. Obesity. PREVENTION Warm up and stretch properly before activity. Allow for adequate recovery between workouts. Maintain physical fitness: Strength, flexibility, and endurance. Cardiovascular fitness. Maintain a health body weight. Avoid stress on the plantar fascia. Wear properly fitted shoes, including arch supports for individuals who have flat feet.  PROGNOSIS  If treated properly, then the symptoms of plantar fasciitis usually resolve without surgery. However, occasionally surgery is necessary.  RELATED COMPLICATIONS  Recurrent symptoms that may result in a chronic condition. Problems of the lower back that are caused by compensating for the injury, such as limping. Pain or weakness of the foot during push-off following surgery. Chronic inflammation, scarring, and partial or complete fascia tear, occurring more often from repeated  injections.  TREATMENT  Treatment initially involves the use of ice and medication to help reduce pain and inflammation. The use of strengthening and stretching exercises may help reduce pain with activity, especially stretches of the Achilles tendon. These exercises may be performed at home or with a therapist. Your caregiver may recommend that you use heel cups of arch supports to help reduce stress on the plantar fascia. Occasionally, corticosteroid injections are given to reduce inflammation. If symptoms persist for greater than 6 months despite non-surgical (conservative), then surgery may be recommended.   MEDICATION  If pain medication is necessary, then nonsteroidal anti-inflammatory medications, such as aspirin and ibuprofen, or other minor pain relievers, such as acetaminophen, are often recommended. Do not take pain medication within 7 days before surgery. Prescription pain relievers may be given if deemed necessary by your caregiver. Use only as directed and only as much as you need. Corticosteroid injections may be given by your caregiver. These injections should be reserved for the most serious cases, because they may only be given a certain number of times.  HEAT AND COLD Cold treatment (icing) relieves pain and reduces inflammation. Cold treatment should be applied for 10 to 15 minutes every 2 to 3 hours for inflammation and pain and immediately after any activity that aggravates your symptoms. Use ice packs or massage the area with a piece of ice (ice massage). Heat treatment may be used prior to performing the stretching and strengthening activities prescribed by your caregiver, physical therapist, or athletic trainer. Use a heat pack or soak the injury in warm water.  SEEK IMMEDIATE MEDICAL CARE IF: Treatment seems to offer no benefit, or the condition worsens. Any medications produce adverse side effects.  EXERCISES- RANGE OF MOTION (  ROM) AND STRETCHING EXERCISES - Plantar  Fasciitis (Heel Spur Syndrome) These exercises may help you when beginning to rehabilitate your injury. Your symptoms may resolve with or without further involvement from your physician, physical therapist or athletic trainer. While completing these exercises, remember:  Restoring tissue flexibility helps normal motion to return to the joints. This allows healthier, less painful movement and activity. An effective stretch should be held for at least 30 seconds. A stretch should never be painful. You should only feel a gentle lengthening or release in the stretched tissue.  RANGE OF MOTION - Toe Extension, Flexion Sit with your right / left leg crossed over your opposite knee. Grasp your toes and gently pull them back toward the top of your foot. You should feel a stretch on the bottom of your toes and/or foot. Hold this stretch for 10 seconds. Now, gently pull your toes toward the bottom of your foot. You should feel a stretch on the top of your toes and or foot. Hold this stretch for 10 seconds. Repeat  times. Complete this stretch 3 times per day.   RANGE OF MOTION - Ankle Dorsiflexion, Active Assisted Remove shoes and sit on a chair that is preferably not on a carpeted surface. Place right / left foot under knee. Extend your opposite leg for support. Keeping your heel down, slide your right / left foot back toward the chair until you feel a stretch at your ankle or calf. If you do not feel a stretch, slide your bottom forward to the edge of the chair, while still keeping your heel down. Hold this stretch for 10 seconds. Repeat 3 times. Complete this stretch 2 times per day.   STRETCH  Gastroc, Standing Place hands on wall. Extend right / left leg, keeping the front knee somewhat bent. Slightly point your toes inward on your back foot. Keeping your right / left heel on the floor and your knee straight, shift your weight toward the wall, not allowing your back to arch. You should feel a  gentle stretch in the right / left calf. Hold this position for 10 seconds. Repeat 3 times. Complete this stretch 2 times per day.  STRETCH  Soleus, Standing Place hands on wall. Extend right / left leg, keeping the other knee somewhat bent. Slightly point your toes inward on your back foot. Keep your right / left heel on the floor, bend your back knee, and slightly shift your weight over the back leg so that you feel a gentle stretch deep in your back calf. Hold this position for 10 seconds. Repeat 3 times. Complete this stretch 2 times per day.  STRETCH  Gastrocsoleus, Standing  Note: This exercise can place a lot of stress on your foot and ankle. Please complete this exercise only if specifically instructed by your caregiver.  Place the ball of your right / left foot on a step, keeping your other foot firmly on the same step. Hold on to the wall or a rail for balance. Slowly lift your other foot, allowing your body weight to press your heel down over the edge of the step. You should feel a stretch in your right / left calf. Hold this position for 10 seconds. Repeat this exercise with a slight bend in your right / left knee. Repeat 3 times. Complete this stretch 2 times per day.   STRENGTHENING EXERCISES - Plantar Fasciitis (Heel Spur Syndrome)  These exercises may help you when beginning to rehabilitate your injury. They may resolve  your symptoms with or without further involvement from your physician, physical therapist or athletic trainer. While completing these exercises, remember:  Muscles can gain both the endurance and the strength needed for everyday activities through controlled exercises. Complete these exercises as instructed by your physician, physical therapist or athletic trainer. Progress the resistance and repetitions only as guided.  STRENGTH - Towel Curls Sit in a chair positioned on a non-carpeted surface. Place your foot on a towel, keeping your heel on the  floor. Pull the towel toward your heel by only curling your toes. Keep your heel on the floor. Repeat 3 times. Complete this exercise 2 times per day.  STRENGTH - Ankle Inversion Secure one end of a rubber exercise band/tubing to a fixed object (table, pole). Loop the other end around your foot just before your toes. Place your fists between your knees. This will focus your strengthening at your ankle. Slowly, pull your big toe up and in, making sure the band/tubing is positioned to resist the entire motion. Hold this position for 10 seconds. Have your muscles resist the band/tubing as it slowly pulls your foot back to the starting position. Repeat 3 times. Complete this exercises 2 times per day.  Document Released: 06/13/2005 Document Revised: 09/05/2011 Document Reviewed: 09/25/2008 [facility] Patient Information 2014 [place], [place].

## 2023-12-07 NOTE — Progress Notes (Signed)
  Subjective:  Patient ID: Marie Walker, female    DOB: [birth year],  MRN: [id number]  Chief Complaint  Patient presents with   Foot Pain    RM 13 Patient is here for left foot pain of the fifth metatarsal. Patient states pain is around 5 on a scale of 1-10 when walking and standing. Currently using shoes with insoles for some relief.    Discussed the use of AI scribe software for clinical note transcription with the patient, who gave verbal consent to proceed.  History of Present Illness Marie Walker is a 73 year old female with flat feet and heel spurs who presents with left foot pain.  She experiences persistent pain at the base of her left foot, ongoing since last year, with the left foot more affected than the right. The pain persists despite previous interventions. Insoles provide some relief, making her feel fairly comfortable in shoes, but pain persists when barefoot, in the mornings, or when walking around the house.    Flat feet and heel spurs contribute to her discomfort. Certain positions, like lying on her left side, exacerbate the pain. No pain radiates up the leg or in the back of the foot.  She has previously used Voltaren gel for pain relief and wears sneakers most of the time to avoid exacerbating her symptoms.      Objective:    Physical Exam General: AAO x3, NAD  Dermatological: Skin is warm, dry and supple bilateral. There are no open sores, no preulcerative lesions, no rash or signs of infection present.  Vascular: Dorsalis Pedis artery and Posterior Tibial artery pedal pulses are 2/4 bilateral with immedate capillary fill time. There is no pain with calf compression, swelling, warmth, erythema.   Neruologic: Grossly intact via light touch bilateral.   Musculoskeletal: There is tenderness palpation of the fifth metatarsal base on the left foot there is prominent spurring noted.  There is no edema, erythema.  There is no pain on the peroneal tendon.  She  gets some discomfort on plantar aspect of calcaneus along insertion of plantar fascia.  There is no area pinpoint tenderness identified otherwise.  There is no edema bilaterally.  MMT 5/5.  Gait: Unassisted, Nonantalgic.     No images are attached to the encounter.    Results RADIOLOGY Foot X-ray: Calcaneal spur, first metatarsophalangeal joint arthritis, osteophytes on the first metatarsophalangeal joint, hallux valgus, tailor's bunion, small osteophytes on the fifth metatarsal   Assessment:   1. Pes planovalgus   2. Tendonitis of ankle or foot   3. Plantar fasciitis      Plan:  Patient was evaluated and treated and all questions answered.  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 Conservative management preferred over surgery. Addressing inflammation and structural issues is crucial. - Recommend insoles for arch support. - Advise against going barefoot or wearing flat shoes. - Suggest [employer] or Vionic shoes for arch support and comfort at home to help provide support. - Prescribe Voltaren gel for topical anti-inflammatory treatment. - Consider steroid injection if conservative measures fail. - Encourage icing to manage inflammation.   Return if symptoms worsen or fail to improve.   Charity Conch DPM

## 2024-02-20 ENCOUNTER — Ambulatory Visit (HOSPITAL_COMMUNITY): Payer: Self-pay

## 2024-02-29 ENCOUNTER — Ambulatory Visit (HOSPITAL_COMMUNITY)
Admission: RE | Admit: 2024-02-29 | Discharge: 2024-02-29 | Disposition: A | Discharge status: Home / Self Care | Source: Ambulatory Visit | Attending: Family Medicine | Admitting: Family Medicine

## 2024-02-29 ENCOUNTER — Encounter (HOSPITAL_COMMUNITY): Payer: Self-pay

## 2024-02-29 VITALS — BP 109/77 | HR 105 | Temp 97.6°F | Resp 18

## 2024-02-29 DIAGNOSIS — K5900 Constipation, unspecified: Secondary | ICD-10-CM | POA: Insufficient documentation

## 2024-02-29 LAB — COMPREHENSIVE METABOLIC PANEL WITH GFR
ALT: 28 U/L (ref 0–44)
AST: 39 U/L (ref 15–41)
Albumin: 3.9 g/dL (ref 3.5–5.0)
Alkaline Phosphatase: 89 U/L (ref 38–126)
Anion gap: 10 (ref 5–15)
BUN: 14 mg/dL (ref 8–23)
CO2: 28 mmol/L (ref 22–32)
Calcium: 10 mg/dL (ref 8.9–10.3)
Chloride: 102 mmol/L (ref 98–111)
Creatinine, Ser: 1.26 mg/dL — ABNORMAL HIGH (ref 0.44–1.00)
GFR, Estimated: 45 mL/min — ABNORMAL LOW (ref >60)
Glucose, Bld: 89 mg/dL (ref 70–99)
Potassium: 4 mmol/L (ref 3.5–5.1)
Sodium: 140 mmol/L (ref 135–145)
Total Bilirubin: 0.9 mg/dL (ref 0.0–1.2)
Total Protein: 7.2 g/dL (ref 6.5–8.1)

## 2024-02-29 LAB — CBC
HCT: 41.8 % (ref 36.0–46.0)
Hemoglobin: 13.9 g/dL (ref 12.0–15.0)
MCH: 32.5 pg (ref 26.0–34.0)
MCHC: 33.3 g/dL (ref 30.0–36.0)
MCV: 97.7 fL (ref 80.0–100.0)
Platelets: 274 K/uL (ref 150–400)
RBC: 4.28 MIL/uL (ref 3.87–5.11)
RDW: 13 % (ref 11.5–15.5)
WBC: 4.8 K/uL (ref 4.0–10.5)
nRBC: 0 % (ref 0.0–0.2)

## 2024-02-29 LAB — TSH: TSH: 2.079 u[IU]/mL (ref 0.350–4.500)

## 2024-02-29 MED ORDER — LACTULOSE 10 GM/15ML PO SOLN: 20.0000 g | Freq: Two times a day (BID) | ORAL | 0 refills | Status: AC | PRN

## 2024-02-29 NOTE — Discharge Instructions (Addendum)
 We have drawn blood to check your blood counts, thyroid  function, electrolytes and kidney and liver function numbers.  Our staff will notify you if anything is significantly abnormal.  Check your blood pressure once or twice a day or if you feel bad.  If you feel any worse with more pronounced dizziness or weakness or any abdominal pain or vomiting or bleeding, please go to the emergency room for further evaluation.  Lactulose --30 mL by mouth 2 times daily as needed for constipation.  Only start taking this if your constipation becomes a problem again.  Please follow-up with your primary care about these bowel habit changes you have experienced

## 2024-02-29 NOTE — ED Notes (Addendum)
 Patient making frequent trips to bathroom, difficult to complete tasks

## 2024-02-29 NOTE — ED Triage Notes (Signed)
 Pt reports constipation for over 2 weeks. Tried diet change. Tried taking fiber supplement, Stool softeners, miralax. Let her PCP know and they recommended mag citrate which helped for 2 doses then back to no BMs.

## 2024-02-29 NOTE — ED Notes (Signed)
Patient returned from bathroom

## 2024-02-29 NOTE — ED Notes (Signed)
 Notified dr vonna of orthostatic completion. Patient reports feeling slight dizziness and is having frequent bowel movements while in department.

## 2024-02-29 NOTE — ED Provider Notes (Signed)
 [facility]    CSN: [id number] Arrival date & time: 02/29/24  9078      History   Chief Complaint Chief Complaint  Patient presents with   appt 930    HPI Marie Walker is a 73 y.o. female.   HPI Here for constipation.  About 3 weeks ago she began struggling to have a normal bowel movement.  About 2 weeks ago she called into her primary care and it was recommended that she use mag citrate.  That gave her just a small bowel movement relief, but then she went back to being constipated.  She has been taking MiraLAX daily for about 2 weeks.  She has tried an enema and did not get any results.  Finally this morning she has had a bowel movement that is a little loose and dark brown.  No blood in the stool.   no abdominal pain and no nausea or vomiting   No fever or chills or respiratory symptoms.  She did have black stools in June, and she relates that she had begun taking some iron pills at that point and has since stopped and the black stools resolved.  On review of care everywhere she had blood work done in May of this year which showed a normal iron level and normal hemoglobin.  Last colonoscopy was a little over 4 years ago. 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   Pain in right elbow 04/19/2023   Asymptomatic microscopic hematuria 12/15/2022   Acute gout involving toe of left foot 06/08/2021   Asthma without status asthmaticus 06/08/2021   Attention deficit disorder (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 06/08/2021   Chronic gouty arthritis 06/08/2021   Essential hypertension 06/08/2021   Fibromyalgia 06/08/2021   Gastroesophageal reflux disease without esophagitis 06/08/2021    Generalized anxiety disorder 06/08/2021   Gout attack 06/08/2021   Hearing loss 06/08/2021   Muscular fasciculation 06/08/2021   Obesity 06/08/2021   Otitis externa 06/08/2021   Primary localized osteoarthritis of pelvic region and thigh 06/08/2021   Pure hypercholesterolemia 06/08/2021   Seasonal allergic rhinitis 06/08/2021   Lumbar spondylosis 02/23/2021   TIA (transient ischemic attack) 01/29/2021   Low back pain 01/19/2021   Acquired hallux rigidus 07/10/2019   Closed fracture of base of fifth metatarsal bone 07/10/2019   Pain in left knee 07/10/2019   Dyslipidemia 04/24/2014   Family history of premature CAD 04/24/2014    Past Surgical History:  Procedure Laterality Date   BREAST BIOPSY  1980   REPLACEMENT TOTAL KNEE BILATERAL     right-2019, left-2009   right foot fracture      TONSILLECTOMY  1954    OB History   No obstetric history on file.      Home Medications    Prior to Admission medications   Medication Sig Start Date End Date Taking? Authorizing Provider  lactulose  (CHRONULAC ) 10 GM/15ML solution Take 30 mLs (20 g total) by mouth 2 (two) times daily as needed for mild constipation. 02/29/24  Yes Adileny Delon K, MD  ADDERALL XR 15 MG 24 hr capsule 1 capsule  in the morning Orally Once a day for 90 days 02/28/23   [provider]  albuterol (VENTOLIN HFA) 108 (90 Base) MCG/ACT inhaler every 4 (four) hours as needed.  11/05/19   [provider]  amLODipine (NORVASC) 10 MG tablet amlodipine 10 mg tablet 09/03/19   [provider]  aspirin 81 MG EC tablet Take by mouth once. Tablet 09/01/19   [provider]  atorvastatin  (LIPITOR) 40 MG tablet Take 60 mg by mouth daily.    [provider]  B COMPLEX VITAMINS ER PO Take by mouth.    [provider]  CALCIUM  PO Take by mouth.    [provider]  cetirizine  (ZYRTEC ) 10 MG chewable tablet     [provider]  cetirizine  (ZYRTEC ) 10 MG tablet  TAKE 1 TABLET BY MOUTH EVERY DAY. OTC NOT COVERED 12/30/22   Desai, Nikita S, MD  Ergocalciferol 10 MCG (400 UNIT) TABS Take 1,000 mcg by mouth daily. 01/29/21   [provider]  finasteride (PROSCAR) 5 MG tablet Take 2.5 mg by mouth daily. 08/10/23   [provider]  fluticasone  (FLONASE ) 50 MCG/ACT nasal spray Place 1 spray into both nostrils daily. 01/31/22   Desai, Nikita S, MD  hydrOXYzine (ATARAX) 25 MG tablet 1/2-1 tablet Orally every 8 hours as needed for anxiety or itching    [provider]  Magnesium 250 MG TABS 1 tablet with a meal Orally Once a day    [provider]  montelukast  (SINGULAIR ) 10 MG tablet TAKE 1 TABLET BY MOUTH EVERYDAY AT BEDTIME 03/31/23   Desai, Nikita S, MD  Multiple Vitamin (MULTIVITAMIN) tablet Take 1 tablet by mouth daily.    [provider]  pantoprazole (PROTONIX) 40 MG tablet Take 40 mg by mouth daily. 12/19/19   [provider]  traZODone (DESYREL) 50 MG tablet 1 tablet at bedtime as needed Orally Once a day 10/27/23   [provider]    Family History Family History  Problem Relation Age of Onset   Hypertension Mother    Hyperlipidemia Mother    Hearing loss Mother    Cancer Mother    Arthritis Mother    Heart attack Mother    Stroke Father    Heart attack Father    Hearing loss Father    Cancer Father    Alzheimer's disease Father    Depression Sister    Stroke Brother    Hypertension Brother    Arthritis Brother    Depression Brother    Heart attack Maternal Grandmother    Hearing loss Maternal Grandfather    Cancer Paternal Grandmother    Hearing loss Paternal Grandmother    Alcohol abuse Paternal Grandmother    Vision loss Paternal Grandmother    Early death Paternal Grandfather    Lung disease Neg Hx     Social History Social History   Tobacco Use   Smoking status: Never   Smokeless tobacco: Never  Vaping Use   Vaping status: Never Used  Substance Use Topics   Alcohol  use: Yes    Alcohol/week: 2.0 standard drinks of alcohol    Types: 1 Glasses of wine, 1 Shots of liquor per week    Comment: ocassionally   Drug use: Not Currently     Allergies   Doxycycline, Molds & smuts, Morphine, Orphenadrine, Oxycodone-acetaminophen, Augmentin [amoxicillin-pot clavulanate], Orphenadrine citrate, Other, and Sulfamethoxazole -trimethoprim    Review of Systems Review of Systems   Physical Exam Triage Vital Signs ED Triage  Vitals  Encounter Vitals Group     BP 02/29/24 0936 109/77     Girls Systolic BP Percentile --      Girls Diastolic BP Percentile --      Boys Systolic BP Percentile --      Boys Diastolic BP Percentile --      Pulse Rate 02/29/24 0936 (!) 105     Resp 02/29/24 0936 18     Temp 02/29/24 0936 97.6 F (36.4 C)     Temp Source 02/29/24 0936 Oral     SpO2 02/29/24 0936 96 %     Weight --      Height --      Head Circumference --      Peak Flow --      Pain Score 02/29/24 0935 0     Pain Loc --      Pain Education --      Exclude from Growth Chart --    Orthostatic VS for the past 24 hrs:  BP- Lying Pulse- Lying BP- Sitting Pulse- Sitting BP- Standing at 0 minutes Pulse- Standing at 0 minutes  02/29/24 1036 135/82 89 134/84 95 111/78 100    Updated Vital Signs BP 109/77 (BP Location: Left Arm)   Pulse (!) 105   Temp 97.6 F (36.4 C) (Oral)   Resp 18   SpO2 96%   Visual Acuity Right Eye Distance:   Left Eye Distance:   Bilateral Distance:    Right Eye Near:   Left Eye Near:    Bilateral Near:     Physical Exam Vitals reviewed.  Constitutional:      General: She is not in acute distress.    Appearance: She is not ill-appearing, toxic-appearing or diaphoretic.  HENT:     Mouth/Throat:     Mouth: Mucous membranes are moist.     Pharynx: No oropharyngeal exudate or posterior oropharyngeal erythema.  Eyes:     Extraocular Movements: Extraocular movements intact.     Conjunctiva/sclera: Conjunctivae normal.     Pupils:  Pupils are equal, round, and reactive to light.  Cardiovascular:     Rate and Rhythm: Regular rhythm. Tachycardia present.     Heart sounds: No murmur heard. Pulmonary:     Effort: Pulmonary effort is normal.     Breath sounds: Normal breath sounds.  Abdominal:     General: Bowel sounds are normal. There is no distension.     Palpations: Abdomen is soft.     Tenderness: There is no abdominal tenderness. There is no guarding.  Musculoskeletal:     Cervical back: Neck supple.  Lymphadenopathy:     Cervical: No cervical adenopathy.  Skin:    Coloration: Skin is not jaundiced or pale.  Neurological:     General: No focal deficit present.     Mental Status: She is alert and oriented to person, place, and time.  Psychiatric:        Behavior: Behavior normal.      UC Treatments / Results  Labs (all labs ordered are listed, but only abnormal results are displayed) Labs Reviewed  CBC  COMPREHENSIVE METABOLIC PANEL WITH GFR  TSH    EKG   Radiology No results found.  Procedures Procedures (including critical care time)  Medications Ordered in UC Medications - No data to display  Initial Impression / Assessment and Plan / UC Course  I have reviewed the triage vital signs and the nursing notes.  Pertinent labs & imaging results that  were available during my care of the patient were reviewed by me and considered in my medical decision making (see chart for details).     Systolic blood pressures 20-30 points lower usual, so orthostatics were done.  Blood pressure did drop from 135-109.  Her heart rate went up about 15 points.  So her orthostatics were borderline.  She states she really does not feel very dizzy and maybe felt a tad dizzy when she went from lying to standing.  She has eaten a little bit for breakfast but has been having a lot of bowel movement this morning when she got to the clinic.  We decided to do lab work here.  She will follow her blood pressure and  go to the emergency room if she feels any worse.  Lactulose  is sent in in case she needs it but it sounds like the constipation is actually relieved.  I have asked her to follow-up with her primary care about these bowel habit changes. Final Clinical Impressions(s) / UC Diagnoses   Final diagnoses:  Constipation, unspecified constipation type     Discharge Instructions      We have drawn blood to check your blood counts, thyroid  function, electrolytes and kidney and liver function numbers.  Our staff will notify you if anything is significantly abnormal.  Check your blood pressure once or twice a day or if you feel bad.  If you feel any worse with more pronounced dizziness or weakness or any abdominal pain or vomiting or bleeding, please go to the emergency room for further evaluation.  Lactulose --30 mL by mouth 2 times daily as needed for constipation.  Only start taking this if your constipation becomes a problem again.  Please follow-up with your primary care about these bowel habit changes you have experienced     ED Prescriptions     Medication Sig Dispense Auth. Provider   lactulose  (CHRONULAC ) 10 GM/15ML solution Take 30 mLs (20 g total) by mouth 2 (two) times daily as needed for mild constipation. 236 mL Vonna Sharlet POUR, MD      I have reviewed the PDMP during this encounter.   Vonna Sharlet POUR, MD 02/29/24 1048

## 2024-03-01 ENCOUNTER — Ambulatory Visit (HOSPITAL_COMMUNITY): Payer: Self-pay

## 2024-07-07 ENCOUNTER — Other Ambulatory Visit (HOSPITAL_BASED_OUTPATIENT_CLINIC_OR_DEPARTMENT_OTHER): Payer: Self-pay | Admitting: Family Medicine

## 2024-07-07 DIAGNOSIS — Z1231 Encounter for screening mammogram for malignant neoplasm of breast: Secondary | ICD-10-CM

## 2024-07-07 DIAGNOSIS — E2839 Other primary ovarian failure: Secondary | ICD-10-CM
